# Patient Record
Sex: Female | Born: 1935 | Race: White | Hispanic: No | Smoking: Never smoker
Health system: Southern US, Community
[De-identification: ages and names within clinical notes are randomized; demographics above are authoritative.]

## PROBLEM LIST (undated history)

## (undated) DIAGNOSIS — E039 Hypothyroidism, unspecified: Secondary | ICD-10-CM

## (undated) DIAGNOSIS — I10 Essential (primary) hypertension: Secondary | ICD-10-CM

## (undated) DIAGNOSIS — I509 Heart failure, unspecified: Secondary | ICD-10-CM

## (undated) DIAGNOSIS — K219 Gastro-esophageal reflux disease without esophagitis: Secondary | ICD-10-CM

## (undated) DIAGNOSIS — N289 Disorder of kidney and ureter, unspecified: Secondary | ICD-10-CM

## (undated) HISTORY — PX: REPLACEMENT TOTAL KNEE: SUR1224

## (undated) HISTORY — PX: JOINT REPLACEMENT: SHX530

---

## 2010-08-13 ENCOUNTER — Emergency Department (HOSPITAL_BASED_OUTPATIENT_CLINIC_OR_DEPARTMENT_OTHER)
Admission: EM | Admit: 2010-08-13 | Discharge: 2010-08-13 | Payer: Self-pay | Source: Home / Self Care | Admitting: Emergency Medicine

## 2010-11-14 LAB — URINALYSIS, ROUTINE W REFLEX MICROSCOPIC
Bilirubin Urine: NEGATIVE
Glucose, UA: NEGATIVE mg/dL
Ketones, ur: NEGATIVE mg/dL
Nitrite: POSITIVE — AB
Protein, ur: NEGATIVE mg/dL
Urobilinogen, UA: 0.2 mg/dL (ref 0.0–1.0)
pH: 5.5 (ref 5.0–8.0)

## 2010-11-14 LAB — URINE CULTURE
Colony Count: >=100000
Culture  Setup Time: 201112120955

## 2011-05-09 ENCOUNTER — Encounter: Payer: Self-pay | Admitting: *Deleted

## 2011-05-09 ENCOUNTER — Other Ambulatory Visit: Payer: Self-pay

## 2011-05-09 ENCOUNTER — Emergency Department (HOSPITAL_BASED_OUTPATIENT_CLINIC_OR_DEPARTMENT_OTHER)
Admission: EM | Admit: 2011-05-09 | Discharge: 2011-05-09 | Disposition: A | Discharge status: Other Acute Inpatient Hospital | Payer: Medicare Other | Attending: Emergency Medicine | Admitting: Emergency Medicine

## 2011-05-09 ENCOUNTER — Emergency Department (INDEPENDENT_AMBULATORY_CARE_PROVIDER_SITE_OTHER): Payer: Medicare Other

## 2011-05-09 DIAGNOSIS — M545 Low back pain, unspecified: Secondary | ICD-10-CM | POA: Insufficient documentation

## 2011-05-09 DIAGNOSIS — R0989 Other specified symptoms and signs involving the circulatory and respiratory systems: Secondary | ICD-10-CM

## 2011-05-09 DIAGNOSIS — E039 Hypothyroidism, unspecified: Secondary | ICD-10-CM | POA: Insufficient documentation

## 2011-05-09 DIAGNOSIS — I10 Essential (primary) hypertension: Secondary | ICD-10-CM | POA: Insufficient documentation

## 2011-05-09 DIAGNOSIS — R6883 Chills (without fever): Secondary | ICD-10-CM

## 2011-05-09 DIAGNOSIS — R110 Nausea: Secondary | ICD-10-CM

## 2011-05-09 DIAGNOSIS — R109 Unspecified abdominal pain: Secondary | ICD-10-CM

## 2011-05-09 DIAGNOSIS — R509 Fever, unspecified: Secondary | ICD-10-CM

## 2011-05-09 DIAGNOSIS — N390 Urinary tract infection, site not specified: Secondary | ICD-10-CM | POA: Insufficient documentation

## 2011-05-09 DIAGNOSIS — K5730 Diverticulosis of large intestine without perforation or abscess without bleeding: Secondary | ICD-10-CM

## 2011-05-09 DIAGNOSIS — R05 Cough: Secondary | ICD-10-CM

## 2011-05-09 DIAGNOSIS — R1084 Generalized abdominal pain: Secondary | ICD-10-CM

## 2011-05-09 DIAGNOSIS — I517 Cardiomegaly: Secondary | ICD-10-CM

## 2011-05-09 HISTORY — DX: Essential (primary) hypertension: I10

## 2011-05-09 HISTORY — DX: Hypothyroidism, unspecified: E03.9

## 2011-05-09 LAB — URINALYSIS, ROUTINE W REFLEX MICROSCOPIC
Glucose, UA: NEGATIVE mg/dL
Hgb urine dipstick: NEGATIVE
Leukocytes, UA: NEGATIVE
Protein, ur: NEGATIVE mg/dL
Specific Gravity, Urine: 1.016 (ref 1.005–1.030)
Urobilinogen, UA: 0.2 mg/dL (ref 0.0–1.0)
pH: 5.5 (ref 5.0–8.0)

## 2011-05-09 LAB — COMPREHENSIVE METABOLIC PANEL
BUN: 27 mg/dL — ABNORMAL HIGH (ref 6–23)
Chloride: 100 mEq/L (ref 96–112)
GFR calc Af Amer: 31 mL/min — ABNORMAL LOW (ref >60)
Glucose, Bld: 90 mg/dL (ref 70–99)
Potassium: 4.9 mEq/L (ref 3.5–5.1)
Sodium: 134 mEq/L — ABNORMAL LOW (ref 135–145)
Total Bilirubin: 0.4 mg/dL (ref 0.3–1.2)
Total Protein: 7.9 g/dL (ref 6.0–8.3)

## 2011-05-09 LAB — CBC
Hemoglobin: 13 g/dL (ref 12.0–15.0)
MCH: 34.4 pg — ABNORMAL HIGH (ref 26.0–34.0)
MCHC: 33.2 g/dL (ref 30.0–36.0)
RBC: 3.78 MIL/uL — ABNORMAL LOW (ref 3.87–5.11)
RDW: 13.6 % (ref 11.5–15.5)

## 2011-05-09 LAB — LACTIC ACID, PLASMA: Lactic Acid, Venous: 0.7 mmol/L (ref 0.5–2.2)

## 2011-05-09 MED ORDER — ONDANSETRON HCL 4 MG/2ML IJ SOLN
INTRAMUSCULAR | Status: AC
  Filled 2011-05-09: qty 2

## 2011-05-09 MED ORDER — ONDANSETRON HCL 4 MG/2ML IJ SOLN: 4.0000 mg | Freq: Once | INTRAMUSCULAR | Status: DC

## 2011-05-09 MED ORDER — ONDANSETRON HCL 4 MG/2ML IJ SOLN
4.0000 mg | Freq: Once | INTRAMUSCULAR | Status: AC
  Administered 2011-05-09: 4 mg via INTRAVENOUS

## 2011-05-09 NOTE — ED Notes (Signed)
Pt and family aware of transfer and [facility] en route

## 2011-05-09 NOTE — ED Notes (Signed)
Pt to room 12 by ems via stretcher, daughter reports pt has long hx of uti, has taken many atbx for same, and that she starts taking the atbx, then stops when she feels better. Pt is a/a/o x3 in nad.  Pt reports her usual uti sx x Sunday, with dysuria, low grade temps and back pain.

## 2011-05-09 NOTE — ED Provider Notes (Addendum)
History     CSN: [id number] Arrival date & time: 05/09/2011  6:09 PM  Chief Complaint  Patient presents with  . Urinary Tract Infection   HPI Comments: Patient has history of hypertension, is presenting from nursing home with 3 days of not feeling well. Patient's family states she has recurrent UTIs and was last treated on August 28 patient stopped antibiotics prematurely. She feels like she is having a urinary tract infection. She has chills but no documented fever. She is a poor appetite with nausea but denies any vomiting or diarrhea. She denies any chest pain or shortness of breath. She has chronic lower extremity edema and erythema which she says is unchanged.  No chest pain or shortness of breath. She does have chronic low back pain it is also unchanged. There is no CVA tenderness. He has no bowel or bladder incontinence, weakness, numbness or tingling.  The history is provided by the patient.    Past Medical History  Diagnosis Date  . Hypothyroid   . Hypertension     Past Surgical History  Procedure Date  . Replacement total knee     History reviewed. No pertinent family history.  History  Substance Use Topics  . Smoking status: Not on file  . Smokeless tobacco: Not on file  . Alcohol Use:     OB History    Grav Para Term Preterm Abortions TAB SAB Ect Mult Living                  Review of Systems  Constitutional: Positive for chills, activity change, appetite change and fatigue. Negative for fever.  HENT: Negative for congestion and rhinorrhea.   Eyes: Negative.   Respiratory: Negative for cough, chest tightness and shortness of breath.   Cardiovascular: Negative for chest pain.  Gastrointestinal: Positive for nausea. Negative for vomiting and abdominal pain.  Genitourinary: Negative for dysuria, hematuria, vaginal bleeding and vaginal pain.  Musculoskeletal: Positive for myalgias, back pain and arthralgias.  Neurological: Negative for headaches.    Hematological: Negative.     Physical Exam  BP 186/62  Pulse 81  Temp(Src) 98.3 F (36.8 C) (Oral)  Resp 18  SpO2 99%  Physical Exam  Constitutional: She is oriented to person, place, and time. She appears well-developed and well-nourished. No distress.  HENT:  Head: Normocephalic and atraumatic.  Mouth/Throat: Oropharynx is clear and moist. No oropharyngeal exudate.  Eyes: Conjunctivae are normal. Pupils are equal, round, and reactive to light.  Neck: Normal range of motion.  Cardiovascular: Normal rate, regular rhythm and normal heart sounds.   Pulmonary/Chest: Effort normal and breath sounds normal. No respiratory distress.  Abdominal: Soft. There is tenderness. There is no rebound and no guarding.  Musculoskeletal: She exhibits edema and tenderness.       Bilateral lower extremity lymphedema with chronic venous stasis changes and erythema. +2 DP and PT pulses  Neurological: She is alert and oriented to person, place, and time. No cranial nerve deficit.  Skin: Skin is warm.    ED Course  Procedures  MDM Chills, weakness, nausea, abdominal pain.  Concern for UTI.  Mental status normal, vital signs normal.  UA appears benign, culture sent.  No metabolic abnormality.   Patient continues not to feel well, states "stomach churning" and mildly tender to palpation.  Will obtain CT.  Stranding around pancreas on CT, concern for pancreatitis or duodenitis.  Lipase wnl.  Patient still with abdominal pain, chills, feeling ill.  Vitals stable and appears nontoxic.  Mild ARF.  D/w hospitalist at [facility] for admission and observation.   Date: 05/09/2011  Rate: 77  Rhythm: normal sinus rhythm  QRS Axis: normal  Intervals: normal  ST/T Wave abnormalities: normal  Conduction Disutrbances:none  Narrative Interpretation:   Old EKG Reviewed: none available  Results for orders placed during the hospital encounter of 05/09/11  URINALYSIS, ROUTINE W REFLEX MICROSCOPIC       Component Value Range   Color, Urine YELLOW  YELLOW    Appearance CLEAR  CLEAR    Specific Gravity, Urine 1.016  1.005 - 1.030    pH 5.5  5.0 - 8.0    Glucose, UA NEGATIVE  NEGATIVE (mg/dL)   Hgb urine dipstick NEGATIVE  NEGATIVE    Bilirubin Urine NEGATIVE  NEGATIVE    Ketones, ur 15 (*) NEGATIVE (mg/dL)   Protein, ur NEGATIVE  NEGATIVE (mg/dL)   Urobilinogen, UA 0.2  0.0 - 1.0 (mg/dL)   Nitrite NEGATIVE  NEGATIVE    Leukocytes, UA NEGATIVE  NEGATIVE   CBC      Component Value Range   WBC 7.4  4.0 - 10.5 (K/uL)   RBC 3.78 (*) 3.87 - 5.11 (MIL/uL)   Hemoglobin 13.0  12.0 - 15.0 (g/dL)   HCT 39.1  36.0 - 46.0 (%)   MCV 103.4 (*) 78.0 - 100.0 (fL)   MCH 34.4 (*) 26.0 - 34.0 (pg)   MCHC 33.2  30.0 - 36.0 (g/dL)   RDW 13.6  11.5 - 15.5 (%)   Platelets 239  150 - 400 (K/uL)  COMPREHENSIVE METABOLIC PANEL      Component Value Range   Sodium 134 (*) 135 - 145 (mEq/L)   Potassium 4.9  3.5 - 5.1 (mEq/L)   Chloride 100  96 - 112 (mEq/L)   CO2 21  19 - 32 (mEq/L)   Glucose, Bld 90  70 - 99 (mg/dL)   BUN 27 (*) 6 - 23 (mg/dL)   Creatinine, Ser 1.90 (*) 0.50 - 1.10 (mg/dL)   Calcium 10.3  8.4 - 10.5 (mg/dL)   Total Protein 7.9  6.0 - 8.3 (g/dL)   Albumin 3.4 (*) 3.5 - 5.2 (g/dL)   AST 29  0 - 37 (U/L)   ALT 5  0 - 35 (U/L)   Alkaline Phosphatase 98  39 - 117 (U/L)   Total Bilirubin 0.4  0.3 - 1.2 (mg/dL)   GFR calc non Af Amer 25 (*) >60 (mL/min)   GFR calc Af Amer 31 (*) >60 (mL/min)  LACTIC ACID, PLASMA      Component Value Range   Lactic Acid, Venous 0.7  0.5 - 2.2 (mmol/L)  TROPONIN I      Component Value Range   Troponin I <0.30  <0.30 (ng/mL)  LIPASE, BLOOD      Component Value Range   Lipase 20  11 - 59 (U/L)   Ct Abdomen Pelvis Wo Contrast  05/09/2011  *RADIOLOGY REPORT*  Clinical Data: Fever, abdominal pain, nausea  CT ABDOMEN AND PELVIS WITHOUT CONTRAST  Technique:  Multidetector CT imaging of the abdomen and pelvis was performed following the standard protocol  without intravenous contrast.  Comparison: None.  Findings: Sagittal images of the spine shows diffuse osteopenia. Multilevel extensive degenerative changes thoracolumbar spine. Prior vertebral plasty noted T12 level.  The study is limited without IV and oral contrast. Enhanced liver, spleen and adrenal glands are unremarkable.  Unenhanced kidneys are symmetrical in size.  No nephrolithiasis.  No hydronephrosis or hydroureter.  No  calcified ureteral calculi are noted.  There is a moderate distended gallbladder without evidence of calcified gallstones.  In axial image 28 there is mild thickening of the duodenal wall.  There is stranding of the fat between pancreatic head and duodenum in axial image 27.  Mild stranding of the peri duodenal fat and retroperitoneal fat just inferior to the pancreatic head is noted in axial image 29. There is mild thickening of duodenal wall in axial image 30.  This is confirmed in the sagittal image 65.  Findings may be due to duodenitis, ulcer disease, pancreatic head or groove pancreatitis.  Clinical correlation is necessary.  No mesenteric or retroperitoneal fluid collection is noted.  Atherosclerotic calcifications of the abdominal aorta and the iliac arteries are noted.  No small bowel obstruction.  No free abdominal air.  Multiple sigmoid colon diverticula are noted without evidence of acute diverticulitis.  There is no pericecal inflammation.  The uterus is atrophic.  The urinary bladder is unremarkable.  IMPRESSION:  1.There is stranding of the fat between pancreatic head and duodenum in axial image 27.  Mild stranding of the peri duodenal fat and retroperitoneal fat just inferior to the pancreatic head is noted in axial image 29.  This is confirmed in the sagittal image 65.  Findings may be due to duodenitis, ulcer disease, pancreatic head or groove pancreatitis.  Clinical correlation is necessary. No mesenteric or retroperitoneal fluid collection is noted. 2.  No  nephrolithiasis.  No hydronephrosis or hydroureter. 3.  Extensive generative changes thoracolumbar spine. 4.  Sigmoid colon diverticula are noted without evidence of acute diverticulitis. 5.  Moderate distended gallbladder without evidence of calcified gallstones.  Original Report Authenticated By: Lahoma Crocker, M.D.   Dg Chest 2 View  05/09/2011  *RADIOLOGY REPORT*  Clinical Data: Fever, cough, congestion  CHEST - 2 VIEW  Comparison: 08/13/2010  Findings: Cardiomegaly again noted.  No acute infiltrate or edema. Osteopenia and degenerative changes thoracic spine again noted. Again noted prior vertebroplasty lower thoracic spine.  IMPRESSION: No active disease.  No significant change.  Original Report Authenticated By: Lahoma Crocker, M.D.            Ezequiel Essex, MD 05/10/11 [place], MD 05/16/11 [phone]

## 2011-05-09 NOTE — ED Notes (Signed)
Call placed to [facility] hospitalist

## 2011-05-09 NOTE — ED Notes (Signed)
Pt states she was brought to ED for c/o nausea-pt felt warm to touch-rectal temp taken 98.4

## 2011-05-10 LAB — URINE CULTURE

## 2011-05-15 LAB — CULTURE, BLOOD (ROUTINE X 2)
Culture  Setup Time: 201209052357
Culture  Setup Time: 201209052357

## 2019-09-20 ENCOUNTER — Emergency Department (HOSPITAL_COMMUNITY): Payer: Medicare Other

## 2019-09-20 ENCOUNTER — Inpatient Hospital Stay (HOSPITAL_COMMUNITY)
Admission: EM | Admit: 2019-09-20 | Discharge: 2019-09-28 | DRG: 177 | Disposition: A | Discharge status: Skilled Nursing Facility | Payer: Medicare Other | Source: Skilled Nursing Facility | Attending: Internal Medicine | Admitting: Internal Medicine

## 2019-09-20 ENCOUNTER — Encounter (HOSPITAL_COMMUNITY): Payer: Self-pay | Admitting: Family Medicine

## 2019-09-20 ENCOUNTER — Other Ambulatory Visit: Payer: Self-pay

## 2019-09-20 DIAGNOSIS — Z8616 Personal history of COVID-19: Secondary | ICD-10-CM | POA: Diagnosis present

## 2019-09-20 DIAGNOSIS — M79662 Pain in left lower leg: Secondary | ICD-10-CM | POA: Diagnosis not present

## 2019-09-20 DIAGNOSIS — E039 Hypothyroidism, unspecified: Secondary | ICD-10-CM | POA: Diagnosis present

## 2019-09-20 DIAGNOSIS — N179 Acute kidney failure, unspecified: Secondary | ICD-10-CM | POA: Diagnosis present

## 2019-09-20 DIAGNOSIS — D72828 Other elevated white blood cell count: Secondary | ICD-10-CM | POA: Diagnosis present

## 2019-09-20 DIAGNOSIS — Z9981 Dependence on supplemental oxygen: Secondary | ICD-10-CM

## 2019-09-20 DIAGNOSIS — R0602 Shortness of breath: Secondary | ICD-10-CM | POA: Diagnosis present

## 2019-09-20 DIAGNOSIS — E875 Hyperkalemia: Secondary | ICD-10-CM | POA: Diagnosis present

## 2019-09-20 DIAGNOSIS — J189 Pneumonia, unspecified organism: Secondary | ICD-10-CM | POA: Diagnosis not present

## 2019-09-20 DIAGNOSIS — Z79890 Hormone replacement therapy (postmenopausal): Secondary | ICD-10-CM

## 2019-09-20 DIAGNOSIS — Z86711 Personal history of pulmonary embolism: Secondary | ICD-10-CM | POA: Diagnosis present

## 2019-09-20 DIAGNOSIS — I130 Hypertensive heart and chronic kidney disease with heart failure and stage 1 through stage 4 chronic kidney disease, or unspecified chronic kidney disease: Secondary | ICD-10-CM | POA: Diagnosis present

## 2019-09-20 DIAGNOSIS — T380X5A Adverse effect of glucocorticoids and synthetic analogues, initial encounter: Secondary | ICD-10-CM | POA: Diagnosis present

## 2019-09-20 DIAGNOSIS — D649 Anemia, unspecified: Secondary | ICD-10-CM | POA: Diagnosis present

## 2019-09-20 DIAGNOSIS — Z79899 Other long term (current) drug therapy: Secondary | ICD-10-CM

## 2019-09-20 DIAGNOSIS — Z880 Allergy status to penicillin: Secondary | ICD-10-CM

## 2019-09-20 DIAGNOSIS — N898 Other specified noninflammatory disorders of vagina: Secondary | ICD-10-CM | POA: Diagnosis present

## 2019-09-20 DIAGNOSIS — Z66 Do not resuscitate: Secondary | ICD-10-CM | POA: Diagnosis present

## 2019-09-20 DIAGNOSIS — U071 COVID-19: Principal | ICD-10-CM | POA: Diagnosis present

## 2019-09-20 DIAGNOSIS — Z87440 Personal history of urinary (tract) infections: Secondary | ICD-10-CM | POA: Diagnosis not present

## 2019-09-20 DIAGNOSIS — I2699 Other pulmonary embolism without acute cor pulmonale: Secondary | ICD-10-CM | POA: Diagnosis present

## 2019-09-20 DIAGNOSIS — Z885 Allergy status to narcotic agent status: Secondary | ICD-10-CM

## 2019-09-20 DIAGNOSIS — J9600 Acute respiratory failure, unspecified whether with hypoxia or hypercapnia: Secondary | ICD-10-CM | POA: Diagnosis present

## 2019-09-20 DIAGNOSIS — J9601 Acute respiratory failure with hypoxia: Secondary | ICD-10-CM | POA: Diagnosis present

## 2019-09-20 DIAGNOSIS — R0902 Hypoxemia: Secondary | ICD-10-CM

## 2019-09-20 DIAGNOSIS — N170 Acute kidney failure with tubular necrosis: Secondary | ICD-10-CM | POA: Diagnosis not present

## 2019-09-20 DIAGNOSIS — R7989 Other specified abnormal findings of blood chemistry: Secondary | ICD-10-CM | POA: Diagnosis not present

## 2019-09-20 DIAGNOSIS — N184 Chronic kidney disease, stage 4 (severe): Secondary | ICD-10-CM | POA: Diagnosis present

## 2019-09-20 DIAGNOSIS — Z7982 Long term (current) use of aspirin: Secondary | ICD-10-CM

## 2019-09-20 DIAGNOSIS — N390 Urinary tract infection, site not specified: Secondary | ICD-10-CM | POA: Diagnosis present

## 2019-09-20 DIAGNOSIS — Z96652 Presence of left artificial knee joint: Secondary | ICD-10-CM | POA: Diagnosis present

## 2019-09-20 DIAGNOSIS — N189 Chronic kidney disease, unspecified: Secondary | ICD-10-CM | POA: Diagnosis present

## 2019-09-20 DIAGNOSIS — I5032 Chronic diastolic (congestive) heart failure: Secondary | ICD-10-CM | POA: Diagnosis present

## 2019-09-20 DIAGNOSIS — M79661 Pain in right lower leg: Secondary | ICD-10-CM | POA: Diagnosis not present

## 2019-09-20 DIAGNOSIS — I959 Hypotension, unspecified: Secondary | ICD-10-CM | POA: Diagnosis present

## 2019-09-20 DIAGNOSIS — I10 Essential (primary) hypertension: Secondary | ICD-10-CM | POA: Diagnosis present

## 2019-09-20 DIAGNOSIS — J1282 Pneumonia due to coronavirus disease 2019: Secondary | ICD-10-CM | POA: Diagnosis present

## 2019-09-20 DIAGNOSIS — Z881 Allergy status to other antibiotic agents status: Secondary | ICD-10-CM

## 2019-09-20 LAB — URINALYSIS, ROUTINE W REFLEX MICROSCOPIC
Bilirubin Urine: NEGATIVE
Glucose, UA: NEGATIVE mg/dL
Hgb urine dipstick: NEGATIVE
Ketones, ur: NEGATIVE mg/dL
Nitrite: NEGATIVE
Protein, ur: NEGATIVE mg/dL
Specific Gravity, Urine: 1.013 (ref 1.005–1.030)
WBC, UA: >50 WBC/hpf — ABNORMAL HIGH (ref 0–5)
pH: 5 (ref 5.0–8.0)

## 2019-09-20 LAB — COMPREHENSIVE METABOLIC PANEL
ALT: 14 U/L (ref 0–44)
AST: 20 U/L (ref 15–41)
Albumin: 3 g/dL — ABNORMAL LOW (ref 3.5–5.0)
Alkaline Phosphatase: 72 U/L (ref 38–126)
Anion gap: 9 (ref 5–15)
BUN: 87 mg/dL — ABNORMAL HIGH (ref 8–23)
CO2: 21 mmol/L — ABNORMAL LOW (ref 22–32)
Calcium: 8.8 mg/dL — ABNORMAL LOW (ref 8.9–10.3)
Chloride: 103 mmol/L (ref 98–111)
Creatinine, Ser: 4.12 mg/dL — ABNORMAL HIGH (ref 0.44–1.00)
GFR calc Af Amer: 10 mL/min — ABNORMAL LOW (ref >60)
GFR calc non Af Amer: 9 mL/min — ABNORMAL LOW (ref >60)
Glucose, Bld: 131 mg/dL — ABNORMAL HIGH (ref 70–99)
Potassium: 5.9 mmol/L — ABNORMAL HIGH (ref 3.5–5.1)
Sodium: 133 mmol/L — ABNORMAL LOW (ref 135–145)
Total Bilirubin: 0.4 mg/dL (ref 0.3–1.2)
Total Protein: 6.6 g/dL (ref 6.5–8.1)

## 2019-09-20 LAB — I-STAT CHEM 8, ED
BUN: 86 mg/dL — ABNORMAL HIGH (ref 8–23)
Calcium, Ion: 1.22 mmol/L (ref 1.15–1.40)
Chloride: 105 mmol/L (ref 98–111)
Creatinine, Ser: 4.5 mg/dL — ABNORMAL HIGH (ref 0.44–1.00)
Glucose, Bld: 126 mg/dL — ABNORMAL HIGH (ref 70–99)
HCT: 34 % — ABNORMAL LOW (ref 36.0–46.0)
Hemoglobin: 11.6 g/dL — ABNORMAL LOW (ref 12.0–15.0)
Potassium: 5.9 mmol/L — ABNORMAL HIGH (ref 3.5–5.1)
Sodium: 134 mmol/L — ABNORMAL LOW (ref 135–145)
TCO2: 24 mmol/L (ref 22–32)

## 2019-09-20 LAB — BLOOD GAS, VENOUS
Acid-base deficit: 2.4 mmol/L — ABNORMAL HIGH (ref 0.0–2.0)
Bicarbonate: 22.2 mmol/L (ref 20.0–28.0)
O2 Saturation: 85.6 %
Patient temperature: 98.6
pCO2, Ven: 40 mmHg — ABNORMAL LOW (ref 44.0–60.0)
pH, Ven: 7.364 (ref 7.250–7.430)
pO2, Ven: 49.2 mmHg — ABNORMAL HIGH (ref 32.0–45.0)

## 2019-09-20 LAB — CBC WITH DIFFERENTIAL/PLATELET
Abs Immature Granulocytes: 0.09 10*3/uL — ABNORMAL HIGH (ref 0.00–0.07)
Basophils Absolute: 0 10*3/uL (ref 0.0–0.1)
Basophils Relative: 0 %
Eosinophils Absolute: 0 10*3/uL (ref 0.0–0.5)
Eosinophils Relative: 0 %
HCT: 35.6 % — ABNORMAL LOW (ref 36.0–46.0)
Hemoglobin: 11.3 g/dL — ABNORMAL LOW (ref 12.0–15.0)
Immature Granulocytes: 1 %
Lymphocytes Relative: 4 %
Lymphs Abs: 0.4 10*3/uL — ABNORMAL LOW (ref 0.7–4.0)
MCH: 30.5 pg (ref 26.0–34.0)
MCHC: 31.7 g/dL (ref 30.0–36.0)
MCV: 96 fL (ref 80.0–100.0)
Monocytes Absolute: 0.2 10*3/uL (ref 0.1–1.0)
Monocytes Relative: 2 %
Neutro Abs: 9.9 10*3/uL — ABNORMAL HIGH (ref 1.7–7.7)
Neutrophils Relative %: 93 %
Platelets: 304 10*3/uL (ref 150–400)
RBC: 3.71 MIL/uL — ABNORMAL LOW (ref 3.87–5.11)
RDW: 14.5 % (ref 11.5–15.5)
WBC: 10.6 10*3/uL — ABNORMAL HIGH (ref 4.0–10.5)
nRBC: 0 % (ref 0.0–0.2)

## 2019-09-20 LAB — PROCALCITONIN: Procalcitonin: 1.33 ng/mL

## 2019-09-20 LAB — FERRITIN: Ferritin: 189 ng/mL (ref 11–307)

## 2019-09-20 LAB — D-DIMER, QUANTITATIVE: D-Dimer, Quant: 8.95 ug/mL-FEU — ABNORMAL HIGH (ref 0.00–0.50)

## 2019-09-20 LAB — TROPONIN I (HIGH SENSITIVITY): Troponin I (High Sensitivity): 14 ng/L (ref <18)

## 2019-09-20 LAB — C-REACTIVE PROTEIN: CRP: 4.7 mg/dL — ABNORMAL HIGH (ref <1.0)

## 2019-09-20 LAB — POC SARS CORONAVIRUS 2 AG -  ED: SARS Coronavirus 2 Ag: NEGATIVE

## 2019-09-20 LAB — TRIGLYCERIDES: Triglycerides: 126 mg/dL (ref <150)

## 2019-09-20 LAB — FIBRINOGEN: Fibrinogen: 424 mg/dL (ref 210–475)

## 2019-09-20 LAB — LACTIC ACID, PLASMA: Lactic Acid, Venous: 0.9 mmol/L (ref 0.5–1.9)

## 2019-09-20 LAB — LACTATE DEHYDROGENASE: LDH: 168 U/L (ref 98–192)

## 2019-09-20 MED ORDER — SODIUM CHLORIDE 0.9 % IV SOLN
1.0000 g | Freq: Three times a day (TID) | INTRAVENOUS | Status: DC
  Administered 2019-09-20: 1 g via INTRAVENOUS
  Filled 2019-09-20 (×2): qty 1

## 2019-09-20 MED ORDER — SODIUM ZIRCONIUM CYCLOSILICATE 5 G PO PACK
5.0000 g | PACK | Freq: Once | ORAL | Status: AC
  Administered 2019-09-20: 5 g via ORAL
  Filled 2019-09-20: qty 1

## 2019-09-20 MED ORDER — HEPARIN SODIUM (PORCINE) 5000 UNIT/ML IJ SOLN
7500.0000 [IU] | Freq: Three times a day (TID) | INTRAMUSCULAR | Status: DC
  Administered 2019-09-21 – 2019-09-22 (×3): 7500 [IU] via SUBCUTANEOUS
  Filled 2019-09-20 (×3): qty 2

## 2019-09-20 MED ORDER — SODIUM CHLORIDE 0.9 % IV BOLUS: 500.0000 mL | Freq: Once | INTRAVENOUS | Status: DC

## 2019-09-20 MED ORDER — HYDROCODONE-ACETAMINOPHEN 5-325 MG PO TABS
1.0000 | ORAL_TABLET | Freq: Once | ORAL | Status: AC
  Administered 2019-09-20: 1 via ORAL
  Filled 2019-09-20: qty 1

## 2019-09-20 MED ORDER — HYDROCORTISONE NA SUCCINATE PF 100 MG IJ SOLR
100.0000 mg | Freq: Once | INTRAMUSCULAR | Status: AC
  Administered 2019-09-21: 100 mg via INTRAVENOUS
  Filled 2019-09-20: qty 2

## 2019-09-20 MED ORDER — SODIUM CHLORIDE 0.9 % IV BOLUS
1000.0000 mL | Freq: Once | INTRAVENOUS | Status: AC
  Administered 2019-09-20: 1000 mL via INTRAVENOUS

## 2019-09-20 NOTE — ED Triage Notes (Signed)
Per EMS - Pt coming from SNF with CC of COVID + (approx 12-14 days confirmed) and SOB. When EMS arrived pt was at 90% on RA and was placed on NRB @ 8L and now is at 97%. Pt complains of "generally not feeling well".   97.6 114 50 70 HR 14R 97% on 8L NRB

## 2019-09-20 NOTE — H&P (Signed)
History and Physical    Ozzie Mortellaro [id number] DOB: November 14, 1926 DOA: 09/20/2019  PCP: Patient, No Pcp Per   Patient coming from: [facility] facility   Chief Complaint: Malaise, fatigue, recent COVID with hypoxia, UTI   HPI: Beloved Cobia is a 86 y.o. female with medical history significant for chronic diastolic CHF, hypertension, hypothyroidism, chronic kidney disease, now presenting from her nursing facility for evaluation of malaise.  Patient was reportedly diagnosed with COVID-19 recently (more than a month ago per patient, less than a week ago per her daughter, and roughly 2 weeks ago per nursing home personnel), was started on 2 to 3 L of supplemental oxygen and steroids at the nursing home, was receiving antibiotics for a recurrent UTI, initially nitrofurantoin and then changed to [place] within the past 3 days.  Patient reports that her chronic leg swelling has recently resolved though she is having tenderness involving the bilateral legs.  She denies any chest pain, denies hemoptysis, and reports frequent cough that is only occasionally productive of scant sputum.  Suprapubic pain and dysuria has improved on the new antibiotic.  She was having diarrhea recently but reports that that has resolved.  ED Course: Upon arrival to the ED, patient is found to be afebrile, saturating mid 90s on 3 L/min of supplemental oxygen, slightly tachypneic, and with blood pressure 100/30.  EKG features sinus rhythm with short PR interval.  Chest x-ray with bibasilar opacities, left greater than right, concerning for pneumonia, as well as hazy interstitial opacities that could reflect concomitant edema.  Chemistry panel features a potassium of 5.9, bicarbonate 21, BUN 87, and creatinine 4.12.  Procalcitonin is elevated to 1.33, Covid antigen negative, and D-dimer 8.95.  CBC with mild leukocytosis and anemia.  Lactic acid reassuringly normal.  Blood and urine cultures were ordered, Covid PCR was  ordered, and the patient was given a liter of normal saline, Norco, Lokelma, and meropenem in the ED.  ED physician discussed the case with nephrology who advised that patient is a poor dialysis candidate and should be treated conservatively at [facility].  Review of Systems:  All other systems reviewed and apart from HPI, are negative.  Past Medical History:  Diagnosis Date  . Hypertension   . Hypothyroid     Past Surgical History:  Procedure Laterality Date  . REPLACEMENT TOTAL KNEE       has no history on file for tobacco, alcohol, and drug.  Allergies  Allergen Reactions  . Morphine And Related   . Cephalexin Hives, Swelling and Rash  . Penicillins Swelling and Rash    Did it involve swelling of the face/tongue/throat, SOB, or low BP? Unknown Did it involve sudden or severe rash/hives, skin peeling, or any reaction on the inside of your mouth or nose? Unknown Did you need to seek medical attention at a hospital or doctor's office? Unknown When did it last happen? Unknown If all above answers are "NO", may proceed with cephalosporin use.     History reviewed. No pertinent family history.   Prior to Admission medications   Medication Sig Start Date End Date Taking? Authorizing Provider  acetaminophen (TYLENOL) 325 MG tablet Take 650 mg by mouth every 4 (four) hours as needed for fever.   Yes [provider]  amLODipine (NORVASC) 5 MG tablet Take 5 mg by mouth daily. 09/15/19  Yes [provider]  ascorbic acid (VITAMIN C) 500 MG tablet Take 500 mg by mouth daily. 09/09/19 09/22/19 Yes [provider]  aspirin EC 81 MG tablet Take 81 mg by mouth every evening. 09/09/19 09/22/19 Yes [provider]  AZO-CRANBERRY PO Take 1 tablet by mouth 2 (two) times daily. AZO cranberry + Probiotic 250mg -30mg -50 million cell   Yes [provider]  b complex vitamins tablet Take 1 tablet by mouth daily. 09/09/19 09/22/19 Yes [provider]  calcium-vitamin D (OS-CAL 500 + D) 500-200 MG-UNIT per tablet Take 1 tablet by mouth daily.     Yes [provider]  Cholecalciferol (VITAMIN D3) 50 MCG (2000 UT) TABS Take 50 mcg by mouth daily.   Yes [provider]  clindamycin (CLEOCIN) 150 MG capsule Take 600 mg by mouth See admin instructions. 600 mg PO x1 one hour prior to all dental appointments   Yes [provider]  Febuxostat 80 MG TABS Take 80 mg by mouth daily. 09/15/19  Yes [provider]  hydrALAZINE (APRESOLINE) 25 MG tablet Take 25 mg by mouth 3 (three) times daily.   Yes [provider]  HYDROcodone-acetaminophen (NORCO/VICODIN) 5-325 MG tablet Take 1 tablet by mouth 3 (three) times daily. 09/14/19  Yes [provider]  isosorbide dinitrate (ISORDIL) 20 MG tablet Take 40 mg by mouth 2 (two) times daily. 800 and 2130 08/07/19  Yes [provider]  isosorbide dinitrate (ISORDIL) 20 MG tablet Take 20 mg by mouth daily. Midday (1330) 05/20/19  Yes [provider]  lactobacillus acidophilus (BACID) TABS tablet Take 2 tablets by mouth 2 (two) times daily with a meal.   Yes [provider]  levothyroxine (SYNTHROID, LEVOTHROID) 75 MCG tablet Take 75 mcg by mouth daily.     Yes [provider]  LORazepam (ATIVAN) 0.5 MG tablet Take 0.5 mg by mouth daily.   Yes [provider]  metoprolol succinate (TOPROL-XL) 25 MG 24 hr tablet Take 25 mg by mouth daily.   Yes [provider]  multivitamin-iron-minerals-folic acid (CENTRUM) chewable tablet Chew 1 tablet by mouth daily.   Yes [provider]  neomycin-polymyxin b-dexamethasone (MAXITROL) 3.5-10000-0.1 OINT Place 1 application into the right eye at bedtime as needed (eye swelling, eye infection).   Yes [provider]  omeprazole (PRILOSEC) 20 MG capsule Take 20 mg by mouth daily. 08/17/19  Yes [provider]  phenylephrine-shark liver  oil-mineral oil-petrolatum (PREPARATION H) 0.25-14-74.9 % rectal ointment Place 1 application rectally 4 (four) times daily as needed for hemorrhoids.   Yes [provider]  polyethylene glycol (MIRALAX / GLYCOLAX) 17 g packet Take 17 g by mouth daily as needed.   Yes [provider]  potassium chloride (KLOR-CON) 10 MEQ tablet Take 10 mEq by mouth daily.   Yes [provider]  predniSONE (DELTASONE) 50 MG tablet Take 50 mg by mouth every evening. 09/18/19 09/22/19 Yes [provider]  senna-docusate (SENNA S) 8.6-50 MG tablet Take 1 tablet by mouth 2 (two) times daily.   Yes [provider]  sulfamethoxazole-trimethoprim (BACTRIM) 400-80 MG tablet Take 1 tablet by mouth 2 (two) times daily. 09/18/19 09/25/19 Yes [provider]  torsemide (DEMADEX) 10 MG tablet Take 10 mg by mouth See admin instructions. 10 mg PO five times weekly (Tuesdays, Wednesdays, Fridays, Saturdays, and Sundays) 08/17/19  Yes [provider]  torsemide (DEMADEX) 20 MG tablet Take 20 mg by mouth 2 (two) times a week. On Mondays and Thursdays 09/15/19  Yes [provider]  traZODone (DESYREL) 50 MG tablet Take 50 mg by mouth at bedtime.   Yes  [provider]  zinc gluconate 50 MG tablet Take 50 mg by mouth daily. 09/09/19 09/22/19 Yes [provider]  nitrofurantoin, macrocrystal-monohydrate, (MACROBID) 100 MG capsule Take 100 mg by mouth 2 (two) times daily. 09/15/19   [provider]    Physical Exam: Vitals:   09/20/19 2100 09/20/19 2208 09/20/19 2230 09/20/19 2300  BP: (!) 103/35 (!) 103/43 (!) 98/45 (!) 94/42  Pulse: 70 85 68 64  Resp: (!) 21 (!) 24 19 17   Temp:      TempSrc:      SpO2: 95% 91% 96% 97%    Constitutional: NAD, calm  Eyes: PERTLA, lids and conjunctivae normal ENMT: Mucous membranes are moist. Posterior pharynx clear of any exudate or lesions.   Neck: normal, supple, no masses, no thyromegaly Respiratory:  no  wheezing, no crackles. Speaking full sentences.  Cardiovascular: S1 & S2 heard, regular rate and rhythm. No extremity edema.  Abdomen: No distension, no tenderness, soft. Bowel sounds active.  Musculoskeletal: no clubbing / cyanosis. No joint deformity upper and lower extremities.   Skin: no significant rashes, lesions, ulcers. Warm, dry, well-perfused. Neurologic: No facial asymmetry. Sensation intact. Moving all extremities.  Psychiatric: Alert and oriented to person, place, and situation. Pleasant and cooperative.    Labs and Imaging on Admission: I have personally reviewed following labs and imaging studies  CBC: Recent Labs  Lab 09/20/19 2121 09/20/19 2144  WBC 10.6*  --   NEUTROABS 9.9*  --   HGB 11.3* 11.6*  HCT 35.6* 34.0*  MCV 96.0  --   PLT 304  --    Basic Metabolic Panel: Recent Labs  Lab 09/20/19 2121 09/20/19 2144  NA 133* 134*  K 5.9* 5.9*  CL 103 105  CO2 21*  --   GLUCOSE 131* 126*  BUN 87* 86*  CREATININE 4.12* 4.50*  CALCIUM 8.8*  --    GFR: CrCl cannot be calculated (Unknown ideal weight.). Liver Function Tests: Recent Labs  Lab 09/20/19 2121  AST 20  ALT 14  ALKPHOS 72  BILITOT 0.4  PROT 6.6  ALBUMIN 3.0*   No results for input(s): LIPASE, AMYLASE in the last 168 hours. No results for input(s): AMMONIA in the last 168 hours. Coagulation Profile: No results for input(s): INR, PROTIME in the last 168 hours. Cardiac Enzymes: No results for input(s): CKTOTAL, CKMB, CKMBINDEX, TROPONINI in the last 168 hours. BNP (last 3 results) No results for input(s): PROBNP in the last 8760 hours. HbA1C: No results for input(s): HGBA1C in the last 72 hours. CBG: No results for input(s): GLUCAP in the last 168 hours. Lipid Profile: Recent Labs    09/20/19 2121  TRIG 126   Thyroid Function Tests: No results for input(s): TSH, T4TOTAL, FREET4, T3FREE, THYROIDAB in the last 72 hours. Anemia Panel: Recent Labs    09/20/19 2121  FERRITIN 189    Urine analysis:    Component Value Date/Time   COLORURINE AMBER (A) 09/20/2019 2100   APPEARANCEUR HAZY (A) 09/20/2019 2100   LABSPEC 1.013 09/20/2019 2100   PHURINE 5.0 09/20/2019 2100   GLUCOSEU NEGATIVE 09/20/2019 2100   HGBUR NEGATIVE 09/20/2019 2100   [place] NEGATIVE 09/20/2019 2100   [place] NEGATIVE 09/20/2019 2100   PROTEINUR NEGATIVE 09/20/2019 2100   UROBILINOGEN 0.2 05/09/2011 1918   NITRITE NEGATIVE 09/20/2019 2100   LEUKOCYTESUR LARGE (A) 09/20/2019 2100   Sepsis Labs: @LABRCNTIP (procalcitonin:4,lacticidven:4) )No results found for this or any previous visit (from the past 240 hour(s)).   Radiological Exams on  Admission: DG Chest Port 1 View  Result Date: 09/20/2019 CLINICAL DATA:  Shortness of breath, COVID-19 positive EXAM: PORTABLE CHEST 1 VIEW COMPARISON:  Radiograph 05/09/2011 FINDINGS: Patchy right basilar and dense retrocardiac opacities. Obscuration of left hemidiaphragm may reflect basilar consolidation or effusion. No visible pneumothorax. Biapical pleuroparenchymal scarring is seen. There is some cephalized vascularity with hazy interstitial opacities suggesting pulmonary edema. Tracheal tortuosity is similar to prior. The aorta is calcified. The remaining cardiomediastinal contours are unremarkable. No acute osseous or soft tissue abnormality. Degenerative changes are present in the imaged spine and shoulders. Telemetry leads and support devices overlie the chest. IMPRESSION: 1. Bibasilar opacities, left greater than right, worrisome for pneumonia in the setting of COVID-19. 2. Pulmonary vascular congestion and hazy interstitial opacities suggesting some concomitant edema. 3. Biapical pleuroparenchymal scarring. 4.  Aortic Atherosclerosis (ICD10-I70.0). Electronically Signed   By: Lovena Le M.D.   On: 09/20/2019 21:06    EKG: Independently reviewed. Sinus rhythm, short PR interval.   Assessment/Plan   1. Acute kidney injury superimposed on CKD  stage IV; hyperkalemia  - Presents from nursing home malaise, reporting recent COVID-19 diagnosis, currently on abx for UTI, and is found to have BUN of 87 with SCr of 4.12 and potassium 5.9  - Chart notes indicate BUN 47 and SCr 1.93 one year ago   - She recently had diarrhea, had been on Bactrim, and reports recent resolution in chronic leg edema  - ED physician discussed with nephrology who advised that patient poor HD candidate and should be [facility] with conserv[facility] at [facility]  - She was given a liter of NS and Lokelma in ED  - Continue cardiac monitoring, monitor potassium levels, continue IVF hydration, check urine chemistries, renally-dose medications, avoid nephrotoxins, repeat serum chemistries in am    2. Pneumonia; hypoxic respiratory failure; reported COVID-19  - Presents from SNF where she was reportedly diagnosed with COVID-19 though results not available; pt believes diagnosis was > 1 month ago, her daughter states <1 wk ago, and SNF personnel reported ~2 wks ago  - She has been using 2-3 Lpm supplemental O2 recently at the SNF  - CXR findings concerning for PNA, she does not appear to be in any distress on 3 Lpm in ED   - Check sputum culture, strep pneumo and legionella antigens, continue empiric antibiotic, and continue isolation for now while trying to clarify timeline of COVID-19 diagnosis   3. Recurrent UTI  - She has had recurrent UTI's, has seen urology outpatient, per SNF paperwork, appears she was recently on nitrofurantoin and then switched to Bactrim  - She has penicillin and cephalosporin allergy listed and was treated with meropenem in ED  - Discussed with pharmacy, will continue meropenem for now   4. Elevated d-dimer  - D-dimer is 8.95 in ED  - She denies chest pain or hemoptysis and hypoxia can be explained by suspected PNA and edema on CXR but she complains of bilateral leg tenderness  - Check LE dopplers, consider V/Q, continue sq heparin 7500 units q8h for now     5. Hypothyroidism  - Continue Synthroid    6. Hypotension; history of HTN  - Patient has been hypotensive in ED though tolerating well, seems asymptomatic  - She was given a liter of NS in ED, antihypertensives and diuretic will be held, antibiotics continued  - She is on prednisone 50 qD, unclear how long she has been on this, and with hyperkalemia, she will be given 100 mg hydrocortisone  now and am cortisol will be assessed   7. Chronic diastolic CHF  - Appears compensated and reports that chronic leg swelling recently resolved though CXR findings concerning for mild edema  - Hold diuretics, follow daily wt and I/O's     DVT prophylaxis: sq heparin  Code Status: DNR, confirmed with patient  Family Communication: Izell Klondike (daughter and POA) updated by phone  Consults called: ED physician discussed with nephrology, not formally consulted  Admission status: Inpatient. Patient critically-ill with multiple acute problems including renal failure with hyperkalemia    Vianne Bulls, MD [facility] Pager: See [email or web address]  If 7AM-7PM, please contact the daytime attending [email or web address]  09/20/2019, 11:39 PM

## 2019-09-20 NOTE — ED Provider Notes (Signed)
[facility] DEPT Provider Note   CSN: [id number] Arrival date & time: 09/20/19  2005     History Chief Complaint  Patient presents with  . Shortness of Breath  . COVID + Symptoms    Roniah Paguio is a 86 y.o. female hx of HTN, hypothyroidism, here presenting with shortness of breath.  Patient was diagnosed with Covid about 14 days ago.  Patient was doing well until last 2 to 3 days .She states that she has been having chills and myalgias has shortness of breath.  She was noted to be about 90% on room air and was put on nonrebreather.  She states that she has some dysuria and she has a history of frequent urinary tract infections as well.  The history is provided by the patient.       Past Medical History:  Diagnosis Date  . Hypertension   . Hypothyroid     Patient Active Problem List   Diagnosis Date Noted  . Hyperkalemia 09/20/2019  . Acute renal failure superimposed on chronic kidney disease ([place]) 09/20/2019  . Pneumonia 09/20/2019  . Recurrent UTI 09/20/2019  . Hypothyroid   . Acute respiratory failure with hypoxia ([place])   . Hypertension   . Chronic diastolic CHF (congestive heart failure) ([place])   . Positive D dimer     Past Surgical History:  Procedure Laterality Date  . REPLACEMENT TOTAL KNEE       OB History   No obstetric history on file.     History reviewed. No pertinent family history.  Social History   Tobacco Use  . Smoking status: Not on file  Substance Use Topics  . Alcohol use: Not on file  . Drug use: Not on file    Home Medications Prior to Admission medications   Medication Sig Start Date End Date Taking? Authorizing Provider  acetaminophen (TYLENOL) 325 MG tablet Take 650 mg by mouth every 4 (four) hours as needed for fever.   Yes [provider]  amLODipine (NORVASC) 5 MG tablet Take 5 mg by mouth daily. 09/15/19  Yes [provider]  ascorbic acid (VITAMIN C) 500 MG tablet Take 500  mg by mouth daily. 09/09/19 09/22/19 Yes [provider]  aspirin EC 81 MG tablet Take 81 mg by mouth every evening. 09/09/19 09/22/19 Yes [provider]  AZO-CRANBERRY PO Take 1 tablet by mouth 2 (two) times daily. AZO cranberry + Probiotic 250mg -30mg -50 million cell   Yes [provider]  b complex vitamins tablet Take 1 tablet by mouth daily. 09/09/19 09/22/19 Yes [provider]  calcium-vitamin D (OS-CAL 500 + D) 500-200 MG-UNIT per tablet Take 1 tablet by mouth daily.     Yes [provider]  Cholecalciferol (VITAMIN D3) 50 MCG (2000 UT) TABS Take 50 mcg by mouth daily.   Yes [provider]  clindamycin (CLEOCIN) 150 MG capsule Take 600 mg by mouth See admin instructions. 600 mg PO x1 one hour prior to all dental appointments   Yes [provider]  Febuxostat 80 MG TABS Take 80 mg by mouth daily. 09/15/19  Yes [provider]  hydrALAZINE (APRESOLINE) 25 MG tablet Take 25 mg by mouth 3 (three) times daily.   Yes [provider]  HYDROcodone-acetaminophen (NORCO/VICODIN) 5-325 MG tablet Take 1 tablet by mouth 3 (three) times daily. 09/14/19  Yes [provider]  isosorbide dinitrate (ISORDIL) 20 MG tablet Take 40 mg by mouth 2 (two) times daily. 800 and 2130  08/07/19  Yes [provider]  isosorbide dinitrate (ISORDIL) 20 MG tablet Take 20 mg by mouth daily. Midday (1330) 05/20/19  Yes [provider]  lactobacillus acidophilus (BACID) TABS tablet Take 2 tablets by mouth 2 (two) times daily with a meal.   Yes [provider]  levothyroxine (SYNTHROID, LEVOTHROID) 75 MCG tablet Take 75 mcg by mouth daily.     Yes [provider]  LORazepam (ATIVAN) 0.5 MG tablet Take 0.5 mg by mouth daily.   Yes [provider]  metoprolol succinate (TOPROL-XL) 25 MG 24 hr tablet Take 25 mg by mouth daily.   Yes [provider]  multivitamin-iron-minerals-folic acid (CENTRUM)  chewable tablet Chew 1 tablet by mouth daily.   Yes [provider]  neomycin-polymyxin b-dexamethasone (MAXITROL) 3.5-10000-0.1 OINT Place 1 application into the right eye at bedtime as needed (eye swelling, eye infection).   Yes [provider]  omeprazole (PRILOSEC) 20 MG capsule Take 20 mg by mouth daily. 08/17/19  Yes [provider]  phenylephrine-shark liver oil-mineral oil-petrolatum (PREPARATION H) 0.25-14-74.9 % rectal ointment Place 1 application rectally 4 (four) times daily as needed for hemorrhoids.   Yes [provider]  polyethylene glycol (MIRALAX / GLYCOLAX) 17 g packet Take 17 g by mouth daily as needed.   Yes [provider]  potassium chloride (KLOR-CON) 10 MEQ tablet Take 10 mEq by mouth daily.   Yes [provider]  predniSONE (DELTASONE) 50 MG tablet Take 50 mg by mouth every evening. 09/18/19 09/22/19 Yes [provider]  senna-docusate (SENNA S) 8.6-50 MG tablet Take 1 tablet by mouth 2 (two) times daily.   Yes [provider]  sulfamethoxazole-trimethoprim (BACTRIM) 400-80 MG tablet Take 1 tablet by mouth 2 (two) times daily. 09/18/19 09/25/19 Yes [provider]  torsemide (DEMADEX) 10 MG tablet Take 10 mg by mouth See admin instructions. 10 mg PO five times weekly (Tuesdays, Wednesdays, Fridays, Saturdays, and Sundays) 08/17/19  Yes [provider]  torsemide (DEMADEX) 20 MG tablet Take 20 mg by mouth 2 (two) times a week. On Mondays and Thursdays 09/15/19  Yes [provider]  traZODone (DESYREL) 50 MG tablet Take 50 mg by mouth at bedtime.   Yes [provider]  zinc gluconate 50 MG tablet Take 50 mg by mouth daily. 09/09/19 09/22/19 Yes [provider]  nitrofurantoin, macrocrystal-monohydrate, (MACROBID) 100 MG capsule Take 100 mg by mouth 2 (two) times daily. 09/15/19   [provider]    Allergies    Morphine and related, Cephalexin, and  Penicillins  Review of Systems   Review of Systems  Respiratory: Positive for shortness of breath.   All other systems reviewed and are negative.   Physical Exam Updated Vital Signs BP (!) 103/43   Pulse 85   Temp 98.6 F (37 C)   Resp (!) 24   SpO2 91%   Physical Exam Vitals and nursing note reviewed.  Constitutional:      Comments: Tachypneic, chronically ill   HENT:     Head: Normocephalic.     Mouth/Throat:     Mouth: Mucous membranes are moist.  Eyes:     Extraocular Movements: Extraocular movements intact.     Pupils: Pupils are equal, round, and reactive to light.  Cardiovascular:     Rate and Rhythm: Normal rate and regular rhythm.  Pulmonary:     Comments: Tachypneic, diminished bilaterally  Abdominal:     General: Bowel sounds are normal.  Palpations: Abdomen is soft.  Musculoskeletal:        General: Normal range of motion.     Cervical back: Normal range of motion and neck supple.  Skin:    General: Skin is warm.     Capillary Refill: Capillary refill takes less than 2 seconds.  Neurological:     General: No focal deficit present.     Mental Status: She is alert and oriented to person, place, and time.  Psychiatric:        Mood and Affect: Mood normal.        Behavior: Behavior normal.     ED Results / Procedures / Treatments   Labs (all labs ordered are listed, but only abnormal results are displayed) Labs Reviewed  CBC WITH DIFFERENTIAL/PLATELET - Abnormal; Notable for the following components:      Result Value   WBC 10.6 (*)    RBC 3.71 (*)    Hemoglobin 11.3 (*)    HCT 35.6 (*)    Neutro Abs 9.9 (*)    Lymphs Abs 0.4 (*)    Abs Immature Granulocytes 0.09 (*)    All other components within normal limits  COMPREHENSIVE METABOLIC PANEL - Abnormal; Notable for the following components:   Sodium 133 (*)    Potassium 5.9 (*)    CO2 21 (*)    Glucose, Bld 131 (*)    BUN 87 (*)    Creatinine, Ser 4.12 (*)    Calcium 8.8 (*)     Albumin 3.0 (*)    GFR calc non Af Amer 9 (*)    GFR calc Af Amer 10 (*)    All other components within normal limits  D-DIMER, QUANTITATIVE (NOT AT [facility]) - Abnormal; Notable for the following components:   D-Dimer, Quant 8.95 (*)    All other components within normal limits  C-REACTIVE PROTEIN - Abnormal; Notable for the following components:   CRP 4.7 (*)    All other components within normal limits  BLOOD GAS, VENOUS - Abnormal; Notable for the following components:   pCO2, Ven 40.0 (*)    pO2, Ven 49.2 (*)    Acid-base deficit 2.4 (*)    All other components within normal limits  URINALYSIS, ROUTINE W REFLEX MICROSCOPIC - Abnormal; Notable for the following components:   Color, Urine AMBER (*)    APPearance HAZY (*)    Leukocytes,Ua LARGE (*)    WBC, UA >50 (*)    Bacteria, UA FEW (*)    All other components within normal limits  I-STAT CHEM 8, ED - Abnormal; Notable for the following components:   Sodium 134 (*)    Potassium 5.9 (*)    BUN 86 (*)    Creatinine, Ser 4.50 (*)    Glucose, Bld 126 (*)    Hemoglobin 11.6 (*)    HCT 34.0 (*)    All other components within normal limits  CULTURE, BLOOD (ROUTINE X 2)  CULTURE, BLOOD (ROUTINE X 2)  URINE CULTURE  SARS CORONAVIRUS 2 (TAT 6-24 HRS)  LACTIC ACID, PLASMA  PROCALCITONIN  LACTATE DEHYDROGENASE  FERRITIN  TRIGLYCERIDES  FIBRINOGEN  POC SARS CORONAVIRUS 2 AG -  ED  TROPONIN I (HIGH SENSITIVITY)  TROPONIN I (HIGH SENSITIVITY)    EKG EKG Interpretation  Date/Time:  Sunday September 20 2019 20:41:24 EST Ventricular Rate:  79 PR Interval:    QRS Duration: 106 QT Interval:  391 QTC Calculation: 449 R Axis:   75 Text Interpretation: Sinus rhythm  Short PR interval Consider right atrial enlargement Anteroseptal infarct, age indeterminate Baseline wander in lead(s) V5 No significant change since last tracing Confirmed by Wandra Arthurs [id number]) on 09/20/2019 9:06:40 PM   Radiology DG Chest Port 1 View  Result Date:  09/20/2019 CLINICAL DATA:  Shortness of breath, COVID-19 positive EXAM: PORTABLE CHEST 1 VIEW COMPARISON:  Radiograph 05/09/2011 FINDINGS: Patchy right basilar and dense retrocardiac opacities. Obscuration of left hemidiaphragm may reflect basilar consolidation or effusion. No visible pneumothorax. Biapical pleuroparenchymal scarring is seen. There is some cephalized vascularity with hazy interstitial opacities suggesting pulmonary edema. Tracheal tortuosity is similar to prior. The aorta is calcified. The remaining cardiomediastinal contours are unremarkable. No acute osseous or soft tissue abnormality. Degenerative changes are present in the imaged spine and shoulders. Telemetry leads and support devices overlie the chest. IMPRESSION: 1. Bibasilar opacities, left greater than right, worrisome for pneumonia in the setting of COVID-19. 2. Pulmonary vascular congestion and hazy interstitial opacities suggesting some concomitant edema. 3. Biapical pleuroparenchymal scarring. 4.  Aortic Atherosclerosis (ICD10-I70.0). Electronically Signed   By: Lovena Le M.D.   On: 09/20/2019 21:06    Procedures Procedures (including critical care time)  CRITICAL CARE Performed by: Wandra Arthurs   Total critical care time: 30 minutes  Critical care time was exclusive of separately billable procedures and treating other patients.  Critical care was necessary to treat or prevent imminent or life-threatening deterioration.  Critical care was time spent personally by me on the following activities: development of treatment plan with patient and/or surrogate as well as nursing, discussions with consultants, evaluation of patient's response to treatment, examination of patient, obtaining history from patient or surrogate, ordering and performing treatments and interventions, ordering and review of laboratory studies, ordering and review of radiographic studies, pulse oximetry and re-evaluation of patient's  condition.   Medications Ordered in ED Medications  meropenem (MERREM) 1 g in sodium chloride 0.9 % 100 mL IVPB (1 g Intravenous New Bag/Given 09/20/19 2239)  HYDROcodone-acetaminophen (NORCO/VICODIN) 5-325 MG per tablet 1 tablet (1 tablet Oral Given 09/20/19 2109)  sodium chloride 0.9 % bolus 1,000 mL (1,000 mLs Intravenous New Bag/Given 09/20/19 2239)  sodium zirconium cyclosilicate (LOKELMA) packet 5 g (5 g Oral Given 09/20/19 2240)    ED Course  I have reviewed the triage vital signs and the nursing notes.  Pertinent labs & imaging results that were available during my care of the patient were reviewed by me and considered in my medical decision making (see chart for details).    MDM Rules/Calculators/A&P                      Merlinda Mcclean is a 86 y.o. female here with shortness of breath, weakness.  Recent diagnosis of Covid.  Has been weak and more short of breath.  Will get Covid preadmission labs and chest x-ray.  11:22 PM Patient's K is 5.9, Cr is 4.5. Talked to Dr. Jonnie Finner from nephrology. He doesn't recommend dialysis, just hydration and recheck kidney function. Also given bicarb, lokelma. Rapid COVID is negative, will send 6-24 hr test. UA + UTI. Has cephalosporin allergy so ordered meropenem. Hospitalist to admit.   Final Clinical Impression(s) / ED Diagnoses Final diagnoses:  None    Rx / DC Orders ED Discharge Orders    None       Drenda Freeze, MD 09/20/19 2323

## 2019-09-21 ENCOUNTER — Encounter (HOSPITAL_COMMUNITY): Payer: Self-pay | Admitting: Internal Medicine

## 2019-09-21 ENCOUNTER — Inpatient Hospital Stay (HOSPITAL_COMMUNITY): Payer: Medicare Other

## 2019-09-21 DIAGNOSIS — J9600 Acute respiratory failure, unspecified whether with hypoxia or hypercapnia: Secondary | ICD-10-CM | POA: Diagnosis present

## 2019-09-21 DIAGNOSIS — M79661 Pain in right lower leg: Secondary | ICD-10-CM

## 2019-09-21 DIAGNOSIS — M79662 Pain in left lower leg: Secondary | ICD-10-CM

## 2019-09-21 DIAGNOSIS — Z8616 Personal history of COVID-19: Secondary | ICD-10-CM | POA: Diagnosis present

## 2019-09-21 LAB — BASIC METABOLIC PANEL
Anion gap: 8 (ref 5–15)
BUN: 78 mg/dL — ABNORMAL HIGH (ref 8–23)
CO2: 21 mmol/L — ABNORMAL LOW (ref 22–32)
Calcium: 8.3 mg/dL — ABNORMAL LOW (ref 8.9–10.3)
Chloride: 107 mmol/L (ref 98–111)
Creatinine, Ser: 3.44 mg/dL — ABNORMAL HIGH (ref 0.44–1.00)
GFR calc Af Amer: 13 mL/min — ABNORMAL LOW (ref >60)
GFR calc non Af Amer: 11 mL/min — ABNORMAL LOW (ref >60)
Glucose, Bld: 117 mg/dL — ABNORMAL HIGH (ref 70–99)
Potassium: 5.1 mmol/L (ref 3.5–5.1)
Sodium: 136 mmol/L (ref 135–145)

## 2019-09-21 LAB — URINE CULTURE: Culture: <10000 — AB

## 2019-09-21 LAB — GLUCOSE, CAPILLARY: Glucose-Capillary: 106 mg/dL — ABNORMAL HIGH (ref 70–99)

## 2019-09-21 LAB — CORTISOL: Cortisol, Plasma: >100 ug/dL

## 2019-09-21 LAB — CBC WITH DIFFERENTIAL/PLATELET
Abs Immature Granulocytes: 0.12 10*3/uL — ABNORMAL HIGH (ref 0.00–0.07)
Basophils Absolute: 0 10*3/uL (ref 0.0–0.1)
Basophils Relative: 0 %
Eosinophils Absolute: 0 10*3/uL (ref 0.0–0.5)
Eosinophils Relative: 0 %
HCT: 35.8 % — ABNORMAL LOW (ref 36.0–46.0)
Hemoglobin: 11 g/dL — ABNORMAL LOW (ref 12.0–15.0)
Immature Granulocytes: 2 %
Lymphocytes Relative: 7 %
Lymphs Abs: 0.6 10*3/uL — ABNORMAL LOW (ref 0.7–4.0)
MCH: 29.9 pg (ref 26.0–34.0)
MCHC: 30.7 g/dL (ref 30.0–36.0)
MCV: 97.3 fL (ref 80.0–100.0)
Monocytes Absolute: 0.2 10*3/uL (ref 0.1–1.0)
Monocytes Relative: 2 %
Neutro Abs: 6.9 10*3/uL (ref 1.7–7.7)
Neutrophils Relative %: 89 %
Platelets: 301 10*3/uL (ref 150–400)
RBC: 3.68 MIL/uL — ABNORMAL LOW (ref 3.87–5.11)
RDW: 14.6 % (ref 11.5–15.5)
WBC: 7.8 10*3/uL (ref 4.0–10.5)
nRBC: 0 % (ref 0.0–0.2)

## 2019-09-21 LAB — MRSA PCR SCREENING
MRSA by PCR: NEGATIVE
MRSA by PCR: NEGATIVE

## 2019-09-21 LAB — CREATININE, URINE, RANDOM: Creatinine, Urine: 64.63 mg/dL

## 2019-09-21 LAB — SODIUM, URINE, RANDOM: Sodium, Ur: 39 mmol/L

## 2019-09-21 LAB — SARS CORONAVIRUS 2 (TAT 6-24 HRS): SARS Coronavirus 2: POSITIVE — AB

## 2019-09-21 LAB — POTASSIUM: Potassium: 4.6 mmol/L (ref 3.5–5.1)

## 2019-09-21 MED ORDER — ACETAMINOPHEN 650 MG RE SUPP: 650.0000 mg | Freq: Four times a day (QID) | RECTAL | Status: DC | PRN

## 2019-09-21 MED ORDER — SODIUM CHLORIDE 0.9 % IV SOLN
1.0000 g | Freq: Every day | INTRAVENOUS | Status: AC
  Administered 2019-09-21 – 2019-09-22 (×2): 1 g via INTRAVENOUS
  Filled 2019-09-21 (×2): qty 1

## 2019-09-21 MED ORDER — SODIUM CHLORIDE 0.9 % IV SOLN: INTRAVENOUS | Status: AC

## 2019-09-21 MED ORDER — LEVOTHYROXINE SODIUM 75 MCG PO TABS
75.0000 ug | ORAL_TABLET | Freq: Every day | ORAL | Status: DC
  Administered 2019-09-21 – 2019-09-28 (×8): 75 ug via ORAL
  Filled 2019-09-21 (×8): qty 1

## 2019-09-21 MED ORDER — ONDANSETRON HCL 4 MG PO TABS: 4.0000 mg | ORAL_TABLET | Freq: Four times a day (QID) | ORAL | Status: DC | PRN

## 2019-09-21 MED ORDER — PREDNISONE 20 MG PO TABS
50.0000 mg | ORAL_TABLET | Freq: Every evening | ORAL | Status: DC
  Administered 2019-09-21: 50 mg via ORAL
  Filled 2019-09-21: qty 2

## 2019-09-21 MED ORDER — FEBUXOSTAT 40 MG PO TABS
80.0000 mg | ORAL_TABLET | Freq: Every day | ORAL | Status: DC
  Administered 2019-09-21 – 2019-09-28 (×7): 80 mg via ORAL
  Filled 2019-09-21 (×9): qty 2

## 2019-09-21 MED ORDER — SODIUM CHLORIDE 0.9% FLUSH
3.0000 mL | Freq: Two times a day (BID) | INTRAVENOUS | Status: DC
  Administered 2019-09-21 – 2019-09-28 (×13): 3 mL via INTRAVENOUS

## 2019-09-21 MED ORDER — ACETAMINOPHEN 325 MG PO TABS
650.0000 mg | ORAL_TABLET | Freq: Four times a day (QID) | ORAL | Status: DC | PRN
  Administered 2019-09-22 – 2019-09-27 (×7): 650 mg via ORAL
  Filled 2019-09-21 (×7): qty 2

## 2019-09-21 MED ORDER — ONDANSETRON HCL 4 MG/2ML IJ SOLN
4.0000 mg | Freq: Four times a day (QID) | INTRAMUSCULAR | Status: DC | PRN
  Administered 2019-09-27: 02:00:00 4 mg via INTRAVENOUS
  Filled 2019-09-21: qty 2

## 2019-09-21 MED ORDER — HYDROCODONE-ACETAMINOPHEN 5-325 MG PO TABS
1.0000 | ORAL_TABLET | Freq: Three times a day (TID) | ORAL | Status: DC | PRN
  Administered 2019-09-21 – 2019-09-28 (×18): 1 via ORAL
  Filled 2019-09-21 (×18): qty 1

## 2019-09-21 MED ORDER — SODIUM CHLORIDE 0.9 % IV SOLN
INTRAVENOUS | Status: DC | PRN
  Administered 2019-09-21: 1000 mL via INTRAVENOUS

## 2019-09-21 MED ORDER — ASPIRIN EC 81 MG PO TBEC
81.0000 mg | DELAYED_RELEASE_TABLET | Freq: Every evening | ORAL | Status: DC
  Administered 2019-09-21 – 2019-09-27 (×7): 81 mg via ORAL
  Filled 2019-09-21 (×6): qty 1

## 2019-09-21 MED ORDER — LORAZEPAM 1 MG PO TABS
0.5000 mg | ORAL_TABLET | Freq: Every day | ORAL | Status: DC
  Administered 2019-09-21 – 2019-09-26 (×6): 0.5 mg via ORAL
  Filled 2019-09-21 (×6): qty 1

## 2019-09-21 MED ORDER — ORAL CARE MOUTH RINSE
15.0000 mL | Freq: Two times a day (BID) | OROMUCOSAL | Status: DC
  Administered 2019-09-21 – 2019-09-28 (×13): 15 mL via OROMUCOSAL

## 2019-09-21 MED ORDER — RISAQUAD PO CAPS
2.0000 | ORAL_CAPSULE | Freq: Two times a day (BID) | ORAL | Status: DC
  Administered 2019-09-21 – 2019-09-28 (×15): 2 via ORAL
  Filled 2019-09-21 (×19): qty 2

## 2019-09-21 MED ORDER — SODIUM CHLORIDE 0.9 % IV SOLN
200.0000 mg | Freq: Once | INTRAVENOUS | Status: AC
  Administered 2019-09-21: 200 mg via INTRAVENOUS
  Filled 2019-09-21: qty 200

## 2019-09-21 MED ORDER — SODIUM CHLORIDE 0.9 % IV SOLN
100.0000 mg | Freq: Every day | INTRAVENOUS | Status: AC
  Administered 2019-09-22 – 2019-09-25 (×4): 100 mg via INTRAVENOUS
  Filled 2019-09-21 (×2): qty 20
  Filled 2019-09-21: qty 100
  Filled 2019-09-21: qty 20

## 2019-09-21 MED ORDER — CHLORHEXIDINE GLUCONATE CLOTH 2 % EX PADS
6.0000 | MEDICATED_PAD | Freq: Every day | CUTANEOUS | Status: DC
  Administered 2019-09-21 – 2019-09-28 (×8): 6 via TOPICAL

## 2019-09-21 MED ORDER — TRAZODONE HCL 50 MG PO TABS
50.0000 mg | ORAL_TABLET | Freq: Every day | ORAL | Status: DC
  Administered 2019-09-21 – 2019-09-27 (×8): 50 mg via ORAL
  Filled 2019-09-21 (×10): qty 1

## 2019-09-21 MED ORDER — PANTOPRAZOLE SODIUM 40 MG PO TBEC
40.0000 mg | DELAYED_RELEASE_TABLET | Freq: Every day | ORAL | Status: DC
  Administered 2019-09-21 – 2019-09-28 (×8): 40 mg via ORAL
  Filled 2019-09-21 (×8): qty 1

## 2019-09-21 NOTE — ED Notes (Signed)
Unknown per Lanetta Inch, RN why lactic was not drawn at 2230 last night. Chrys Racer, RN will collect now.

## 2019-09-21 NOTE — Consult Note (Signed)
Reason for Consult: Renal failure Referring Physician:  Dr. Tana Coast  Chief Complaint:  Shortness of breath  Assessment/Plan: 1. Acute kidney injury on CKD 4 (creatinine already 1.9 05/2011) - will need records from outside PCP to determine progression. I tried calling both numbers listed in the chart without success. Acute component likely secondary to prerenal state (not consuming much + diarrhea + Bactrim) - Renal ultrasound to better assess cortical thickness and size of kidney; I don't think she will have obstruction. - Continue hydration with isotonic fluids if she tolerates; will help with distal Na/K exchange. - She's very fortunate her renal function has already started to improve as she is not a good candidate for dialysis (especially long term) but I'm not certain she will even tolerate short term iHD. 2. COVD19 PNA - per primary team currently on 3L O2.  3. Recurrent UTI's 4. H/o HTN but currently on low side - continue holding meds for now to incr critical organ perfusion. 5. dCHF   HPI: Krystal Mckenzie is an 86 y.o. female dCHF HTN hypothyroidism CKD IIIb/IV who had a creatinine that was already 1.9 05/2011.  She was diagnosed with COVID19 less than a week ago and placed on supplemental O2 + steroids at [facility]. Also being treated for recurrnet UTI with nitrofurantoin -> Bactrim for past 3 days. Her main complaint is suprapubic pain and dysuria. She does not recall ever being told she has kidney issues. Pt was noted to be in AKI with a cr of 4.5 which decr to 3.4 with hydration and potassium that decr from 5.9 -> 5.1 with Lokelma and therapy. Has also had diarrhea; has generalized aches also.  ROS Pertinent items are noted in HPI.  Chemistry and CBC: Creatinine, Ser  Date/Time Value Ref Range Status  09/21/2019 01:23 PM 3.44 (H) 0.44 - 1.00 mg/dL Final  09/20/2019 09:44 PM 4.50 (H) 0.44 - 1.00 mg/dL Final  09/20/2019 09:21 PM 4.12 (H) 0.44 - 1.00 mg/dL Final  05/09/2011 07:00 PM 1.90 (H)  0.50 - 1.10 mg/dL Final   Recent Labs  Lab 09/20/19 2121 09/20/19 2144 09/21/19 1323  NA 133* 134* 136  K 5.9* 5.9* 5.1  CL 103 105 107  CO2 21*  --  21*  GLUCOSE 131* 126* 117*  BUN 87* 86* 78*  CREATININE 4.12* 4.50* 3.44*  CALCIUM 8.8*  --  8.3*   Recent Labs  Lab 09/20/19 2121 09/20/19 2144 09/21/19 0543  WBC 10.6*  --  7.8  NEUTROABS 9.9*  --  6.9  HGB 11.3* 11.6* 11.0*  HCT 35.6* 34.0* 35.8*  MCV 96.0  --  97.3  PLT 304  --  301   Liver Function Tests: Recent Labs  Lab 09/20/19 2121  AST 20  ALT 14  ALKPHOS 72  BILITOT 0.4  PROT 6.6  ALBUMIN 3.0*   No results for input(s): LIPASE, AMYLASE in the last 168 hours. No results for input(s): AMMONIA in the last 168 hours. Cardiac Enzymes: No results for input(s): CKTOTAL, CKMB, CKMBINDEX, TROPONINI in the last 168 hours. Iron Studies:  Recent Labs    09/20/19 2121  FERRITIN 189   PT/INR: '@LABRCNTIP'$ (inr:5)  Xrays/Other Studies: ) Results for orders placed or performed during the hospital encounter of 09/20/19 (from the past 48 hour(s))  Urinalysis, Routine w reflex microscopic     Status: Abnormal   Collection Time: 09/20/19  9:00 PM  Result Value Ref Range   Color, Urine AMBER (A) YELLOW    Comment: BIOCHEMICALS MAY BE  AFFECTED BY COLOR   APPearance HAZY (A) CLEAR   Specific Gravity, Urine 1.013 1.005 - 1.030   pH 5.0 5.0 - 8.0   Glucose, UA NEGATIVE NEGATIVE mg/dL   Hgb urine dipstick NEGATIVE NEGATIVE   Bilirubin Urine NEGATIVE NEGATIVE   Ketones, ur NEGATIVE NEGATIVE mg/dL   Protein, ur NEGATIVE NEGATIVE mg/dL   Nitrite NEGATIVE NEGATIVE   Leukocytes,Ua LARGE (A) NEGATIVE   RBC / HPF 6-10 0 - 5 RBC/hpf   WBC, UA >50 (H) 0 - 5 WBC/hpf   Bacteria, UA FEW (A) NONE SEEN   Squamous Epithelial / LPF 0-5 0 - 5    Comment: Performed at [facility], [place] [address], [place], [place] [address]  Creatinine, urine, random     Status: None   Collection Time: 09/20/19  9:00 PM   Result Value Ref Range   Creatinine, Urine 64.63 mg/dL    Comment: Performed at [facility], [place] [address] [place], [place] [address]  Sodium, urine, random     Status: None   Collection Time: 09/20/19  9:00 PM  Result Value Ref Range   Sodium, Ur 39 mmol/L    Comment: Performed at [facility], [place] [address] [place], [place] [address]  Lactic acid, plasma     Status: None   Collection Time: 09/20/19  9:21 PM  Result Value Ref Range   Lactic Acid, Venous 0.9 0.5 - 1.9 mmol/L    Comment: Performed at [facility], [place] [address], [place], [place] [address]  CBC WITH DIFFERENTIAL     Status: Abnormal   Collection Time: 09/20/19  9:21 PM  Result Value Ref Range   WBC 10.6 (H) 4.0 - 10.5 K/uL   RBC 3.71 (L) 3.87 - 5.11 MIL/uL   Hemoglobin 11.3 (L) 12.0 - 15.0 g/dL   HCT 35.6 (L) 36.0 - 46.0 %   MCV 96.0 80.0 - 100.0 fL   MCH 30.5 26.0 - 34.0 pg   MCHC 31.7 30.0 - 36.0 g/dL   RDW 14.5 11.5 - 15.5 %   Platelets 304 150 - 400 K/uL   nRBC 0.0 0.0 - 0.2 %   Neutrophils Relative % 93 %   Neutro Abs 9.9 (H) 1.7 - 7.7 K/uL   Lymphocytes Relative 4 %   Lymphs Abs 0.4 (L) 0.7 - 4.0 K/uL   Monocytes Relative 2 %   Monocytes Absolute 0.2 0.1 - 1.0 K/uL   Eosinophils Relative 0 %   Eosinophils Absolute 0.0 0.0 - 0.5 K/uL   Basophils Relative 0 %   Basophils Absolute 0.0 0.0 - 0.1 K/uL   Immature Granulocytes 1 %   Abs Immature Granulocytes 0.09 (H) 0.00 - 0.07 K/uL   Burr Cells PRESENT    Polychromasia PRESENT     Comment: Performed at [facility], [place] [address], [place],  [address]  Comprehensive metabolic panel     Status: Abnormal   Collection Time: 09/20/19  9:21 PM  Result Value Ref Range   Sodium 133 (L) 135 - 145 mmol/L   Potassium 5.9 (H) 3.5 - 5.1 mmol/L   Chloride 103 98 - 111 mmol/L   CO2 21 (L) 22 - 32 mmol/L   Glucose, Bld 131 (H) 70 - 99 mg/dL   BUN 87 (H) 8 - 23 mg/dL    Creatinine, Ser 4.12 (H) 0.44 - 1.00 mg/dL   Calcium 8.8 (L) 8.9 - 10.3 mg/dL   Total Protein 6.6  6.5 - 8.1 g/dL   Albumin 3.0 (L) 3.5 - 5.0 g/dL   AST 20 15 - 41 U/L   ALT 14 0 - 44 U/L   Alkaline Phosphatase 72 38 - 126 U/L   Total Bilirubin 0.4 0.3 - 1.2 mg/dL   GFR calc non Af Amer 9 (L) >60 mL/min   GFR calc Af Amer 10 (L) >60 mL/min   Anion gap 9 5 - 15    Comment: Performed at [facility], [place] [address] [place], [place] [address]  D-dimer, quantitative     Status: Abnormal   Collection Time: 09/20/19  9:21 PM  Result Value Ref Range   D-Dimer, Quant 8.95 (H) 0.00 - 0.50 ug/mL-FEU    Comment: (NOTE) At the manufacturer cut-off of 0.50 ug/mL FEU, this assay has been documented to exclude PE with a sensitivity and negative predictive value of 97 to 99%.  At this time, this assay has not been approved by the FDA to exclude DVT/VTE. Results should be correlated with clinical presentation. Performed at [facility], [place] [address] [place], [place] [address]   Procalcitonin     Status: None   Collection Time: 09/20/19  9:21 PM  Result Value Ref Range   Procalcitonin 1.33 ng/mL    Comment:        Interpretation: PCT > 0.5 ng/mL and <= 2 ng/mL: Systemic infection (sepsis) is possible, but other conditions are known to elevate PCT as well. (NOTE)       Sepsis PCT Algorithm           Lower Respiratory Tract                                      Infection PCT Algorithm    ----------------------------     ----------------------------         PCT < 0.25 ng/mL                PCT < 0.10 ng/mL         Strongly encourage             Strongly discourage   discontinuation of antibiotics    initiation of antibiotics    ----------------------------     -----------------------------       PCT 0.25 - 0.50 ng/mL            PCT 0.10 - 0.25 ng/mL               OR       >80% decrease in PCT            Discourage initiation of                                             antibiotics      Encourage discontinuation           of antibiotics    ----------------------------     -----------------------------         PCT >= 0.50 ng/mL              PCT 0.26 - 0.50 ng/mL                AND       <80% decrease in PCT  Encourage initiation of                                             antibiotics       Encourage continuation           of antibiotics    ----------------------------     -----------------------------        PCT >= 0.50 ng/mL                  PCT > 0.50 ng/mL               AND         increase in PCT                  Strongly encourage                                      initiation of antibiotics    Strongly encourage escalation           of antibiotics                                     -----------------------------                                           PCT <= 0.25 ng/mL                                                 OR                                        > 80% decrease in PCT                                     Discontinue / Do not initiate                                             antibiotics Performed at [place] [address] [place], [place] [address]   Lactate dehydrogenase     Status: None   Collection Time: 09/20/19  9:21 PM  Result Value Ref Range   LDH 168 98 - 192 U/L    Comment: Performed at [facility], [place] [address], [place], [place] [address]  Ferritin     Status: None   Collection Time: 09/20/19  9:21 PM  Result Value Ref Range   Ferritin 189 11 - 307 ng/mL    Comment: Performed at [facility], [place] [address] [place], [place] [address]  Triglycerides     Status: None   Collection Time: 09/20/19  9:21 PM  Result Value  Ref Range   Triglycerides 126 <150 mg/dL    Comment: Performed at [facility], [place] [address] [place], [place] [address]  Fibrinogen     Status: None   Collection Time: 09/20/19   9:21 PM  Result Value Ref Range   Fibrinogen 424 210 - 475 mg/dL    Comment: Performed at [facility], [place] [address] [place], [place] [address]  C-reactive protein     Status: Abnormal   Collection Time: 09/20/19  9:21 PM  Result Value Ref Range   CRP 4.7 (H) <1.0 mg/dL    Comment: Performed at [facility], [place] [address], [place], [place] [address]  Blood gas, venous     Status: Abnormal   Collection Time: 09/20/19  9:21 PM  Result Value Ref Range   FIO2 NOT PROVIDED    pH, Ven 7.364 7.250 - 7.430   pCO2, Ven 40.0 (L) 44.0 - 60.0 mmHg   pO2, Ven 49.2 (H) 32.0 - 45.0 mmHg   Bicarbonate 22.2 20.0 - 28.0 mmol/L   Acid-base deficit 2.4 (H) 0.0 - 2.0 mmol/L   O2 Saturation 85.6 %   Patient temperature 98.6     Comment: Performed at [facility], [place] [address] [place], [place] [address]  Troponin I (High Sensitivity)     Status: None   Collection Time: 09/20/19  9:21 PM  Result Value Ref Range   Troponin I (High Sensitivity) 14 <18 ng/L    Comment: (NOTE) Elevated high sensitivity troponin I (hsTnI) values and significant  changes across serial measurements may suggest ACS but many other  chronic and acute conditions are known to elevate hsTnI results.  Refer to the "Links" section for chest pain algorithms and additional  guidance. Performed at [facility], [place] [address] [place], [place] [address]   I-stat chem 8, ED (not at [facility] or [facility])     Status: Abnormal   Collection Time: 09/20/19  9:44 PM  Result Value Ref Range   Sodium 134 (L) 135 - 145 mmol/L   Potassium 5.9 (H) 3.5 - 5.1 mmol/L   Chloride 105 98 - 111 mmol/L   BUN 86 (H) 8 - 23 mg/dL   Creatinine, Ser 4.50 (H) 0.44 - 1.00 mg/dL   Glucose, Bld 126 (H) 70 - 99 mg/dL   Calcium, Ion 1.22 1.15 - 1.40 mmol/L   TCO2 24 22 - 32 mmol/L   Hemoglobin 11.6 (L) 12.0 - 15.0 g/dL   HCT 34.0 (L) 36.0 - 46.0 %  POC SARS Coronavirus 2 Ag-ED - Nasal  Swab (BD Veritor Kit)     Status: None   Collection Time: 09/20/19 10:00 PM  Result Value Ref Range   SARS Coronavirus 2 Ag NEGATIVE NEGATIVE    Comment: (NOTE) SARS-CoV-2 antigen NOT DETECTED.  Negative results are presumptive.  Negative results do not preclude SARS-CoV-2 infection and should not be used as the sole basis for treatment or other patient management decisions, including infection  control decisions, particularly in the presence of clinical signs and  symptoms consistent with COVID-19, or in those who have been in contact with the virus.  Negative results must be combined with clinical observations, patient history, and epidemiological information. The expected result is Negative. Fact Sheet for Patients: PodPark.tn Fact Sheet for Healthcare Providers: GiftContent.is This test is not yet approved or cleared by the [place] FDA and  has been authorized for detection and/or diagnosis of SARS-CoV-2 by FDA  under an Emergency Use Authorization (EUA).  This EUA will remain in effect (meaning this test can be used) for the duration of  the COVID-19 de claration under Section 564(b)(1) of the Act, 21 U.S.C. section 360bbb-3(b)(1), unless the authorization is terminated or revoked sooner.   SARS CORONAVIRUS 2 (TAT 6-24 HRS) Nasopharyngeal Nasopharyngeal Swab     Status: Abnormal   Collection Time: 09/20/19 10:44 PM   Specimen: Nasopharyngeal Swab  Result Value Ref Range   SARS Coronavirus 2 POSITIVE (A) NEGATIVE    Comment: RESULT CALLED TO, READ BACK BY AND VERIFIED WITH: H. COOK,RN 0630 09/21/2019 T. TYSOR (NOTE) SARS-CoV-2 target nucleic acids are DETECTED. The SARS-CoV-2 RNA is generally detectable in upper and lower respiratory specimens during the acute phase of infection. Positive results are indicative of the presence of SARS-CoV-2 RNA. Clinical correlation with patient history and other diagnostic  information is  necessary to determine patient infection status. Positive results do not rule out bacterial infection or co-infection with other viruses.  The expected result is Negative. Fact Sheet for Patients: SugarRoll.be Fact Sheet for Healthcare Providers: https://www.woods-mathews.com/ This test is not yet approved or cleared by the [place] FDA and  has been authorized for detection and/or diagnosis of SARS-CoV-2 by FDA under an Emergency Use Authorization (EUA). This EUA will remain  in effect (meaning this test can be used) for the  duration of the COVID-19 declaration under Section 564(b)(1) of the Act, 21 U.S.C. section 360bbb-3(b)(1), unless the authorization is terminated or revoked sooner. Performed at [facility], [place] [address] [place], [place] [address]   Cortisol     Status: None   Collection Time: 09/21/19  5:43 AM  Result Value Ref Range   Cortisol, Plasma >100.0 ug/dL    Comment: RESULTS CONFIRMED BY MANUAL DILUTION (NOTE) AM    6.7 - 22.6 ug/dL PM   <10.0       ug/dL Performed at [place] [address] [place], [place] [address]   MRSA PCR Screening     Status: None   Collection Time: 09/21/19  5:43 AM   Specimen: Nasal Mucosa; Nasopharyngeal  Result Value Ref Range   MRSA by PCR NEGATIVE NEGATIVE    Comment:        The GeneXpert MRSA Assay (FDA approved for NASAL specimens only), is one component of a comprehensive MRSA colonization surveillance program. It is not intended to diagnose MRSA infection nor to guide or monitor treatment for MRSA infections. Performed at [facility], [place] [address], [place], [place] [address]   CBC WITH DIFFERENTIAL     Status: Abnormal   Collection Time: 09/21/19  5:43 AM  Result Value Ref Range   WBC 7.8 4.0 - 10.5 K/uL   RBC 3.68 (L) 3.87 - 5.11 MIL/uL   Hemoglobin 11.0 (L) 12.0 - 15.0 g/dL   HCT 35.8 (L) 36.0 - 46.0 %   MCV  97.3 80.0 - 100.0 fL   MCH 29.9 26.0 - 34.0 pg   MCHC 30.7 30.0 - 36.0 g/dL   RDW 14.6 11.5 - 15.5 %   Platelets 301 150 - 400 K/uL   nRBC 0.0 0.0 - 0.2 %   Neutrophils Relative % 89 %   Neutro Abs 6.9 1.7 - 7.7 K/uL   Lymphocytes Relative 7 %   Lymphs Abs 0.6 (L) 0.7 - 4.0 K/uL   Monocytes Relative 2 %   Monocytes Absolute 0.2 0.1 - 1.0 K/uL   Eosinophils  Relative 0 %   Eosinophils Absolute 0.0 0.0 - 0.5 K/uL   Basophils Relative 0 %   Basophils Absolute 0.0 0.0 - 0.1 K/uL   Immature Granulocytes 2 %   Abs Immature Granulocytes 0.12 (H) 0.00 - 0.07 K/uL    Comment: Performed at [facility], [place] [address] [place], [place] [address]  Basic metabolic panel     Status: Abnormal   Collection Time: 09/21/19  1:23 PM  Result Value Ref Range   Sodium 136 135 - 145 mmol/L   Potassium 5.1 3.5 - 5.1 mmol/L   Chloride 107 98 - 111 mmol/L   CO2 21 (L) 22 - 32 mmol/L   Glucose, Bld 117 (H) 70 - 99 mg/dL   BUN 78 (H) 8 - 23 mg/dL   Creatinine, Ser 3.44 (H) 0.44 - 1.00 mg/dL   Calcium 8.3 (L) 8.9 - 10.3 mg/dL   GFR calc non Af Amer 11 (L) >60 mL/min   GFR calc Af Amer 13 (L) >60 mL/min   Anion gap 8 5 - 15    Comment: Performed at [facility], [place] [address] [place], [place] [address]   DG Chest Port 1 View  Result Date: 09/20/2019 CLINICAL DATA:  Shortness of breath, COVID-19 positive EXAM: PORTABLE CHEST 1 VIEW COMPARISON:  Radiograph 05/09/2011 FINDINGS: Patchy right basilar and dense retrocardiac opacities. Obscuration of left hemidiaphragm may reflect basilar consolidation or effusion. No visible pneumothorax. Biapical pleuroparenchymal scarring is seen. There is some cephalized vascularity with hazy interstitial opacities suggesting pulmonary edema. Tracheal tortuosity is similar to prior. The aorta is calcified. The remaining cardiomediastinal contours are unremarkable. No acute osseous or soft tissue abnormality. Degenerative changes are  present in the imaged spine and shoulders. Telemetry leads and support devices overlie the chest. IMPRESSION: 1. Bibasilar opacities, left greater than right, worrisome for pneumonia in the setting of COVID-19. 2. Pulmonary vascular congestion and hazy interstitial opacities suggesting some concomitant edema. 3. Biapical pleuroparenchymal scarring. 4.  Aortic Atherosclerosis (ICD10-I70.0). Electronically Signed   By: Lovena Le M.D.   On: 09/20/2019 21:06    PMH:   Past Medical History:  Diagnosis Date  . Hypertension   . Hypothyroid     PSH:   Past Surgical History:  Procedure Laterality Date  . JOINT REPLACEMENT     Left knee  . REPLACEMENT TOTAL KNEE      Allergies:  Allergies  Allergen Reactions  . Morphine And Related   . Cephalexin Hives, Swelling and Rash  . Penicillins Swelling and Rash    Did it involve swelling of the face/tongue/throat, SOB, or low BP? Unknown Did it involve sudden or severe rash/hives, skin peeling, or any reaction on the inside of your mouth or nose? Unknown Did you need to seek medical attention at a hospital or doctor's office? Unknown When did it last happen? Unknown If all above answers are "NO", may proceed with cephalosporin use.     Medications:   Prior to Admission medications   Medication Sig Start Date End Date Taking? Authorizing Provider  acetaminophen (TYLENOL) 325 MG tablet Take 650 mg by mouth every 4 (four) hours as needed for fever.   Yes [provider]  amLODipine (NORVASC) 5 MG tablet Take 5 mg by mouth daily. 09/15/19  Yes [provider]  ascorbic acid (VITAMIN C) 500 MG tablet Take 500 mg by mouth daily. 09/09/19 09/22/19 Yes [provider]  aspirin EC 81 MG tablet Take 81 mg by  mouth every evening. 09/09/19 09/22/19 Yes [provider]  AZO-CRANBERRY PO Take 1 tablet by mouth 2 (two) times daily. AZO cranberry + Probiotic '250mg'$ -'30mg'$ -50 million cell   Yes [provider]  b complex  vitamins tablet Take 1 tablet by mouth daily. 09/09/19 09/22/19 Yes [provider]  calcium-vitamin D (OS-CAL 500 + D) 500-200 MG-UNIT per tablet Take 1 tablet by mouth daily.     Yes [provider]  Cholecalciferol (VITAMIN D3) 50 MCG (2000 UT) TABS Take 50 mcg by mouth daily.   Yes [provider]  clindamycin (CLEOCIN) 150 MG capsule Take 600 mg by mouth See admin instructions. 600 mg PO x1 one hour prior to all dental appointments   Yes [provider]  Febuxostat 80 MG TABS Take 80 mg by mouth daily. 09/15/19  Yes [provider]  hydrALAZINE (APRESOLINE) 25 MG tablet Take 25 mg by mouth 3 (three) times daily.   Yes [provider]  HYDROcodone-acetaminophen (NORCO/VICODIN) 5-325 MG tablet Take 1 tablet by mouth 3 (three) times daily. 09/14/19  Yes [provider]  isosorbide dinitrate (ISORDIL) 20 MG tablet Take 40 mg by mouth 2 (two) times daily. 800 and 2130 08/07/19  Yes [provider]  isosorbide dinitrate (ISORDIL) 20 MG tablet Take 20 mg by mouth daily. Midday (1330) 05/20/19  Yes [provider]  lactobacillus acidophilus (BACID) TABS tablet Take 2 tablets by mouth 2 (two) times daily with a meal.   Yes [provider]  levothyroxine (SYNTHROID, LEVOTHROID) 75 MCG tablet Take 75 mcg by mouth daily.     Yes [provider]  LORazepam (ATIVAN) 0.5 MG tablet Take 0.5 mg by mouth daily.   Yes [provider]  metoprolol succinate (TOPROL-XL) 25 MG 24 hr tablet Take 25 mg by mouth daily.   Yes [provider]  multivitamin-iron-minerals-folic acid (CENTRUM) chewable tablet Chew 1 tablet by mouth daily.   Yes [provider]  neomycin-polymyxin b-dexamethasone (MAXITROL) 3.5-10000-0.1 OINT Place 1 application into the right eye at bedtime as needed (eye swelling, eye infection).   Yes [provider]  omeprazole (PRILOSEC) 20 MG capsule Take 20 mg by mouth daily.  08/17/19  Yes [provider]  phenylephrine-shark liver oil-mineral oil-petrolatum (PREPARATION H) 0.25-14-74.9 % rectal ointment Place 1 application rectally 4 (four) times daily as needed for hemorrhoids.   Yes [provider]  polyethylene glycol (MIRALAX / GLYCOLAX) 17 g packet Take 17 g by mouth daily as needed.   Yes [provider]  potassium chloride (KLOR-CON) 10 MEQ tablet Take 10 mEq by mouth daily.   Yes [provider]  predniSONE (DELTASONE) 50 MG tablet Take 50 mg by mouth every evening. 09/18/19 09/22/19 Yes [provider]  senna-docusate (SENNA S) 8.6-50 MG tablet Take 1 tablet by mouth 2 (two) times daily.   Yes [provider]  sulfamethoxazole-trimethoprim (BACTRIM) 400-80 MG tablet Take 1 tablet by mouth 2 (two) times daily. 09/18/19 09/25/19 Yes [provider]  torsemide (DEMADEX) 10 MG tablet Take 10 mg by mouth See admin instructions. 10 mg PO five times weekly (Tuesdays, Wednesdays, Fridays, Saturdays, and Sundays) 08/17/19  Yes [provider]  torsemide (DEMADEX) 20 MG tablet Take 20 mg by mouth 2 (two) times a week. On Mondays and Thursdays 09/15/19  Yes [provider]  traZODone (DESYREL) 50 MG tablet Take 50 mg by mouth at bedtime.   Yes [provider]  zinc gluconate 50 MG tablet Take 50  mg by mouth daily. 09/09/19 09/22/19 Yes [provider]  nitrofurantoin, macrocrystal-monohydrate, (MACROBID) 100 MG capsule Take 100 mg by mouth 2 (two) times daily. 09/15/19   [provider]    Discontinued Meds:   Medications Discontinued During This Encounter  Medication Reason  . Multiple Vitamins-Minerals (MULTIVITAMINS THER. W/MINERALS) TABS   . HYDROcodone-ibuprofen (VICOPROFEN) 7.5-200 MG per tablet   . metoprolol tartrate (LOPRESSOR) 25 MG tablet Change in therapy  . sulfamethoxazole-trimethoprim (BACTRIM DS) 800-160 MG per tablet   . gabapentin (NEURONTIN) 300 MG  capsule   . levothyroxine (SYNTHROID, LEVOTHROID) 125 MCG tablet Discontinued by provider  . olmesartan-hydrochlorothiazide (BENICAR HCT) 20-12.5 MG per tablet Discontinued by provider  . pyridOXINE (VITAMIN B-6) 50 MG tablet Discontinued by provider  . triamterene-hydrochlorothiazide (DYAZIDE) 37.5-25 MG per capsule Discontinued by provider  . sodium chloride 0.9 % bolus 500 mL   . meropenem (MERREM) 1 g in sodium chloride 0.9 % 100 mL IVPB     Social History:  reports that she has never smoked. She has never used smokeless tobacco. She reports that she does not drink alcohol or use drugs.  Family History:  History reviewed. No pertinent family history.  Blood pressure (!) 110/51, pulse 63, temperature 98.6 F (37 C), resp. rate 18, height 5' 1" (1.549 m), weight 68 kg, SpO2 96 %. General appearance: alert and cooperative Head: Normocephalic, without obvious abnormality, atraumatic Eyes: negative Neck: no adenopathy, no carotid bruit, no JVD, supple, symmetrical, trachea midline and thyroid not enlarged, symmetric, no tenderness/mass/nodules Back: negative, symmetric, no curvature. ROM normal. No CVA tenderness. Resp: Poor air movement Chest wall: no tenderness Cardio: regular rate and rhythm GI: soft, non-tender; bowel sounds normal; no masses,  no organomegaly Extremities: edema 1+ and tender b/l Pulses: 2+ and symmetric       Shiryl Ruddy, Hunt Oris, MD 09/21/2019, 2:00 PM

## 2019-09-21 NOTE — Progress Notes (Signed)
Bilateral lower extremity venous duplex has been completed. Preliminary results can be found in CV Proc through chart review.   09/21/19 4:31 PM Krystal Mckenzie RVT

## 2019-09-21 NOTE — ED Notes (Signed)
Per Lanetta Inch RN patient is unable to cough for sputum culture. Chrys Racer, RN followed up with this, patient stated "my mouth is too dry".

## 2019-09-21 NOTE — Progress Notes (Signed)
Pharmacy Antibiotic Note  Krystal Mckenzie is a 86 y.o. female admitted on 09/20/2019 with pneumonia, sepsis and UTI.  Pharmacy has been consulted for meropenem dosing.  Plan: Meropenem 1gm iv q24hr Height and weight still unknown but will use this dose due to SCr 4.5, might need to adjust when CrCl fully known     Temp (24hrs), Avg:98.6 F (37 C), Min:98.6 F (37 C), Max:98.6 F (37 C)  Recent Labs  Lab 09/20/19 2121 09/20/19 2144  WBC 10.6*  --   CREATININE 4.12* 4.50*  LATICACIDVEN 0.9  --     CrCl cannot be calculated (Unknown ideal weight.).    Allergies  Allergen Reactions  . Morphine And Related   . Cephalexin Hives, Swelling and Rash  . Penicillins Swelling and Rash    Did it involve swelling of the face/tongue/throat, SOB, or low BP? Unknown Did it involve sudden or severe rash/hives, skin peeling, or any reaction on the inside of your mouth or nose? Unknown Did you need to seek medical attention at a hospital or doctor's office? Unknown When did it last happen? Unknown If all above answers are "NO", may proceed with cephalosporin use.     Antimicrobials this admission: Meropenem 09/20/2019 >>  Dose adjustments this admission: -  Microbiology results: -  Thank you for allowing pharmacy to be a part of this patient's care.  Krystal Mckenzie 09/21/2019 3:50 AM

## 2019-09-21 NOTE — Progress Notes (Signed)
[facility]                                                                              Patient Demographics  Krystal Mckenzie, is a 86 y.o. female, DOB - [birth year], [id number]  Admit date - 09/20/2019   Admitting Physician Mavin Dyke Krystal Eaton, MD  Outpatient Primary MD for the patient is Patient, No Pcp Per  Outpatient specialists:   LOS - 1  days   Medical records reviewed and are as summarized below:    Chief Complaint  Patient presents with  . Shortness of Breath  . COVID POS Symptoms       Brief summary   Patient is a 86 year old female with history of chronic diastolic CHF, hypertension, hypothyroidism, CKD presented from [facility] facility for malaise, fatigue, hypoxia, recent COVID-19.  Patient was reportedly diagnosed with COVID-19 (unclear duration, less than a week ago per daughter, roughly 2 weeks ago per SNF), was placed on 2 to 3 L of supplemental O2, steroids at SNF.  Patient was also receiving antibiotics for recurrent UTI initially nitrofurantoin then changed to [place] in past 3 days.  Patient also reported her chronic leg edema has recently resolved though she is having tenderness involving her bilateral legs.  She reported frequent cough, occasionally productive, diarrhea has resolved.  Suprapubic pain and dysuria have also improved on the new antibiotic. In mid 90s on 3 L O2, slightly tachypneic, BP 100/30.  Chest x-ray bibasilar opacities left greater than right concerning for pneumonia, hazy interstitial opacities could reflect concomitant edema. K5.9, bicarb 21, creatinine 4.1, procalcitonin elevated to 1.3, Covid antigen negative D-dimer 8.95.  Patient was started on IV fluids, Norco, Lokelma and meropenem in ED.  COVID-19 confirmatory test positive   Assessment & Plan    Principal Problem:   Acute kidney injury superimposed on CKD stage IV, Hyperkalemia -Presented from SNF with recent diagnosis of COVID-19, was started on  antibiotics for UTI, creatinine 4.12, potassium 5.9 at the time of admission.  Creatinine was 1.93 a year ago. -Recently had diarrhea, was placed on IV fluid hydration and Lokelma in ED. -Creatinine worse today 4.5, creatinine 5.9 -Called nephrology consult, discussed with Dr. Augustin Coupe  Active Problems:  Acute hypoxic respiratory failure due to acute COVID-19 viral pneumonia during the ongoing COVID-19 pandemic- POA - Patient presented with malaise, hypoxia, fatigue, was placed on 3 L O2.  Chest x-ray showed bibasilar opacities left greater than right worrisome for pneumonia in the setting of COVID-19 -Continue prednisone, remdesivir per pharmacy protocol, if cleared by pharmacy given poor renal function -Elevated D-dimer, follow VQ scan, venous Dopplers lower extremity - Continue Supportive care: vitamin C/zinc, albuterol, Tylenol. - Continue to wean oxygen, ambulatory O2 screening daily as tolerated  - Oxygen - SpO2: 96 % - Continue to follow labs as below  Lab Results  Component Value Date   SARSCOV2NAA POSITIVE (A) 09/20/2019     Recent Labs  Lab 09/20/19 2121  DDIMER 8.95*  FERRITIN 189  CRP 4.7*  ALT 14  PROCALCITON 1.33    Recurrent UTIs -Recently placed on nitrofurantoin, then switched to Bactrim. -Placed on IV meropenem,  will continue.  Follow urine culture and sensitivities  Hypothyroidism Continue Synthroid  Hypotension with history of hypertension -Patient was given a liter of normal saline in ED, antihypertensives and diuretics are on hold. -Patient is on prednisone 50 mg daily outpatient, received IV hydrocortisone overnight.  Continue oral prednisone  Chronic diastolic CHF -Currently holding diuretics due to borderline BP, poor renal function -Follow volume status closely   Code Status: DNR DVT Prophylaxis: Heparin subcu Family Communication: Discussed all imaging results, lab results, explained to the patient  Disposition Plan: Remains inpatient,  awaiting bed.  Worsening renal function, borderline BP, hypoxia.  Time Spent in minutes 35 minutes  Procedures:  None  Consultants:   Nephrology, Dr. Augustin Coupe  Antimicrobials:   Anti-infectives (From admission, onward)   Start     Dose/Rate Route Frequency Ordered Stop   09/21/19 2200  meropenem (MERREM) 1 g in sodium chloride 0.9 % 100 mL IVPB     1 g 200 mL/hr over 30 Minutes Intravenous Daily at bedtime 09/21/19 0346     09/20/19 2200  meropenem (MERREM) 1 g in sodium chloride 0.9 % 100 mL IVPB  Status:  Discontinued     1 g 200 mL/hr over 30 Minutes Intravenous Every 8 hours 09/20/19 2147 09/21/19 0345          Medications  Scheduled Meds: . acidophilus  2 capsule Oral BID WC  . aspirin EC  81 mg Oral QPM  . febuxostat  80 mg Oral Daily  . heparin  7,500 Units Subcutaneous Q8H  . levothyroxine  75 mcg Oral Q0600  . LORazepam  0.5 mg Oral Daily  . pantoprazole  40 mg Oral Daily  . predniSONE  50 mg Oral QPM  . sodium chloride flush  3 mL Intravenous Q12H  . traZODone  50 mg Oral QHS   Continuous Infusions: . meropenem (MERREM) IV     PRN Meds:.acetaminophen **OR** acetaminophen, HYDROcodone-acetaminophen, ondansetron **OR** ondansetron (ZOFRAN) IV      Subjective:   Krystal Mckenzie was seen and examined today.  No fevers or chills, acute complaints.  No nausea vomiting or diarrhea.  On O2, 5 L at the time of my examination.    Objective:   Vitals:   09/21/19 1100 09/21/19 1115 09/21/19 1130 09/21/19 1200  BP: (!) 122/54  (!) 122/50 (!) 105/50  Pulse: 70 61 71 (!) 57  Resp: 17 18 (!) 22 17  Temp:      TempSrc:      SpO2: 97% 98% 94% 97%  Weight:      Height:       No intake or output data in the 24 hours ending 09/21/19 1259   Wt Readings from Last 3 Encounters:  09/21/19 68 kg     Exam  General: Alert and oriented x self and place, no acute distress  Eyes:  HEENT:  Atraumatic, normocephalic  Cardiovascular: S1 S2 auscultated, no murmurs,  RRR  Respiratory: Decreased breath sounds at the bases, no wheezing  Gastrointestinal: Soft, nontender, nondistended, + bowel sounds  Ext: no pedal edema bilaterally  Neuro: Moving all 4 extremities  Musculoskeletal: No digital cyanosis, clubbing  Skin: No rashes  Psych: Normal affect and demeanor, alert and oriented x to   Data Reviewed:  I have personally reviewed following labs and imaging studies  Micro Results Recent Results (from the past 240 hour(s))  SARS CORONAVIRUS 2 (TAT 6-24 HRS) Nasopharyngeal Nasopharyngeal Swab     Status: Abnormal   Collection Time: 09/20/19  10:44 PM   Specimen: Nasopharyngeal Swab  Result Value Ref Range Status   SARS Coronavirus 2 POSITIVE (A) NEGATIVE Final    Comment: RESULT CALLED TO, READ BACK BY AND VERIFIED WITH: H. COOK,RN 0630 09/21/2019 T. TYSOR (NOTE) SARS-CoV-2 target nucleic acids are DETECTED. The SARS-CoV-2 RNA is generally detectable in upper and lower respiratory specimens during the acute phase of infection. Positive results are indicative of the presence of SARS-CoV-2 RNA. Clinical correlation with patient history and other diagnostic information is  necessary to determine patient infection status. Positive results do not rule out bacterial infection or co-infection with other viruses.  The expected result is Negative. Fact Sheet for Patients: SugarRoll.be Fact Sheet for Healthcare Providers: https://www.woods-mathews.com/ This test is not yet approved or cleared by the [place] FDA and  has been authorized for detection and/or diagnosis of SARS-CoV-2 by FDA under an Emergency Use Authorization (EUA). This EUA will remain  in effect (meaning this test can be used) for the  duration of the COVID-19 declaration under Section 564(b)(1) of the Act, 21 U.S.C. section 360bbb-3(b)(1), unless the authorization is terminated or revoked sooner. Performed at [facility],  [place] [address], [place], [place] [address]   MRSA PCR Screening     Status: None   Collection Time: 09/21/19  5:43 AM   Specimen: Nasal Mucosa; Nasopharyngeal  Result Value Ref Range Status   MRSA by PCR NEGATIVE NEGATIVE Final    Comment:        The GeneXpert MRSA Assay (FDA approved for NASAL specimens only), is one component of a comprehensive MRSA colonization surveillance program. It is not intended to diagnose MRSA infection nor to guide or monitor treatment for MRSA infections. Performed at [facility], [place] [address], [place], [place] [address]     Radiology Reports DG Chest [place] 1 View  Result Date: 09/20/2019 CLINICAL DATA:  Shortness of breath, COVID-19 positive EXAM: PORTABLE CHEST 1 VIEW COMPARISON:  Radiograph 05/09/2011 FINDINGS: Patchy right basilar and dense retrocardiac opacities. Obscuration of left hemidiaphragm may reflect basilar consolidation or effusion. No visible pneumothorax. Biapical pleuroparenchymal scarring is seen. There is some cephalized vascularity with hazy interstitial opacities suggesting pulmonary edema. Tracheal tortuosity is similar to prior. The aorta is calcified. The remaining cardiomediastinal contours are unremarkable. No acute osseous or soft tissue abnormality. Degenerative changes are present in the imaged spine and shoulders. Telemetry leads and support devices overlie the chest. IMPRESSION: 1. Bibasilar opacities, left greater than right, worrisome for pneumonia in the setting of COVID-19. 2. Pulmonary vascular congestion and hazy interstitial opacities suggesting some concomitant edema. 3. Biapical pleuroparenchymal scarring. 4.  Aortic Atherosclerosis (ICD10-I70.0). Electronically Signed   By: Lovena Le M.D.   On: 09/20/2019 21:06    Lab Data:  CBC: Recent Labs  Lab 09/20/19 2121 09/20/19 2144 09/21/19 0543  WBC 10.6*  --  7.8  NEUTROABS 9.9*  --  6.9  HGB 11.3* 11.6* 11.0*  HCT 35.6* 34.0* 35.8*  MCV 96.0   --  97.3  PLT 304  --  [id number]   Basic Metabolic Panel: Recent Labs  Lab 09/20/19 2121 09/20/19 2144  NA 133* 134*  K 5.9* 5.9*  CL 103 105  CO2 21*  --   GLUCOSE 131* 126*  BUN 87* 86*  CREATININE 4.12* 4.50*  CALCIUM 8.8*  --    GFR: Estimated Creatinine Clearance: 7.3 mL/min (A) (by C-G formula based on SCr of 4.5 mg/dL (H)). Liver Function Tests: Recent Labs  Lab 09/20/19 2121  AST 20  ALT 14  ALKPHOS 72  BILITOT 0.4  PROT 6.6  ALBUMIN 3.0*   No results for input(s): LIPASE, AMYLASE in the last 168 hours. No results for input(s): AMMONIA in the last 168 hours. Coagulation Profile: No results for input(s): INR, PROTIME in the last 168 hours. Cardiac Enzymes: No results for input(s): CKTOTAL, CKMB, CKMBINDEX, TROPONINI in the last 168 hours. BNP (last 3 results) No results for input(s): PROBNP in the last 8760 hours. HbA1C: No results for input(s): HGBA1C in the last 72 hours. CBG: No results for input(s): GLUCAP in the last 168 hours. Lipid Profile: Recent Labs    09/20/19 2121  TRIG 126   Thyroid Function Tests: No results for input(s): TSH, T4TOTAL, FREET4, T3FREE, THYROIDAB in the last 72 hours. Anemia Panel: Recent Labs    09/20/19 2121  FERRITIN 189   Urine analysis:    Component Value Date/Time   COLORURINE AMBER (A) 09/20/2019 2100   APPEARANCEUR HAZY (A) 09/20/2019 2100   LABSPEC 1.013 09/20/2019 2100   PHURINE 5.0 09/20/2019 2100   GLUCOSEU NEGATIVE 09/20/2019 2100   HGBUR NEGATIVE 09/20/2019 2100   BILIRUBINUR NEGATIVE 09/20/2019 2100   KETONESUR NEGATIVE 09/20/2019 2100   PROTEINUR NEGATIVE 09/20/2019 2100   UROBILINOGEN 0.2 05/09/2011 1918   NITRITE NEGATIVE 09/20/2019 2100   LEUKOCYTESUR LARGE (A) 09/20/2019 2100     Krystal Mckenzie M.D. [facility] 09/21/2019, 12:59 PM   Call night coverage person covering after 7pm

## 2019-09-21 NOTE — ED Notes (Signed)
Date and time results received: 09/21/19 6:31 AM  (use smartphrase ".now" to insert current time)  Test: COVID Critical Value: Positive  Name of Provider Notified: Rai, MD  Orders Received? Or Actions Taken?:

## 2019-09-22 ENCOUNTER — Inpatient Hospital Stay (HOSPITAL_COMMUNITY): Payer: Medicare Other

## 2019-09-22 DIAGNOSIS — I5032 Chronic diastolic (congestive) heart failure: Secondary | ICD-10-CM

## 2019-09-22 DIAGNOSIS — N179 Acute kidney failure, unspecified: Secondary | ICD-10-CM

## 2019-09-22 DIAGNOSIS — E875 Hyperkalemia: Secondary | ICD-10-CM

## 2019-09-22 DIAGNOSIS — N170 Acute kidney failure with tubular necrosis: Secondary | ICD-10-CM

## 2019-09-22 DIAGNOSIS — J9600 Acute respiratory failure, unspecified whether with hypoxia or hypercapnia: Secondary | ICD-10-CM

## 2019-09-22 DIAGNOSIS — J1282 Pneumonia due to coronavirus disease 2019: Secondary | ICD-10-CM

## 2019-09-22 DIAGNOSIS — J189 Pneumonia, unspecified organism: Secondary | ICD-10-CM

## 2019-09-22 DIAGNOSIS — U071 COVID-19: Principal | ICD-10-CM

## 2019-09-22 DIAGNOSIS — N184 Chronic kidney disease, stage 4 (severe): Secondary | ICD-10-CM

## 2019-09-22 DIAGNOSIS — J9601 Acute respiratory failure with hypoxia: Secondary | ICD-10-CM

## 2019-09-22 LAB — BASIC METABOLIC PANEL
Anion gap: 6 (ref 5–15)
BUN: 74 mg/dL — ABNORMAL HIGH (ref 8–23)
CO2: 23 mmol/L (ref 22–32)
Calcium: 8.5 mg/dL — ABNORMAL LOW (ref 8.9–10.3)
Chloride: 109 mmol/L (ref 98–111)
Creatinine, Ser: 2.93 mg/dL — ABNORMAL HIGH (ref 0.44–1.00)
GFR calc Af Amer: 16 mL/min — ABNORMAL LOW (ref >60)
GFR calc non Af Amer: 14 mL/min — ABNORMAL LOW (ref >60)
Glucose, Bld: 111 mg/dL — ABNORMAL HIGH (ref 70–99)
Potassium: 4.9 mmol/L (ref 3.5–5.1)
Sodium: 138 mmol/L (ref 135–145)

## 2019-09-22 LAB — TYPE AND SCREEN
ABO/RH(D): O POS
Antibody Screen: NEGATIVE

## 2019-09-22 LAB — D-DIMER, QUANTITATIVE: D-Dimer, Quant: 11.48 ug/mL-FEU — ABNORMAL HIGH (ref 0.00–0.50)

## 2019-09-22 LAB — UREA NITROGEN, URINE: Urea Nitrogen, Ur: 363 mg/dL

## 2019-09-22 LAB — HEPATIC FUNCTION PANEL
ALT: 12 U/L (ref 0–44)
AST: 17 U/L (ref 15–41)
Albumin: 2.4 g/dL — ABNORMAL LOW (ref 3.5–5.0)
Alkaline Phosphatase: 55 U/L (ref 38–126)
Bilirubin, Direct: 0.1 mg/dL (ref 0.0–0.2)
Indirect Bilirubin: 0.5 mg/dL (ref 0.3–0.9)
Total Bilirubin: 0.6 mg/dL (ref 0.3–1.2)
Total Protein: 5.7 g/dL — ABNORMAL LOW (ref 6.5–8.1)

## 2019-09-22 LAB — PROCALCITONIN: Procalcitonin: 0.29 ng/mL

## 2019-09-22 LAB — C-REACTIVE PROTEIN: CRP: 1.5 mg/dL — ABNORMAL HIGH (ref <1.0)

## 2019-09-22 LAB — FERRITIN: Ferritin: 225 ng/mL (ref 11–307)

## 2019-09-22 MED ORDER — SODIUM CHLORIDE 0.9% IV SOLUTION: Freq: Once | INTRAVENOUS | Status: AC

## 2019-09-22 MED ORDER — METHYLPREDNISOLONE SODIUM SUCC 125 MG IJ SOLR
60.0000 mg | Freq: Two times a day (BID) | INTRAMUSCULAR | Status: DC
  Administered 2019-09-22 – 2019-09-28 (×12): 60 mg via INTRAVENOUS
  Filled 2019-09-22 (×12): qty 2

## 2019-09-22 MED ORDER — ENOXAPARIN SODIUM 80 MG/0.8ML ~~LOC~~ SOLN
1.0000 mg/kg | SUBCUTANEOUS | Status: DC
  Administered 2019-09-22 – 2019-09-26 (×5): 75 mg via SUBCUTANEOUS
  Filled 2019-09-22: qty 0.8
  Filled 2019-09-22: qty 0.75
  Filled 2019-09-22 (×3): qty 0.8

## 2019-09-22 NOTE — Progress Notes (Addendum)
[facility]                                                                              Patient Demographics  Krystal Mckenzie, is a 86 y.o. female, DOB - [birth year], [id number]  Admit date - 09/20/2019   Admitting Physician Laquetta Racey Krystal Eaton, MD  Outpatient Primary MD for the patient is Patient, No Pcp Per  Outpatient specialists:   LOS - 2  days   Medical records reviewed and are as summarized below:    Chief Complaint  Patient presents with  . Shortness of Breath  . COVID POS Symptoms       Brief summary   Patient is a 86 year old female with history of chronic diastolic CHF, hypertension, hypothyroidism, CKD presented from [place] facility for malaise, fatigue, hypoxia, recent COVID-19.  Patient was reportedly diagnosed with COVID-19 (unclear duration, less than a week ago per daughter, roughly 2 weeks ago per SNF), was placed on 2 to 3 L of supplemental O2, steroids at SNF.  Patient was also receiving antibiotics for recurrent UTI initially nitrofurantoin then changed to [place] in past 3 days.  Patient also reported her chronic leg edema has recently resolved though she is having tenderness involving her bilateral legs.  She reported frequent cough, occasionally productive, diarrhea has resolved.  Suprapubic pain and dysuria have also improved on the new antibiotic. In mid 90s on 3 L O2, slightly tachypneic, BP 100/30.  Chest x-ray bibasilar opacities left greater than right concerning for pneumonia, hazy interstitial opacities could reflect concomitant edema. K5.9, bicarb 21, creatinine 4.1, procalcitonin elevated to 1.3, Covid antigen negative D-dimer 8.95.  Patient was started on IV fluids, Norco, Lokelma and meropenem in ED.  COVID-19 confirmatory test positive   Assessment & Plan    Principal Problem:   Acute kidney injury superimposed on CKD stage IV, Hyperkalemia -Presented from SNF with recent diagnosis of COVID-19, was started on  antibiotics for UTI, creatinine 4.12, potassium 5.9 at the time of admission.  Creatinine was 1.93 a year ago. -Recently had diarrhea, patient was placed on IV fluid hydration, Lokelma in ED.   -Appreciate nephrology recommendations, creatinine now improving to 2.9 today   Active Problems:  Acute hypoxic respiratory failure due to acute COVID-19 viral pneumonia during the ongoing COVID-19 pandemic- POA - Patient presented with malaise, hypoxia, fatigue, was placed on 3 L O2 in the ED.  Chest x-ray showed bibasilar opacities left greater than right worrisome for pneumonia in the setting of COVID-19 -At the time of my examination this a.m. was on 5 L however now worsened to 13 L O2. -Patient was on prednisone, changed to IV Solu-Medrol 60 every 12 hours, continue IV remdesivir, day #2 -Obtain CRP, D-dimer, ferritin, procalcitonin -Given worsening hypoxia, transfuse convalescent plasma, consent obtained from the daughter, Manuela Schwartz - Actemra x1 pending CRP, LFTs, procalcitonin.  Discussed with patient's daughter Actemra off label use - she denies any known history of tuberculosis or hepatitis, not on immunosuppressive drugs or any chemotherapy, understands the risks and benefits and wants to proceed with Actemra treatment if required. - Continue Supportive care: vitamin C/zinc, albuterol, Tylenol. -Patient  unable to lie flat for the VQ scan, on therapeutic Lovenox until PE is ruled out.  Venous Dopplers negative for DVT - Continue to wean oxygen, ambulatory O2 screening daily as tolerated  - Oxygen - SpO2: 97 % O2 Flow Rate (L/min): 13 L/min  -  Continue to follow labs as below  Lab Results  Component Value Date   SARSCOV2NAA POSITIVE (A) 09/20/2019     Recent Labs  Lab 09/20/19 2121 09/22/19 1527  DDIMER 8.95* 11.48*  FERRITIN 189 225  CRP 4.7* 1.5*  ALT 14 12  PROCALCITON 1.33 0.29   Addendum:  D-dimer 11.48, procalcitonin 0.29, CRP trending down 1.5 -Likely worsening hypoxia due to  possibility of pulmonary embolism with COVID-19.  No DVT seen on venous Dopplers.  Unable to obtain VQ scan as patient is not able to lie flat, creatinine 2.9, hence unable to obtain CT angiogram -Continue therapeutic Lovenox.  -Inflammatory markers are trending down hence holding off Actemra, CRP only 1.5.  Will await PCCM recommendations, Dr. Valeta Harms to see the patient.   History of recurrent UTIs -Recently placed on nitrofurantoin, then switched to Bactrim.  Patient was placed on IV meropenem, day #3 today -UA showed large leukocytes, few bacteria, WBCs> 50.  Urine culture showed insignificant growth.  Blood cultures negative so far  Hypothyroidism Continue Synthroid  Hypotension with history of hypertension -BP now stable.  Antihypertensives on hold.  Chronic diastolic CHF -Currently holding diuretics due to borderline BP, poor renal function -Follow volume status closely   Code Status: DNR DVT Prophylaxis: Therapeutic Lovenox Family Communication: Discussed all imaging results, lab results, explained to the patient's daughter Manuela Schwartz and grandson, Dr. Eulas Post who is a physician in [place], on the phone Disposition Plan: Acute respiratory failure with hypoxia, worsening, on 13 L.  Transfer to [facility] when bed available.  Time Spent in minutes 35 minutes  Procedures:  None  Consultants:   Nephrology, Dr. Augustin Coupe CCM, Dr. Valeta Harms  Antimicrobials:   Anti-infectives (From admission, onward)   Start     Dose/Rate Route Frequency Ordered Stop   09/22/19 1000  remdesivir 100 mg in sodium chloride 0.9 % 100 mL IVPB     100 mg 200 mL/hr over 30 Minutes Intravenous Daily 09/21/19 1345 09/26/19 0959   09/21/19 2200  meropenem (MERREM) 1 g in sodium chloride 0.9 % 100 mL IVPB     1 g 200 mL/hr over 30 Minutes Intravenous Daily at bedtime 09/21/19 0346 09/23/19 2159   09/21/19 1600  remdesivir 200 mg in sodium chloride 0.9% 250 mL IVPB     200 mg 580 mL/hr over 30 Minutes Intravenous Once  09/21/19 1345 09/21/19 1726   09/20/19 2200  meropenem (MERREM) 1 g in sodium chloride 0.9 % 100 mL IVPB  Status:  Discontinued     1 g 200 mL/hr over 30 Minutes Intravenous Every 8 hours 09/20/19 2147 09/21/19 0345         Medications  Scheduled Meds: . sodium chloride   Intravenous Once  . acidophilus  2 capsule Oral BID WC  . aspirin EC  81 mg Oral QPM  . Chlorhexidine Gluconate Cloth  6 each Topical Daily  . enoxaparin (LOVENOX) injection  1 mg/kg Subcutaneous Q24H  . febuxostat  80 mg Oral Daily  . levothyroxine  75 mcg Oral Q0600  . LORazepam  0.5 mg Oral Daily  . mouth rinse  15 mL Mouth Rinse BID  . methylPREDNISolone (SOLU-MEDROL) injection  60 mg Intravenous Q12H  . pantoprazole  40 mg Oral Daily  . sodium chloride flush  3 mL Intravenous Q12H  . traZODone  50 mg Oral QHS   Continuous Infusions: . sodium chloride 10 mL/hr at 09/22/19 0946  . meropenem (MERREM) IV Stopped (09/21/19 2152)  . remdesivir 100 mg in NS 100 mL Stopped (09/22/19 0942)   PRN Meds:.sodium chloride, acetaminophen **OR** acetaminophen, HYDROcodone-acetaminophen, ondansetron **OR** ondansetron (ZOFRAN) IV      Subjective:   Jet Dietiker was seen and examined today.  No acute fevers or chills.  Hypoxia worsening, in a.m. was on 5 L O2, now worsened to 13 L O2 via nasal cannula.  No active nausea vomiting or diarrhea.   Objective:   Vitals:   09/22/19 1200 09/22/19 1336 09/22/19 1353 09/22/19 1400  BP: (!) 139/43   (!) 154/51  Pulse: 65 71  72  Resp: (!) 23 (!) 23  (!) 28  Temp:      TempSrc:      SpO2: 95% 93% 97% 97%  Weight:      Height:        Intake/Output Summary (Last 24 hours) at 09/22/2019 1716 Last data filed at 09/22/2019 1300 Gross per 24 hour  Intake 357.62 ml  Output 700 ml  Net -342.38 ml     Wt Readings from Last 3 Encounters:  09/22/19 75.2 kg   Physical Exam  General: Alert and oriented x self and place, short of breath  Eyes:   HEENT:   Atraumatic, normocephalic  Cardiovascular: S1 S2 clear, RRR. No pedal edema b/l  Respiratory: Diminished breath sounds bilaterally  Gastrointestinal: Soft, nontender, nondistended, NBS  Ext: no pedal edema bilaterally  Neuro: no new deficits  Musculoskeletal: No cyanosis, clubbing  Skin: No rashes  Psych: Normal affect and demeanor, alert and oriented x3    Data Reviewed:  I have personally reviewed following labs and imaging studies  Micro Results Recent Results (from the past 240 hour(s))  Blood Culture (routine x 2)     Status: None (Preliminary result)   Collection Time: 09/20/19  8:30 PM   Specimen: BLOOD  Result Value Ref Range Status   Specimen Description   Final    BLOOD RIGHT ANTECUBITAL Performed at [facility] [address] [place], [place] [address]    Special Requests   Final    BOTTLES DRAWN AEROBIC AND ANAEROBIC Blood Culture results may not be optimal due to an excessive volume of blood received in culture bottles Performed at [facility] [address] [place], [place] [address]    Culture   Final    NO GROWTH 1 DAY Performed at [facility], [place] [address] [place], [place] [address]    Report Status PENDING  Incomplete  Urine culture     Status: Abnormal   Collection Time: 09/20/19  9:00 PM   Specimen: Urine, Clean Catch  Result Value Ref Range Status   Specimen Description   Final    URINE, CLEAN CATCH Performed at [facility], [place] [address], [place], [place] [address]    Special Requests   Final    NONE Performed at [facility], [place] [address], [place], [place] [address]    Culture (A)  Final    <10,000 COLONIES/mL INSIGNIFICANT GROWTH Performed at [facility] [address], [place],  [address]    Report Status 09/21/2019 FINAL  Final  SARS CORONAVIRUS 2 (TAT 6-24 HRS) Nasopharyngeal Nasopharyngeal Swab  Status: Abnormal    Collection Time: 09/20/19 10:44 PM   Specimen: Nasopharyngeal Swab  Result Value Ref Range Status   SARS Coronavirus 2 POSITIVE (A) NEGATIVE Final    Comment: RESULT CALLED TO, READ BACK BY AND VERIFIED WITH: H. COOK,RN 0630 09/21/2019 T. TYSOR (NOTE) SARS-CoV-2 target nucleic acids are DETECTED. The SARS-CoV-2 RNA is generally detectable in upper and lower respiratory specimens during the acute phase of infection. Positive results are indicative of the presence of SARS-CoV-2 RNA. Clinical correlation with patient history and other diagnostic information is  necessary to determine patient infection status. Positive results do not rule out bacterial infection or co-infection with other viruses.  The expected result is Negative. Fact Sheet for Patients: SugarRoll.be Fact Sheet for Healthcare Providers: https://www.woods-mathews.com/ This test is not yet approved or cleared by the [place] FDA and  has been authorized for detection and/or diagnosis of SARS-CoV-2 by FDA under an Emergency Use Authorization (EUA). This EUA will remain  in effect (meaning this test can be used) for the  duration of the COVID-19 declaration under Section 564(b)(1) of the Act, 21 U.S.C. section 360bbb-3(b)(1), unless the authorization is terminated or revoked sooner. Performed at [facility], [place] [address], [place], [place] [address]   MRSA PCR Screening     Status: None   Collection Time: 09/21/19  5:43 AM   Specimen: Nasal Mucosa; Nasopharyngeal  Result Value Ref Range Status   MRSA by PCR NEGATIVE NEGATIVE Final    Comment:        The GeneXpert MRSA Assay (FDA approved for NASAL specimens only), is one component of a comprehensive MRSA colonization surveillance program. It is not intended to diagnose MRSA infection nor to guide or monitor treatment for MRSA infections. Performed at [facility], [place] [address]  [place], [place] [address]   MRSA PCR Screening     Status: None   Collection Time: 09/21/19  4:47 PM   Specimen: Nasal Mucosa; Nasopharyngeal  Result Value Ref Range Status   MRSA by PCR NEGATIVE NEGATIVE Final    Comment:        The GeneXpert MRSA Assay (FDA approved for NASAL specimens only), is one component of a comprehensive MRSA colonization surveillance program. It is not intended to diagnose MRSA infection nor to guide or monitor treatment for MRSA infections. Performed at [facility], [place] [address], [place], [place] [address]   Blood Culture (routine x 2)     Status: None (Preliminary result)   Collection Time: 09/21/19  7:11 PM   Specimen: BLOOD LEFT HAND  Result Value Ref Range Status   Specimen Description   Final    BLOOD LEFT HAND Performed at [facility] [address], [place], [place] [address]    Special Requests   Final    BOTTLES DRAWN AEROBIC AND ANAEROBIC Blood Culture adequate volume Performed at [facility] [address], [place], [place] [address]    Culture   Final    NO GROWTH < 12 HOURS Performed at [facility] [address], [place], [place] [address]    Report Status PENDING  Incomplete    Radiology Reports DG Chest Port 1 View  Result Date: 09/20/2019 CLINICAL DATA:  Shortness of breath, COVID-19 positive EXAM: PORTABLE CHEST 1 VIEW COMPARISON:  Radiograph 05/09/2011 FINDINGS: Patchy right basilar and dense retrocardiac opacities. Obscuration of left hemidiaphragm may reflect basilar consolidation or effusion. No visible pneumothorax. Biapical pleuroparenchymal  scarring is seen. There is some cephalized vascularity with hazy interstitial opacities suggesting pulmonary edema. Tracheal tortuosity is similar to prior. The aorta is calcified. The remaining cardiomediastinal contours are unremarkable. No acute osseous or soft tissue abnormality. Degenerative changes are present in  the imaged spine and shoulders. Telemetry leads and support devices overlie the chest. IMPRESSION: 1. Bibasilar opacities, left greater than right, worrisome for pneumonia in the setting of COVID-19. 2. Pulmonary vascular congestion and hazy interstitial opacities suggesting some concomitant edema. 3. Biapical pleuroparenchymal scarring. 4.  Aortic Atherosclerosis (ICD10-I70.0). Electronically Signed   By: Lovena Le M.D.   On: 09/20/2019 21:06   VAS [place] LOWER EXTREMITY VENOUS (DVT)  Result Date: 09/22/2019  Lower Venous Study Indications: Pain.  Risk Factors: COVID 19 positive. Limitations: Poor ultrasound/tissue interface and patient pain tolerance, patient immobility, patient positioning. Comparison Study: No prior studies. Performing Technologist: Oliver Hum RVT  Examination Guidelines: A complete evaluation includes B-mode imaging, spectral Doppler, color Doppler, and power Doppler as needed of all accessible portions of each vessel. Bilateral testing is considered an integral part of a complete examination. Limited examinations for reoccurring indications may be performed as noted.  +---------+---------------+---------+-----------+----------+--------------+ RIGHT    CompressibilityPhasicitySpontaneityPropertiesThrombus Aging +---------+---------------+---------+-----------+----------+--------------+ CFV      Full           Yes      Yes                                 +---------+---------------+---------+-----------+----------+--------------+ FV Prox  Full                                                        +---------+---------------+---------+-----------+----------+--------------+ FV Mid                  Yes      Yes                                 +---------+---------------+---------+-----------+----------+--------------+ FV Distal               Yes      Yes                                  +---------+---------------+---------+-----------+----------+--------------+ POP      Full           Yes      Yes                                 +---------+---------------+---------+-----------+----------+--------------+ PTV      Full                                                        +---------+---------------+---------+-----------+----------+--------------+ PERO  Not visualized +---------+---------------+---------+-----------+----------+--------------+   +---------+---------------+---------+-----------+----------+--------------+ LEFT     CompressibilityPhasicitySpontaneityPropertiesThrombus Aging +---------+---------------+---------+-----------+----------+--------------+ CFV                     Yes      Yes                                 +---------+---------------+---------+-----------+----------+--------------+ FV Prox                 Yes      Yes                                 +---------+---------------+---------+-----------+----------+--------------+ FV Mid                  Yes      Yes                                 +---------+---------------+---------+-----------+----------+--------------+ FV Distal                                             Not visualized +---------+---------------+---------+-----------+----------+--------------+ POP                                                   Not visualized +---------+---------------+---------+-----------+----------+--------------+ PTV                                                   Not visualized +---------+---------------+---------+-----------+----------+--------------+ PERO                                                  Not visualized +---------+---------------+---------+-----------+----------+--------------+     Summary: Right: There is no evidence of deep vein thrombosis in the lower extremity. However, portions of this  examination were limited- see technologist comments above. No cystic structure found in the popliteal fossa. Left: There is no evidence of deep vein thrombosis in the lower extremity. However, portions of this examination were limited- see technologist comments above. No cystic structure found in the popliteal fossa.  *See table(s) above for measurements and observations. Electronically signed by Deitra Mayo MD on 09/22/2019 at 12:53:25 PM.    Final     Lab Data:  CBC: Recent Labs  Lab 09/20/19 2121 09/20/19 2144 09/21/19 0543  WBC 10.6*  --  7.8  NEUTROABS 9.9*  --  6.9  HGB 11.3* 11.6* 11.0*  HCT 35.6* 34.0* 35.8*  MCV 96.0  --  97.3  PLT 304  --  [id number]   Basic Metabolic Panel: Recent Labs  Lab 09/20/19 2121 09/20/19 2144 09/21/19 1323 09/21/19 1911 09/22/19 1422  NA 133* 134* 136  --  138  K 5.9* 5.9* 5.1 4.6 4.9  CL 103 105 107  --  109  CO2 21*  --  21*  --  23  GLUCOSE 131* 126* 117*  --  111*  BUN 87* 86* 78*  --  74*  CREATININE 4.12* 4.50* 3.44*  --  2.93*  CALCIUM 8.8*  --  8.3*  --  8.5*   GFR: Estimated Creatinine Clearance: 12.1 mL/min (A) (by C-G formula based on SCr of 2.93 mg/dL (H)). Liver Function Tests: Recent Labs  Lab 09/20/19 2121 09/22/19 1527  AST 20 17  ALT 14 12  ALKPHOS 72 55  BILITOT 0.4 0.6  PROT 6.6 5.7*  ALBUMIN 3.0* 2.4*   No results for input(s): LIPASE, AMYLASE in the last 168 hours. No results for input(s): AMMONIA in the last 168 hours. Coagulation Profile: No results for input(s): INR, PROTIME in the last 168 hours. Cardiac Enzymes: No results for input(s): CKTOTAL, CKMB, CKMBINDEX, TROPONINI in the last 168 hours. BNP (last 3 results) No results for input(s): PROBNP in the last 8760 hours. HbA1C: No results for input(s): HGBA1C in the last 72 hours. CBG: Recent Labs  Lab 09/21/19 1650  GLUCAP 106*   Lipid Profile: Recent Labs    09/20/19 2121  TRIG 126   Thyroid Function Tests: No results for input(s):  TSH, T4TOTAL, FREET4, T3FREE, THYROIDAB in the last 72 hours. Anemia Panel: Recent Labs    09/20/19 2121 09/22/19 1527  FERRITIN 189 225   Urine analysis:    Component Value Date/Time   COLORURINE AMBER (A) 09/20/2019 2100   APPEARANCEUR HAZY (A) 09/20/2019 2100   LABSPEC 1.013 09/20/2019 2100   PHURINE 5.0 09/20/2019 2100   GLUCOSEU NEGATIVE 09/20/2019 2100   HGBUR NEGATIVE 09/20/2019 2100   BILIRUBINUR NEGATIVE 09/20/2019 2100   KETONESUR NEGATIVE 09/20/2019 2100   PROTEINUR NEGATIVE 09/20/2019 2100   UROBILINOGEN 0.2 05/09/2011 1918   NITRITE NEGATIVE 09/20/2019 2100   LEUKOCYTESUR LARGE (A) 09/20/2019 2100     Preslei Blakley M.D. [facility] 09/22/2019, 5:16 PM   Call night coverage person covering after 7pm

## 2019-09-22 NOTE — Progress Notes (Signed)
[facility] Progress Note   86 y.o. female dCHF HTN hypothyroidism CKD IIIb/IV who had a creatinine that was already 1.9 05/2011.  She was diagnosed with COVID19 less than a week ago and placed on supplemental O2 + steroids at SNF. Also being treated for recurrnet UTI with nitrofurantoin -> Bactrim for past 3 days prior to admission.   Assessment/ Plan:   1. Acute kidney injury on CKD 4 (creatinine already 1.9 05/2011) - will need records from outside PCP to determine progression. I tried calling both numbers listed in the chart without success. Acute component likely secondary to prerenal state (not consuming much + diarrhea + Bactrim) - She doesn't appear to have an obstruction but will still check a renal u/s for cortical thickness, size. - Continue hydration with isotonic fluids if she tolerates; will help with distal Na/K exchange. - She's very fortunate her renal function had started to improve as she is not a good candidate for dialysis (especially long term);  I'm not certain she will even tolerate short term iHD.  - Will order Stat chem panel and follow peripherally unless renal function is getting worse.  2. COVD19 PNA - per primary team currently on 3L O2.  3. Recurrent UTI's 4. H/o HTN but currently on low side - continue holding meds for now to incr critical organ perfusion. 5. dCHF  Subjective:   C/o dry mouth. Denies f/c/n/v.   Objective:   BP (!) 139/43   Pulse 71   Temp 98.6 F (37 C) (Oral)   Resp (!) 23   Ht 5\' 2"  (1.575 m)   Wt 75.2 kg   SpO2 97%   BMI 30.32 kg/m   Intake/Output Summary (Last 24 hours) at 09/22/2019 1353 Last data filed at 09/22/2019 1300 Gross per 24 hour  Intake 357.62 ml  Output 700 ml  Net -342.38 ml   Weight change: 4.36 kg  Physical Exam: General appearance: alert and cooperative Back: negative, symmetric, no curvature.  Resp: Poor air movement Cardio: regular rate and rhythm GI: soft, NDNT Extremities: edema 1+  and tender b/l Pulses: 2+ and symmetric  Imaging: DG Chest Port 1 View  Result Date: 09/20/2019 CLINICAL DATA:  Shortness of breath, COVID-19 positive EXAM: PORTABLE CHEST 1 VIEW COMPARISON:  Radiograph 05/09/2011 FINDINGS: Patchy right basilar and dense retrocardiac opacities. Obscuration of left hemidiaphragm may reflect basilar consolidation or effusion. No visible pneumothorax. Biapical pleuroparenchymal scarring is seen. There is some cephalized vascularity with hazy interstitial opacities suggesting pulmonary edema. Tracheal tortuosity is similar to prior. The aorta is calcified. The remaining cardiomediastinal contours are unremarkable. No acute osseous or soft tissue abnormality. Degenerative changes are present in the imaged spine and shoulders. Telemetry leads and support devices overlie the chest. IMPRESSION: 1. Bibasilar opacities, left greater than right, worrisome for pneumonia in the setting of COVID-19. 2. Pulmonary vascular congestion and hazy interstitial opacities suggesting some concomitant edema. 3. Biapical pleuroparenchymal scarring. 4.  Aortic Atherosclerosis (ICD10-I70.0). Electronically Signed   By: Lovena Le M.D.   On: 09/20/2019 21:06   VAS [place] LOWER EXTREMITY VENOUS (DVT)  Result Date: 09/22/2019  Lower Venous Study Indications: Pain.  Risk Factors: COVID 19 positive. Limitations: Poor ultrasound/tissue interface and patient pain tolerance, patient immobility, patient positioning. Comparison Study: No prior studies. Performing Technologist: Oliver Hum RVT  Examination Guidelines: A complete evaluation includes B-mode imaging, spectral Doppler, color Doppler, and power Doppler as needed of all accessible portions of each vessel. Bilateral testing is considered an integral part  of a complete examination. Limited examinations for reoccurring indications may be performed as noted.  +---------+---------------+---------+-----------+----------+--------------+ RIGHT     CompressibilityPhasicitySpontaneityPropertiesThrombus Aging +---------+---------------+---------+-----------+----------+--------------+ CFV      Full           Yes      Yes                                 +---------+---------------+---------+-----------+----------+--------------+ FV Prox  Full                                                        +---------+---------------+---------+-----------+----------+--------------+ FV Mid                  Yes      Yes                                 +---------+---------------+---------+-----------+----------+--------------+ FV Distal               Yes      Yes                                 +---------+---------------+---------+-----------+----------+--------------+ POP      Full           Yes      Yes                                 +---------+---------------+---------+-----------+----------+--------------+ PTV      Full                                                        +---------+---------------+---------+-----------+----------+--------------+ PERO                                                  Not visualized +---------+---------------+---------+-----------+----------+--------------+   +---------+---------------+---------+-----------+----------+--------------+ LEFT     CompressibilityPhasicitySpontaneityPropertiesThrombus Aging +---------+---------------+---------+-----------+----------+--------------+ CFV                     Yes      Yes                                 +---------+---------------+---------+-----------+----------+--------------+ FV Prox                 Yes      Yes                                 +---------+---------------+---------+-----------+----------+--------------+ FV Mid                  Yes      Yes                                 +---------+---------------+---------+-----------+----------+--------------+  FV Distal                                              Not visualized +---------+---------------+---------+-----------+----------+--------------+ POP                                                   Not visualized +---------+---------------+---------+-----------+----------+--------------+ PTV                                                   Not visualized +---------+---------------+---------+-----------+----------+--------------+ PERO                                                  Not visualized +---------+---------------+---------+-----------+----------+--------------+     Summary: Right: There is no evidence of deep vein thrombosis in the lower extremity. However, portions of this examination were limited- see technologist comments above. No cystic structure found in the popliteal fossa. Left: There is no evidence of deep vein thrombosis in the lower extremity. However, portions of this examination were limited- see technologist comments above. No cystic structure found in the popliteal fossa.  *See table(s) above for measurements and observations. Electronically signed by Deitra Mayo MD on 09/22/2019 at 12:53:25 PM.    Final     Labs: BMET Recent Labs  Lab 09/20/19 2121 09/20/19 2144 09/21/19 1323 09/21/19 1911  NA 133* 134* 136  --   K 5.9* 5.9* 5.1 4.6  CL 103 105 107  --   CO2 21*  --  21*  --   GLUCOSE 131* 126* 117*  --   BUN 87* 86* 78*  --   CREATININE 4.12* 4.50* 3.44*  --   CALCIUM 8.8*  --  8.3*  --    CBC Recent Labs  Lab 09/20/19 2121 09/20/19 2144 09/21/19 0543  WBC 10.6*  --  7.8  NEUTROABS 9.9*  --  6.9  HGB 11.3* 11.6* 11.0*  HCT 35.6* 34.0* 35.8*  MCV 96.0  --  97.3  PLT 304  --  301    Medications:    . acidophilus  2 capsule Oral BID WC  . aspirin EC  81 mg Oral QPM  . Chlorhexidine Gluconate Cloth  6 each Topical Daily  . enoxaparin (LOVENOX) injection  1 mg/kg Subcutaneous Q24H  . febuxostat  80 mg Oral Daily  . levothyroxine  75 mcg Oral Q0600  . LORazepam  0.5 mg Oral  Daily  . mouth rinse  15 mL Mouth Rinse BID  . pantoprazole  40 mg Oral Daily  . predniSONE  50 mg Oral QPM  . sodium chloride flush  3 mL Intravenous Q12H  . traZODone  50 mg Oral QHS      Otelia Santee, MD 09/22/2019, 1:53 PM

## 2019-09-22 NOTE — Progress Notes (Signed)
Report called to [employer] at [facility]  Daughter updated the patient will be going to room 9134.  [facility] notified patient is ready for transport.  Awaiting pick up time.

## 2019-09-22 NOTE — Consult Note (Addendum)
NAME:  Krystal Mckenzie, MRN:  [id number], DOB:  [birth year], LOS: 2 ADMISSION DATE:  09/20/2019, CONSULTATION DATE: 09/22/2019 REFERRING MD: Dr. Nira Conn, CHIEF COMPLAINT: COVID-19 pneumonia  Brief History   86 year old female past medical history of chronic diastolic heart failure, hypertension, hypothyroidism, CKD 3 was at skilled nursing facility found to be positive for Covid also being treated for a urinary tract infection.  Patient has had increasing oxygen requirements.  Pulmonary consulted for recommendations and management.  History of present illness   86 year old female, past medical history of chronic diastolic heart failure, hypertension, hypothyroidism, CKD 3, lives at skilled nursing facility.  Was being treated for her urinary tract infection.  Admit to the hospital also found to be Covid positive.  Initially on a few liters nasal cannula now up to 15 L nasal cannula.  It does not appear that per documentation her initial Covid diagnosis was either approximately 1 to 2 weeks ago.  I have not seen the report myself.  Initially she was on 2 to 3 L of supplemental oxygen.  She also has chronic lower extremity swelling.  Upon arrival to the emergency room she was placed on nasal cannula O2.  Chest x-ray with bilateral lateral parenchymal opacities.  Her procalcitonin was 1.3, D-dimer of 8.95, serum creatinine 4.1 with a BUN of 87.  Patient's initial point-of-care antigen test was negative.  However PCR positive.  Patient was mated to the hospital on isolation.  Talk with the patient at bedside today she has no complaints.  She states that her breathing is maybe slightly better.  She does not feel very labored.  She able to speak in complete sentences.  She is slightly confused as to where she is.  But is interested in watching TV.  Past Medical History   Past Medical History:  Diagnosis Date  . Hypertension   . Hypothyroid      [facility] admission  09/21/2019  Consults:  Nephrology Pulmonary critical care  Procedures:    Significant Diagnostic Tests:    Micro Data:  09/20/2019: COVID-19 positive MRSA PCR negative Blood cultures no growth to date Urine culture: Less than 10,000 colonies no significant growth.  Antimicrobials:  Meropenem  Interim history/subjective:  Please see HPI above  Objective   Blood pressure (!) 139/43, pulse 71, temperature 98.6 F (37 C), temperature source Oral, resp. rate (!) 23, height 5\' 2"  (1.575 m), weight 75.2 kg, SpO2 97 %.        Intake/Output Summary (Last 24 hours) at 09/22/2019 1439 Last data filed at 09/22/2019 1300 Gross per 24 hour  Intake 357.62 ml  Output 700 ml  Net -342.38 ml   Filed Weights   09/21/19 0549 09/21/19 1700 09/22/19 0500  Weight: 68 kg 72.4 kg 75.2 kg    Examination: General: Overly female, resting in bed in no significant distress watching television HENT: NCAT, sclera clear Lungs: Clear to auscultation bilaterally no crackles no wheeze Cardiovascular: Regular rate and rhythm, S1-S2 Abdomen: Soft nontender nondistended Extremities: She does have bilateral lower extremity edema and tenderness to palpation along the anterior shins. Neuro: Able to follow commands moves all 4 extremities spontaneously.  Alert to person.  Confused about where she is GU: Deferred  Resolved Hospital Problem list     Assessment & Plan:   Acute hypoxemic respiratory failure secondary to bilateral COVID-19 pneumonia -Increasing O2 demand. -Switch from oral prednisone to IV. -Consider course of IV Decadron -Complete 5 days remdesivir -Recheck CRP, ferritin  D-dimer -Also with elevated D-dimer on admission, agree with lower extremity Dopplers, which are negative -Would continue weight-based dosing Covid protocol Lovenox  Acute kidney injury in the setting of chronic kidney disease -Was on Bactrim prior due to UTI history. -Appears that serum creatinine is  improving. -Continue management per nephrology.  History of recurrent urinary tract infections. -Currently on IV meropenem, antibiotic dosing per primary.  Hypothyroidism Hypertension Diastolic heart failure. -Avoid additional diuresis due to AKI at this time.  Goals of care: Agree with current DNR status.  At this time would recommend continue current Covid management.  Pulmonary will follow along as needed.  Please do not hesitate to call with any questions or concerns.   Best practice:  Diet: Advance as tolerated Pain/Anxiety/Delirium protocol (if indicated): na VAP protocol (if indicated): na DVT prophylaxis: lovenox  GI prophylaxis: ppi while on steroids Glucose control: SSI Mobility: Bedrest Code Status: DNR Family Communication: Per primary Disposition: PCU   Labs   CBC: Recent Labs  Lab 09/20/19 2121 09/20/19 2144 09/21/19 0543  WBC 10.6*  --  7.8  NEUTROABS 9.9*  --  6.9  HGB 11.3* 11.6* 11.0*  HCT 35.6* 34.0* 35.8*  MCV 96.0  --  97.3  PLT 304  --  [id number]    Basic Metabolic Panel: Recent Labs  Lab 09/20/19 2121 09/20/19 2144 09/21/19 1323 09/21/19 1911  NA 133* 134* 136  --   K 5.9* 5.9* 5.1 4.6  CL 103 105 107  --   CO2 21*  --  21*  --   GLUCOSE 131* 126* 117*  --   BUN 87* 86* 78*  --   CREATININE 4.12* 4.50* 3.44*  --   CALCIUM 8.8*  --  8.3*  --    GFR: Estimated Creatinine Clearance: 10.3 mL/min (A) (by C-G formula based on SCr of 3.44 mg/dL (H)). Recent Labs  Lab 09/20/19 2121 09/21/19 0543  PROCALCITON 1.33  --   WBC 10.6* 7.8  LATICACIDVEN 0.9  --     Liver Function Tests: Recent Labs  Lab 09/20/19 2121  AST 20  ALT 14  ALKPHOS 72  BILITOT 0.4  PROT 6.6  ALBUMIN 3.0*   No results for input(s): LIPASE, AMYLASE in the last 168 hours. No results for input(s): AMMONIA in the last 168 hours.  ABG    Component Value Date/Time   HCO3 22.2 09/20/2019 2121   TCO2 24 09/20/2019 2144   ACIDBASEDEF 2.4 (H) 09/20/2019 2121    O2SAT 85.6 09/20/2019 2121     Coagulation Profile: No results for input(s): INR, PROTIME in the last 168 hours.  Cardiac Enzymes: No results for input(s): CKTOTAL, CKMB, CKMBINDEX, TROPONINI in the last 168 hours.  HbA1C: No results found for: HGBA1C  CBG: Recent Labs  Lab 09/21/19 1650  GLUCAP 106*    Review of Systems:   Review of Systems  Constitutional: Negative for chills, fever, malaise/fatigue and weight loss.  HENT: Negative for hearing loss, sore throat and tinnitus.   Eyes: Negative for blurred vision and double vision.  Respiratory: Positive for cough and shortness of breath. Negative for hemoptysis, sputum production, wheezing and stridor.   Cardiovascular: Negative for chest pain, palpitations, orthopnea, leg swelling and PND.  Gastrointestinal: Negative for abdominal pain, constipation, diarrhea, heartburn, nausea and vomiting.  Genitourinary: Negative for dysuria, hematuria and urgency.  Musculoskeletal: Negative for joint pain and myalgias.  Skin: Negative for itching and rash.  Neurological: Negative for dizziness, tingling, weakness and headaches.  Endo/Heme/Allergies: Negative  for environmental allergies. Does not bruise/bleed easily.  Psychiatric/Behavioral: Negative for depression. The patient is not nervous/anxious and does not have insomnia.   All other systems reviewed and are negative.    Past Medical History  She,  has a past medical history of Hypertension and Hypothyroid.   Surgical History    Past Surgical History:  Procedure Laterality Date  . JOINT REPLACEMENT     Left knee  . REPLACEMENT TOTAL KNEE       Social History   reports that she has never smoked. She has never used smokeless tobacco. She reports that she does not drink alcohol or use drugs.   Family History   Her family history is not on file.   Allergies Allergies  Allergen Reactions  . Morphine And Related   . Cephalexin Hives, Swelling and Rash  . Penicillins  Swelling and Rash    Did it involve swelling of the face/tongue/throat, SOB, or low BP? Unknown Did it involve sudden or severe rash/hives, skin peeling, or any reaction on the inside of your mouth or nose? Unknown Did you need to seek medical attention at a hospital or doctor's office? Unknown When did it last happen? Unknown If all above answers are "NO", may proceed with cephalosporin use.      Home Medications  Prior to Admission medications   Medication Sig Start Date End Date Taking? Authorizing Provider  acetaminophen (TYLENOL) 325 MG tablet Take 650 mg by mouth every 4 (four) hours as needed for fever.   Yes [provider]  amLODipine (NORVASC) 5 MG tablet Take 5 mg by mouth daily. 09/15/19  Yes [provider]  ascorbic acid (VITAMIN C) 500 MG tablet Take 500 mg by mouth daily. 09/09/19 09/22/19 Yes [provider]  aspirin EC 81 MG tablet Take 81 mg by mouth every evening. 09/09/19 09/22/19 Yes [provider]  AZO-CRANBERRY PO Take 1 tablet by mouth 2 (two) times daily. AZO cranberry + Probiotic 250mg -30mg -50 million cell   Yes [provider]  b complex vitamins tablet Take 1 tablet by mouth daily. 09/09/19 09/22/19 Yes [provider]  calcium-vitamin D (OS-CAL 500 + D) 500-200 MG-UNIT per tablet Take 1 tablet by mouth daily.     Yes [provider]  Cholecalciferol (VITAMIN D3) 50 MCG (2000 UT) TABS Take 50 mcg by mouth daily.   Yes [provider]  clindamycin (CLEOCIN) 150 MG capsule Take 600 mg by mouth See admin instructions. 600 mg PO x1 one hour prior to all dental appointments   Yes [provider]  Febuxostat 80 MG TABS Take 80 mg by mouth daily. 09/15/19  Yes [provider]  hydrALAZINE (APRESOLINE) 25 MG tablet Take 25 mg by mouth 3 (three) times daily.   Yes [provider]  HYDROcodone-acetaminophen (NORCO/VICODIN) 5-325 MG tablet Take 1 tablet by mouth 3 (three) times daily.  09/14/19  Yes [provider]  isosorbide dinitrate (ISORDIL) 20 MG tablet Take 40 mg by mouth 2 (two) times daily. 800 and 2130 08/07/19  Yes [provider]  isosorbide dinitrate (ISORDIL) 20 MG tablet Take 20 mg by mouth daily. Midday (1330) 05/20/19  Yes [provider]  lactobacillus acidophilus (BACID) TABS tablet Take 2 tablets by mouth 2 (two) times daily with a meal.   Yes [provider]  levothyroxine (SYNTHROID, LEVOTHROID) 75 MCG tablet Take 75 mcg by mouth daily.     Yes [provider]  LORazepam (ATIVAN) 0.5 MG tablet Take  0.5 mg by mouth daily.   Yes [provider]  metoprolol succinate (TOPROL-XL) 25 MG 24 hr tablet Take 25 mg by mouth daily.   Yes [provider]  multivitamin-iron-minerals-folic acid (CENTRUM) chewable tablet Chew 1 tablet by mouth daily.   Yes [provider]  neomycin-polymyxin b-dexamethasone (MAXITROL) 3.5-10000-0.1 OINT Place 1 application into the right eye at bedtime as needed (eye swelling, eye infection).   Yes [provider]  omeprazole (PRILOSEC) 20 MG capsule Take 20 mg by mouth daily. 08/17/19  Yes [provider]  phenylephrine-shark liver oil-mineral oil-petrolatum (PREPARATION H) 0.25-14-74.9 % rectal ointment Place 1 application rectally 4 (four) times daily as needed for hemorrhoids.   Yes [provider]  polyethylene glycol (MIRALAX / GLYCOLAX) 17 g packet Take 17 g by mouth daily as needed.   Yes [provider]  potassium chloride (KLOR-CON) 10 MEQ tablet Take 10 mEq by mouth daily.   Yes [provider]  predniSONE (DELTASONE) 50 MG tablet Take 50 mg by mouth every evening. 09/18/19 09/22/19 Yes [provider]  senna-docusate (SENNA S) 8.6-50 MG tablet Take 1 tablet by mouth 2 (two) times daily.   Yes [provider]  sulfamethoxazole-trimethoprim (BACTRIM) 400-80 MG tablet Take 1 tablet by mouth 2 (two) times  daily. 09/18/19 09/25/19 Yes [provider]  torsemide (DEMADEX) 10 MG tablet Take 10 mg by mouth See admin instructions. 10 mg PO five times weekly (Tuesdays, Wednesdays, Fridays, Saturdays, and Sundays) 08/17/19  Yes [provider]  torsemide (DEMADEX) 20 MG tablet Take 20 mg by mouth 2 (two) times a week. On Mondays and Thursdays 09/15/19  Yes [provider]  traZODone (DESYREL) 50 MG tablet Take 50 mg by mouth at bedtime.   Yes [provider]  zinc gluconate 50 MG tablet Take 50 mg by mouth daily. 09/09/19 09/22/19 Yes [provider]  nitrofurantoin, macrocrystal-monohydrate, (MACROBID) 100 MG capsule Take 100 mg by mouth 2 (two) times daily. 09/15/19   [provider]    This patient is critically ill with multiple organ system failure; which, requires frequent high complexity decision making, assessment, support, evaluation, and titration of therapies. This was completed through the application of advanced monitoring technologies and extensive interpretation of multiple databases. During this encounter critical care time was devoted to patient care services described in this note for 32 minutes.   Garner Nash, DO [facility] 09/22/2019 2:40 PM

## 2019-09-22 NOTE — Progress Notes (Signed)
ANTICOAGULATION CONSULT NOTE - Initial Consult  Pharmacy Consult for LMWH Indication: r/o PE, elevated D dimer, unable to do CTA 2nd AKI  Allergies  Allergen Reactions  . Morphine And Related   . Cephalexin Hives, Swelling and Rash  . Penicillins Swelling and Rash    Did it involve swelling of the face/tongue/throat, SOB, or low BP? Unknown Did it involve sudden or severe rash/hives, skin peeling, or any reaction on the inside of your mouth or nose? Unknown Did you need to seek medical attention at a hospital or doctor's office? Unknown When did it last happen? Unknown If all above answers are "NO", may proceed with cephalosporin use.     Patient Measurements: Height: 5\' 2"  (157.5 cm) Weight: 165 lb 12.6 oz (75.2 kg) IBW/kg (Calculated) : 50.1 Heparin Dosing Weight: 65.6 kg  Vital Signs: Temp: 97.9 F (36.6 C) (01/19 0800) Temp Source: Oral (01/19 0800) BP: 145/45 (01/19 0800) Pulse Rate: 57 (01/19 0800)  Labs: Recent Labs    09/20/19 2121 09/20/19 2121 09/20/19 2144 09/21/19 0543 09/21/19 1323  HGB 11.3*   < > 11.6* 11.0*  --   HCT 35.6*  --  34.0* 35.8*  --   PLT 304  --   --  301  --   CREATININE 4.12*  --  4.50*  --  3.44*  TROPONINIHS 14  --   --   --   --    < > = values in this interval not displayed.    Estimated Creatinine Clearance: 10.3 mL/min (A) (by C-G formula based on SCr of 3.44 mg/dL (H)).   Medical History: Past Medical History:  Diagnosis Date  . Hypertension   . Hypothyroid     Medications:  Medications Prior to Admission  Medication Sig Dispense Refill Last Dose  . acetaminophen (TYLENOL) 325 MG tablet Take 650 mg by mouth every 4 (four) hours as needed for fever.   Unknown  . amLODipine (NORVASC) 5 MG tablet Take 5 mg by mouth daily.   09/20/2019 at Unknown time  . ascorbic acid (VITAMIN C) 500 MG tablet Take 500 mg by mouth daily.   09/20/2019 at Unknown time  . aspirin EC 81 MG tablet Take 81 mg by mouth every evening.    09/19/2019 at Unknown time  . AZO-CRANBERRY PO Take 1 tablet by mouth 2 (two) times daily. AZO cranberry + Probiotic 250mg -30mg -50 million cell   09/20/2019 at Unknown time  . b complex vitamins tablet Take 1 tablet by mouth daily.   09/20/2019 at Unknown time  . calcium-vitamin D (OS-CAL 500 + D) 500-200 MG-UNIT per tablet Take 1 tablet by mouth daily.     09/20/2019 at Unknown time  . Cholecalciferol (VITAMIN D3) 50 MCG (2000 UT) TABS Take 50 mcg by mouth daily.   09/20/2019 at Unknown time  . clindamycin (CLEOCIN) 150 MG capsule Take 600 mg by mouth See admin instructions. 600 mg PO x1 one hour prior to all dental appointments   Unknown  . Febuxostat 80 MG TABS Take 80 mg by mouth daily.   09/20/2019 at Unknown time  . hydrALAZINE (APRESOLINE) 25 MG tablet Take 25 mg by mouth 3 (three) times daily.   09/20/2019 at Unknown time  . HYDROcodone-acetaminophen (NORCO/VICODIN) 5-325 MG tablet Take 1 tablet by mouth 3 (three) times daily.   09/20/2019 at Unknown time  . isosorbide dinitrate (ISORDIL) 20 MG tablet Take 40 mg by mouth 2 (two) times daily. 800 and 2130   09/20/2019 at Unknown  time  . isosorbide dinitrate (ISORDIL) 20 MG tablet Take 20 mg by mouth daily. Midday (1330)   09/20/2019 at Unknown time  . lactobacillus acidophilus (BACID) TABS tablet Take 2 tablets by mouth 2 (two) times daily with a meal.   09/20/2019 at Unknown time  . levothyroxine (SYNTHROID, LEVOTHROID) 75 MCG tablet Take 75 mcg by mouth daily.     09/20/2019 at Unknown time  . LORazepam (ATIVAN) 0.5 MG tablet Take 0.5 mg by mouth daily.   09/20/2019 at Unknown time  . metoprolol succinate (TOPROL-XL) 25 MG 24 hr tablet Take 25 mg by mouth daily.   09/20/2019 at 800AM  . multivitamin-iron-minerals-folic acid (CENTRUM) chewable tablet Chew 1 tablet by mouth daily.   09/20/2019 at Unknown time  . neomycin-polymyxin b-dexamethasone (MAXITROL) 3.5-10000-0.1 OINT Place 1 application into the right eye at bedtime as needed (eye swelling, eye  infection).   Unknown  . omeprazole (PRILOSEC) 20 MG capsule Take 20 mg by mouth daily.     . phenylephrine-shark liver oil-mineral oil-petrolatum (PREPARATION H) 0.25-14-74.9 % rectal ointment Place 1 application rectally 4 (four) times daily as needed for hemorrhoids.   Unknown  . polyethylene glycol (MIRALAX / GLYCOLAX) 17 g packet Take 17 g by mouth daily as needed.   unknown  . potassium chloride (KLOR-CON) 10 MEQ tablet Take 10 mEq by mouth daily.   09/20/2019 at Unknown time  . predniSONE (DELTASONE) 50 MG tablet Take 50 mg by mouth every evening.   09/19/2019 at Unknown time  . senna-docusate (SENNA S) 8.6-50 MG tablet Take 1 tablet by mouth 2 (two) times daily.   09/20/2019 at Unknown time  . sulfamethoxazole-trimethoprim (BACTRIM) 400-80 MG tablet Take 1 tablet by mouth 2 (two) times daily.   09/20/2019 at Unknown time  . torsemide (DEMADEX) 10 MG tablet Take 10 mg by mouth See admin instructions. 10 mg PO five times weekly (Tuesdays, Wednesdays, Fridays, Saturdays, and Sundays)   09/20/2019 at Unknown time  . torsemide (DEMADEX) 20 MG tablet Take 20 mg by mouth 2 (two) times a week. On Mondays and Thursdays   Past Week at Unknown time  . traZODone (DESYREL) 50 MG tablet Take 50 mg by mouth at bedtime.   09/19/2019 at Unknown time  . zinc gluconate 50 MG tablet Take 50 mg by mouth daily.   09/20/2019 at Unknown time  . nitrofurantoin, macrocrystal-monohydrate, (MACROBID) 100 MG capsule Take 100 mg by mouth 2 (two) times daily.   09/18/2019    Assessment: 86 yo F with Covid.  Pharmacy consulted to start full dose LMWH for r/o PE.  D dimer on 1/17 = 8.95.  Unable to do CTA 2nd AKI.  SCr 4.5>>3.44.  CrCl < 30 ml/min.  1/19 dopplers negative for DVT but limited exam due to pain. Hg 11, PLTC WNL.  No bleeding reported.   Goal of Therapy:  Anti-Xa level 0.6-1 units/ml 4hrs after LMWH dose given Monitor platelets by anticoagulation protocol: Yes   Plan:  DC heparin 7,500 sq q8h Start LMWH 75 q24   F/u renal fxn and CBC  Eudelia Bunch, Pharm.D [phone] 09/22/2019 9:56 AM

## 2019-09-23 LAB — PREPARE FRESH FROZEN PLASMA

## 2019-09-23 LAB — COMPREHENSIVE METABOLIC PANEL
ALT: 10 U/L (ref 0–44)
AST: 17 U/L (ref 15–41)
Albumin: 2.4 g/dL — ABNORMAL LOW (ref 3.5–5.0)
Alkaline Phosphatase: 53 U/L (ref 38–126)
Anion gap: 7 (ref 5–15)
BUN: 74 mg/dL — ABNORMAL HIGH (ref 8–23)
CO2: 21 mmol/L — ABNORMAL LOW (ref 22–32)
Calcium: 8.3 mg/dL — ABNORMAL LOW (ref 8.9–10.3)
Chloride: 106 mmol/L (ref 98–111)
Creatinine, Ser: 2.5 mg/dL — ABNORMAL HIGH (ref 0.44–1.00)
GFR calc Af Amer: 19 mL/min — ABNORMAL LOW (ref >60)
GFR calc non Af Amer: 16 mL/min — ABNORMAL LOW (ref >60)
Glucose, Bld: 138 mg/dL — ABNORMAL HIGH (ref 70–99)
Potassium: 5.6 mmol/L — ABNORMAL HIGH (ref 3.5–5.1)
Sodium: 134 mmol/L — ABNORMAL LOW (ref 135–145)
Total Bilirubin: 0.5 mg/dL (ref 0.3–1.2)
Total Protein: 5.7 g/dL — ABNORMAL LOW (ref 6.5–8.1)

## 2019-09-23 LAB — CBC
HCT: 34.1 % — ABNORMAL LOW (ref 36.0–46.0)
Hemoglobin: 10.9 g/dL — ABNORMAL LOW (ref 12.0–15.0)
MCH: 30.8 pg (ref 26.0–34.0)
MCHC: 32 g/dL (ref 30.0–36.0)
MCV: 96.3 fL (ref 80.0–100.0)
Platelets: 275 10*3/uL (ref 150–400)
RBC: 3.54 MIL/uL — ABNORMAL LOW (ref 3.87–5.11)
RDW: 14.6 % (ref 11.5–15.5)
WBC: 8.3 10*3/uL (ref 4.0–10.5)
nRBC: 0 % (ref 0.0–0.2)

## 2019-09-23 LAB — BPAM FFP
Blood Product Expiration Date: 202101201630
ISSUE DATE / TIME: 202101191711
Unit Type and Rh: 5100

## 2019-09-23 LAB — C-REACTIVE PROTEIN: CRP: 2.3 mg/dL — ABNORMAL HIGH (ref <1.0)

## 2019-09-23 LAB — ABO/RH: ABO/RH(D): O POS

## 2019-09-23 LAB — GLUCOSE, CAPILLARY: Glucose-Capillary: 127 mg/dL — ABNORMAL HIGH (ref 70–99)

## 2019-09-23 LAB — FERRITIN: Ferritin: 229 ng/mL (ref 11–307)

## 2019-09-23 LAB — D-DIMER, QUANTITATIVE: D-Dimer, Quant: 7.99 ug/mL-FEU — ABNORMAL HIGH (ref 0.00–0.50)

## 2019-09-23 MED ORDER — SODIUM CHLORIDE 0.9% FLUSH
10.0000 mL | Freq: Two times a day (BID) | INTRAVENOUS | Status: DC
  Administered 2019-09-23 – 2019-09-27 (×8): 10 mL
  Administered 2019-09-27: 20 mL
  Administered 2019-09-28: 10 mL

## 2019-09-23 MED ORDER — SODIUM CHLORIDE 0.9% FLUSH: 10.0000 mL | INTRAVENOUS | Status: DC | PRN

## 2019-09-23 MED ORDER — SODIUM ZIRCONIUM CYCLOSILICATE 5 G PO PACK
5.0000 g | PACK | Freq: Once | ORAL | Status: AC
  Administered 2019-09-23: 5 g via ORAL
  Filled 2019-09-23: qty 1

## 2019-09-23 NOTE — Progress Notes (Addendum)
PROGRESS NOTE  Krystal Mckenzie [id number] DOB: [birth year] DOA: 09/20/2019 PCP: Patient, No Pcp Per   LOS: 3 days   Brief Narrative / Interim history: 86 year old female with chronic diastolic CHF, HTN, hypothyroidism, CKD 4, came from [facility] facility for malaise, fatigue, hypoxia in the setting of recent COVID-19 infection.  She was diagnosed with Covid roughly couple of weeks ago per SNF, initially requiring 2 to 3 L and she was also given steroids.  She was also receiving antibiotics for recurrent UTIs initially nitrofurantoin which was later changed to [place] in the last 3 days prior to admission.  She reports chronic edema on her legs which has recently resolved however she has been having chronic tenderness.  She also had some GI symptoms with diarrhea which is now resolved.  Her suprapubic pain and dysuria have improved on antibiotics.  Chest x-ray on admission showed bibasilar opacities.  She tested positive for COVID-19  Subjective / 24h Interval events: Complains of dysuria and lower suprapubic pain.  Also complains of shortness of breath with activity but feeling okay at rest.  No chest pain, no abdominal pain, no nausea or vomiting.  Assessment & Plan:  Principal Problem Acute Hypoxic Respiratory Failure due to Covid-19 Viral Illness -Patient was on 3 L on admission however currently on 10 L.  Chest x-ray showed bibasilar opacities left greater than right worrisome for pneumonia in the setting of COVID-19 -Started on remdesivir, today's day #3, continue IV steroids -She received convalescent plasma per Dr. Tana Coast -Given elevated D-dimer and worsening hypoxia she was started on therapeutic anticoagulation with Lovenox, due to chronic kidney disease we are unable to do CT angiogram and she is unable to lay flat for the VQ scan.  Lower extremity Dopplers negative for DVT -Inflammatory markers improving, Actemra has been held however daughter has consented to it if  needed  COVID-19 Labs  Recent Labs    09/20/19 2121 09/22/19 1527 09/23/19 0400  DDIMER 8.95* 11.48* 7.99*  FERRITIN 189 225 229  LDH 168  --   --   CRP 4.7* 1.5* 2.3*    Lab Results  Component Value Date   SARSCOV2NAA POSITIVE (A) 09/20/2019   Active Problems History of recurrent UTIs -Recently placed on nitrofurantoin and switch to Bactrim, during this hospital stay she was placed on meropenem, today is day #4, she still has symptoms and will continue  Acute kidney injury on chronic kidney disease stage IV  -Not clear baseline creatinine but in 2012 it was 1.9.  Her creatinine during this admission went as high as 4.5, currently 2.5 which may be actually close to her baseline.  Avoid nephrotoxins, continue to monitor renal function  Hyperkalemia -Potassium 5.6 this morning, I will give Lokelma x1  Hypothyroidism -Continue Synthroid  Hypotension with history of hypertension -BP now stable.  Antihypertensives on hold.  Chronic diastolic CHF -Currently holding diuretics due to borderline BP, poor renal function -Follow volume status closely   Scheduled Meds:  acidophilus  2 capsule Oral BID WC   aspirin EC  81 mg Oral QPM   Chlorhexidine Gluconate Cloth  6 each Topical Daily   enoxaparin (LOVENOX) injection  1 mg/kg Subcutaneous Q24H   febuxostat  80 mg Oral Daily   levothyroxine  75 mcg Oral Q0600   LORazepam  0.5 mg Oral Daily   mouth rinse  15 mL Mouth Rinse BID   methylPREDNISolone (SOLU-MEDROL) injection  60 mg Intravenous Q12H   pantoprazole  40 mg Oral Daily  sodium chloride flush  3 mL Intravenous Q12H   traZODone  50 mg Oral QHS   Continuous Infusions:  sodium chloride Stopped (09/22/19 1719)   remdesivir 100 mg in NS 100 mL Stopped (09/22/19 0942)   PRN Meds:.sodium chloride, acetaminophen **OR** acetaminophen, HYDROcodone-acetaminophen, ondansetron **OR** ondansetron (ZOFRAN) IV  DVT prophylaxis: Therapeutic Lovenox Code Status:  DNR Family Communication: Called daughter Eben Burow [phone]  Disposition Plan: Likely home when ready  Consultants:  None  Procedures:  None   Microbiology: Urine cultures without significant growth  Antimicrobials: Meropenem 1/17 >> today day #4  Objective: Vitals:   09/22/19 2317 09/23/19 0400 09/23/19 0804 09/23/19 1147  BP: (!) 133/59  (!) 125/51 (!) 134/54  Pulse: 69 (!) 55 (!) 58 75  Resp: 19 19 (!) 21 (!) 21  Temp: 97.9 F (36.6 C) 98 F (36.7 C) 98.2 F (36.8 C) 98 F (36.7 C)  TempSrc: Oral Oral Oral Oral  SpO2: 96% 96% 97% 97%  Weight:      Height:        Intake/Output Summary (Last 24 hours) at 09/23/2019 1345 Last data filed at 09/23/2019 0500 Gross per 24 hour  Intake 712.65 ml  Output 300 ml  Net 412.65 ml   Filed Weights   09/21/19 0549 09/21/19 1700 09/22/19 0500  Weight: 68 kg 72.4 kg 75.2 kg    Examination:  Constitutional: NAD Eyes: no scleral icterus ENMT: Mucous membranes are moist.  Neck: normal, supple Respiratory: clear to auscultation bilaterally, no wheezing, no crackles. Normal respiratory effort. No accessory muscle use.  Cardiovascular: Regular rate and rhythm, no murmurs / rubs / gallops. No LE edema.  Abdomen: non distended, no tenderness. Bowel sounds positive.  Musculoskeletal: no clubbing / cyanosis.  Skin: no rashes Neurologic: CN 2-12 grossly intact. Strength 5/5 in all 4.    Data Reviewed: I have independently reviewed following labs and imaging studies   CBC: Recent Labs  Lab 09/20/19 2121 09/20/19 2144 09/21/19 0543 09/23/19 0400  WBC 10.6*  --  7.8 8.3  NEUTROABS 9.9*  --  6.9  --   HGB 11.3* 11.6* 11.0* 10.9*  HCT 35.6* 34.0* 35.8* 34.1*  MCV 96.0  --  97.3 96.3  PLT 304  --  301 [id number]   Basic Metabolic Panel: Recent Labs  Lab 09/20/19 2121 09/20/19 2121 09/20/19 2144 09/21/19 1323 09/21/19 1911 09/22/19 1422 09/23/19 0400  NA 133*  --  134* 136  --  138 134*  K 5.9*   < > 5.9* 5.1 4.6  4.9 5.6*  CL 103  --  105 107  --  109 106  CO2 21*  --   --  21*  --  23 21*  GLUCOSE 131*  --  126* 117*  --  111* 138*  BUN 87*  --  86* 78*  --  74* 74*  CREATININE 4.12*  --  4.50* 3.44*  --  2.93* 2.50*  CALCIUM 8.8*  --   --  8.3*  --  8.5* 8.3*   < > = values in this interval not displayed.   GFR: Estimated Creatinine Clearance: 14.2 mL/min (A) (by C-G formula based on SCr of 2.5 mg/dL (H)). Liver Function Tests: Recent Labs  Lab 09/20/19 2121 09/22/19 1527 09/23/19 0400  AST 20 17 17   ALT 14 12 10   ALKPHOS 72 55 53  BILITOT 0.4 0.6 0.5  PROT 6.6 5.7* 5.7*  ALBUMIN 3.0* 2.4* 2.4*   No results for input(s): LIPASE, AMYLASE in  the last 168 hours. No results for input(s): AMMONIA in the last 168 hours. Coagulation Profile: No results for input(s): INR, PROTIME in the last 168 hours. Cardiac Enzymes: No results for input(s): CKTOTAL, CKMB, CKMBINDEX, TROPONINI in the last 168 hours. BNP (last 3 results) No results for input(s): PROBNP in the last 8760 hours. HbA1C: No results for input(s): HGBA1C in the last 72 hours. CBG: Recent Labs  Lab 09/21/19 1650 09/23/19 0729  GLUCAP 106* 127*   Lipid Profile: Recent Labs    09/20/19 2121  TRIG 126   Thyroid Function Tests: No results for input(s): TSH, T4TOTAL, FREET4, T3FREE, THYROIDAB in the last 72 hours. Anemia Panel: Recent Labs    09/22/19 1527 09/23/19 0400  FERRITIN 225 229   Urine analysis:    Component Value Date/Time   COLORURINE AMBER (A) 09/20/2019 2100   APPEARANCEUR HAZY (A) 09/20/2019 2100   LABSPEC 1.013 09/20/2019 2100   PHURINE 5.0 09/20/2019 2100   GLUCOSEU NEGATIVE 09/20/2019 2100   HGBUR NEGATIVE 09/20/2019 2100   [place] NEGATIVE 09/20/2019 2100   [place] NEGATIVE 09/20/2019 2100   PROTEINUR NEGATIVE 09/20/2019 2100   UROBILINOGEN 0.2 05/09/2011 1918   NITRITE NEGATIVE 09/20/2019 2100   LEUKOCYTESUR LARGE (A) 09/20/2019 2100   Sepsis Labs: Invalid input(s):  PROCALCITONIN, LACTICIDVEN  Recent Results (from the past 240 hour(s))  Blood Culture (routine x 2)     Status: None (Preliminary result)   Collection Time: 09/20/19  8:30 PM   Specimen: BLOOD  Result Value Ref Range Status   Specimen Description   Final    BLOOD RIGHT ANTECUBITAL Performed at [facility] [address] [place], [place] [address]    Special Requests   Final    BOTTLES DRAWN AEROBIC AND ANAEROBIC Blood Culture results may not be optimal due to an excessive volume of blood received in culture bottles Performed at [facility] [address] [place], [place] [address]    Culture   Final    NO GROWTH 2 DAYS Performed at [facility] [address], [place], [place] [address]    Report Status PENDING  Incomplete  Urine culture     Status: Abnormal   Collection Time: 09/20/19  9:00 PM   Specimen: Urine, Clean Catch  Result Value Ref Range Status   Specimen Description   Final    URINE, CLEAN CATCH Performed at [facility], [place] [address] [place], [place] [address]    Special Requests   Final    NONE Performed at [facility], [place] [address], [place], [place] [address]    Culture (A)  Final    <10,000 COLONIES/mL INSIGNIFICANT GROWTH Performed at [facility] [address] [place], [place] [address]    Report Status 09/21/2019 FINAL  Final  SARS CORONAVIRUS 2 (TAT 6-24 HRS) Nasopharyngeal Nasopharyngeal Swab     Status: Abnormal   Collection Time: 09/20/19 10:44 PM   Specimen: Nasopharyngeal Swab  Result Value Ref Range Status   SARS Coronavirus 2 POSITIVE (A) NEGATIVE Final    Comment: RESULT CALLED TO, READ BACK BY AND VERIFIED WITH: H. COOK,RN 0630 09/21/2019 T. TYSOR (NOTE) SARS-CoV-2 target nucleic acids are DETECTED. The SARS-CoV-2 RNA is generally detectable in upper and lower respiratory specimens during the acute phase of infection. Positive results are  indicative of the presence of SARS-CoV-2 RNA. Clinical correlation with patient history and other diagnostic information is  necessary to determine patient infection  status. Positive results do not rule out bacterial infection or co-infection with other viruses.  The expected result is Negative. Fact Sheet for Patients: SugarRoll.be Fact Sheet for Healthcare Providers: https://www.woods-mathews.com/ This test is not yet approved or cleared by the [place] FDA and  has been authorized for detection and/or diagnosis of SARS-CoV-2 by FDA under an Emergency Use Authorization (EUA). This EUA will remain  in effect (meaning this test can be used) for the  duration of the COVID-19 declaration under Section 564(b)(1) of the Act, 21 U.S.C. section 360bbb-3(b)(1), unless the authorization is terminated or revoked sooner. Performed at [facility], [place] [address] [place], [place] [address]   MRSA PCR Screening     Status: None   Collection Time: 09/21/19  5:43 AM   Specimen: Nasal Mucosa; Nasopharyngeal  Result Value Ref Range Status   MRSA by PCR NEGATIVE NEGATIVE Final    Comment:        The GeneXpert MRSA Assay (FDA approved for NASAL specimens only), is one component of a comprehensive MRSA colonization surveillance program. It is not intended to diagnose MRSA infection nor to guide or monitor treatment for MRSA infections. Performed at [facility], [place] [address], [place], [place] [address]   MRSA PCR Screening     Status: None   Collection Time: 09/21/19  4:47 PM   Specimen: Nasal Mucosa; Nasopharyngeal  Result Value Ref Range Status   MRSA by PCR NEGATIVE NEGATIVE Final    Comment:        The GeneXpert MRSA Assay (FDA approved for NASAL specimens only), is one component of a comprehensive MRSA colonization surveillance program. It is not intended to diagnose MRSA infection nor to guide or monitor  treatment for MRSA infections. Performed at [facility], [place] [address], [place], [place] [address]   Blood Culture (routine x 2)     Status: None (Preliminary result)   Collection Time: 09/21/19  7:11 PM   Specimen: BLOOD LEFT HAND  Result Value Ref Range Status   Specimen Description   Final    BLOOD LEFT HAND Performed at [facility] [address], [place], [place] [address]    Special Requests   Final    BOTTLES DRAWN AEROBIC AND ANAEROBIC Blood Culture adequate volume Performed at [facility] [address], [place], [place] [address]    Culture   Final    NO GROWTH 2 DAYS Performed at [facility] [address] [place], [place] [address]    Report Status PENDING  Incomplete      Radiology Studies: US RENAL  Result Date: 09/22/2019 CLINICAL DATA:  Acute renal failure EXAM: RENAL / URINARY TRACT ULTRASOUND COMPLETE COMPARISON:  CT 05/10/2011 FINDINGS: Right Kidney: Renal measurements: 8.2 x 3.8 x 3.9 cm = volume: 64.3 mL. Echogenic cortex. No mass or hydronephrosis Left Kidney: Renal measurements: 7.8 x 3.3 x 3.4 cm = volume: 45.3 mL. Echogenic cortex. No mass or hydronephrosis. Bladder: Appears normal for degree of bladder distention. Other: None. IMPRESSION: Small echogenic kidneys consistent with medical renal disease and mild atrophy. No hydronephrosis. Electronically Signed   By: Donavan Foil M.D.   On: 09/22/2019 19:19   VAS [place] LOWER EXTREMITY VENOUS (DVT)  Result Date: 09/22/2019  Lower Venous Study Indications: Pain.  Risk Factors: COVID 19 positive. Limitations: Poor ultrasound/tissue interface and patient pain tolerance, patient immobility, patient positioning. Comparison Study: No prior studies. Performing Technologist: Oliver Hum RVT  Examination Guidelines: A complete evaluation includes B-mode imaging, spectral Doppler, color Doppler, and power Doppler as needed of all accessible portions of  each vessel. Bilateral testing is considered an integral part of a complete examination. Limited examinations for reoccurring indications may be performed as noted.  +---------+---------------+---------+-----------+----------+--------------+  RIGHT     Compressibility Phasicity Spontaneity Properties Thrombus Aging  +---------+---------------+---------+-----------+----------+--------------+  CFV       Full            Yes       Yes                                    +---------+---------------+---------+-----------+----------+--------------+  FV Prox   Full                                                             +---------+---------------+---------+-----------+----------+--------------+  FV Mid                    Yes       Yes                                    +---------+---------------+---------+-----------+----------+--------------+  FV Distal                 Yes       Yes                                    +---------+---------------+---------+-----------+----------+--------------+  POP       Full            Yes       Yes                                    +---------+---------------+---------+-----------+----------+--------------+  PTV       Full                                                             +---------+---------------+---------+-----------+----------+--------------+  PERO                                                       Not visualized  +---------+---------------+---------+-----------+----------+--------------+   +---------+---------------+---------+-----------+----------+--------------+  LEFT      Compressibility Phasicity Spontaneity Properties Thrombus Aging  +---------+---------------+---------+-----------+----------+--------------+  CFV                       Yes       Yes                                    +---------+---------------+---------+-----------+----------+--------------+  FV Prox                   Yes       Yes                                     +---------+---------------+---------+-----------+----------+--------------+  FV Mid                    Yes       Yes                                    +---------+---------------+---------+-----------+----------+--------------+  FV Distal                                                  Not visualized  +---------+---------------+---------+-----------+----------+--------------+  POP                                                        Not visualized  +---------+---------------+---------+-----------+----------+--------------+  PTV                                                        Not visualized  +---------+---------------+---------+-----------+----------+--------------+  PERO                                                       Not visualized  +---------+---------------+---------+-----------+----------+--------------+     Summary: Right: There is no evidence of deep vein thrombosis in the lower extremity. However, portions of this examination were limited- see technologist comments above. No cystic structure found in the popliteal fossa. Left: There is no evidence of deep vein thrombosis in the lower extremity. However, portions of this examination were limited- see technologist comments above. No cystic structure found in the popliteal fossa.  *See table(s) above for measurements and observations. Electronically signed by Deitra Mayo MD on 09/22/2019 at 12:53:25 PM.    Final     Marzetta Board, MD, PhD [facility]  Contact via  [email or web address]  [place] P: [phone] F: [phone]

## 2019-09-24 ENCOUNTER — Inpatient Hospital Stay (HOSPITAL_COMMUNITY): Payer: Medicare Other

## 2019-09-24 LAB — COMPREHENSIVE METABOLIC PANEL
ALT: 12 U/L (ref 0–44)
AST: 16 U/L (ref 15–41)
Albumin: 2.4 g/dL — ABNORMAL LOW (ref 3.5–5.0)
Alkaline Phosphatase: 59 U/L (ref 38–126)
Anion gap: 9 (ref 5–15)
BUN: 70 mg/dL — ABNORMAL HIGH (ref 8–23)
CO2: 23 mmol/L (ref 22–32)
Calcium: 8.4 mg/dL — ABNORMAL LOW (ref 8.9–10.3)
Chloride: 103 mmol/L (ref 98–111)
Creatinine, Ser: 1.96 mg/dL — ABNORMAL HIGH (ref 0.44–1.00)
GFR calc Af Amer: 25 mL/min — ABNORMAL LOW (ref >60)
GFR calc non Af Amer: 22 mL/min — ABNORMAL LOW (ref >60)
Glucose, Bld: 163 mg/dL — ABNORMAL HIGH (ref 70–99)
Potassium: 5.1 mmol/L (ref 3.5–5.1)
Sodium: 135 mmol/L (ref 135–145)
Total Bilirubin: 0.5 mg/dL (ref 0.3–1.2)
Total Protein: 5.9 g/dL — ABNORMAL LOW (ref 6.5–8.1)

## 2019-09-24 LAB — CBC
HCT: 35.7 % — ABNORMAL LOW (ref 36.0–46.0)
Hemoglobin: 11.2 g/dL — ABNORMAL LOW (ref 12.0–15.0)
MCH: 30 pg (ref 26.0–34.0)
MCHC: 31.4 g/dL (ref 30.0–36.0)
MCV: 95.7 fL (ref 80.0–100.0)
Platelets: 311 10*3/uL (ref 150–400)
RBC: 3.73 MIL/uL — ABNORMAL LOW (ref 3.87–5.11)
RDW: 14.4 % (ref 11.5–15.5)
WBC: 9.1 10*3/uL (ref 4.0–10.5)
nRBC: 0 % (ref 0.0–0.2)

## 2019-09-24 LAB — FERRITIN: Ferritin: 187 ng/mL (ref 11–307)

## 2019-09-24 LAB — D-DIMER, QUANTITATIVE: D-Dimer, Quant: 8.82 ug/mL-FEU — ABNORMAL HIGH (ref 0.00–0.50)

## 2019-09-24 LAB — C-REACTIVE PROTEIN: CRP: 2.7 mg/dL — ABNORMAL HIGH (ref <1.0)

## 2019-09-24 MED ORDER — LORAZEPAM 0.5 MG PO TABS
0.5000 mg | ORAL_TABLET | Freq: Once | ORAL | Status: AC | PRN
  Administered 2019-09-24: 0.5 mg via ORAL
  Filled 2019-09-24: qty 1

## 2019-09-24 MED ORDER — LORAZEPAM 2 MG/ML IJ SOLN: 0.2500 mg | Freq: Once | INTRAMUSCULAR | Status: DC

## 2019-09-24 MED ORDER — TORSEMIDE 10 MG PO TABS
10.0000 mg | ORAL_TABLET | Freq: Every day | ORAL | Status: DC
  Administered 2019-09-24 – 2019-09-25 (×2): 10 mg via ORAL
  Filled 2019-09-24 (×2): qty 1

## 2019-09-24 NOTE — Progress Notes (Signed)
PROGRESS NOTE  Krystal Mckenzie [id number] DOB: 04/08/1927 DOA: 09/20/2019 PCP: Patient, No Pcp Per   LOS: 4 days   Brief Narrative / Interim history: 86 year old female with chronic diastolic CHF, HTN, hypothyroidism, CKD 4, came from [facility] facility for malaise, fatigue, hypoxia in the setting of recent COVID-19 infection.  She was diagnosed with Covid roughly couple of weeks ago per SNF, initially requiring 2 to 3 L and she was also given steroids.  She was also receiving antibiotics for recurrent UTIs initially nitrofurantoin which was later changed to Eddystone in the last 3 days prior to admission.  She reports chronic edema on her legs which has recently resolved however she has been having chronic tenderness.  She also had some GI symptoms with diarrhea which is now resolved.  Her suprapubic pain and dysuria have improved on antibiotics.  Chest x-ray on admission showed bibasilar opacities.  She tested positive for COVID-19  Subjective / 24h Interval events: Complains of pain all over.  Denies any significant shortness of breath, no chest pain.  Assessment & Plan:  Principal Problem Acute Hypoxic Respiratory Failure due to Covid-19 Viral Illness -Chest x-ray on admission showed bibasilar opacities worrisome for multifocal pneumonia in the setting of COVID-19 -Continue IV steroids, today day #4 -Continue remdesivir, today day #4 -Received convalescent plasma -Given elevated D-dimer and worsening hypoxia she was started on therapeutic anticoagulation with Lovenox.  Unable to do CT angiogram due to chronic kidney disease, lower extremity Dopplers negative for DVT -Inflammatory markers improving, clinically improving, currently on 6 L  COVID-19 Labs  Recent Labs    09/22/19 1527 09/23/19 0400 09/24/19 0500  DDIMER 11.48* 7.99* 8.82*  FERRITIN 225 229 187  CRP 1.5* 2.3* 2.7*    Lab Results  Component Value Date   SARSCOV2NAA POSITIVE (A) 09/20/2019   Active  Problems History of recurrent UTIs -Recently placed on nitrofurantoin and switch to Bactrim, during this hospital stay she was placed on meropenem, today is day #5, she still has symptoms and will continue  Acute kidney injury on chronic kidney disease stage IV  -Not clear baseline creatinine but in 2012 it was 1.9.  Her creatinine during this admission went as high as 4.5, currently improving at 1.9 which I believe is at her baseline  Hyperkalemia -Resolved, received Lokelma on 1/20  Hypothyroidism -Continue Synthroid  Hypotension with history of hypertension -BP now stable.  Antihypertensives on hold.  Chronic diastolic CHF -Currently holding diuretics due to borderline BP, poor renal function -Follow volume status closely  "Vaginal" pain/lower pelvic pain -This appears to be new, daughter tells me that she has been complaining of that for the past several weeks and that is the main reason why she came to the hospital -Obtain a CT scan of the pelvis without contrast to further delineate.  Given level of pain may not be able to tolerate a transvaginal pelvic ultrasound  Scheduled Meds: . acidophilus  2 capsule Oral BID WC  . aspirin EC  81 mg Oral QPM  . Chlorhexidine Gluconate Cloth  6 each Topical Daily  . enoxaparin (LOVENOX) injection  1 mg/kg Subcutaneous Q24H  . febuxostat  80 mg Oral Daily  . levothyroxine  75 mcg Oral Q0600  . LORazepam  0.5 mg Oral Daily  . mouth rinse  15 mL Mouth Rinse BID  . methylPREDNISolone (SOLU-MEDROL) injection  60 mg Intravenous Q12H  . pantoprazole  40 mg Oral Daily  . sodium chloride flush  10-40 mL Intracatheter Q12H  .  sodium chloride flush  3 mL Intravenous Q12H  . torsemide  10 mg Oral Daily  . traZODone  50 mg Oral QHS   Continuous Infusions: . sodium chloride Stopped (09/22/19 1719)  . remdesivir 100 mg in NS 100 mL 100 mg (09/24/19 1046)   PRN Meds:.sodium chloride, acetaminophen **OR** acetaminophen,  HYDROcodone-acetaminophen, ondansetron **OR** ondansetron (ZOFRAN) IV, sodium chloride flush  DVT prophylaxis: Therapeutic Lovenox Code Status: DNR Family Communication: Called daughter Eben Burow [phone]  Disposition Plan: Likely home when ready  Consultants:  None  Procedures:  None   Microbiology: Urine cultures without significant growth  Antimicrobials: Meropenem 1/17 >> today day #4  Objective: Vitals:   09/24/19 0437 09/24/19 0500 09/24/19 0746 09/24/19 0800  BP: (!) 134/54  (!) 137/57   Pulse: 74  81 83  Resp: (!) 25  20 20   Temp: 97.8 F (36.6 C)  98.3 F (36.8 C)   TempSrc: Oral  Oral   SpO2: 97%  99% 94%  Weight:  76.7 kg    Height:        Intake/Output Summary (Last 24 hours) at 09/24/2019 1348 Last data filed at 09/23/2019 1600 Gross per 24 hour  Intake --  Output 450 ml  Net -450 ml   Filed Weights   09/21/19 1700 09/22/19 0500 09/24/19 0500  Weight: 72.4 kg 75.2 kg 76.7 kg    Examination:  Constitutional: No distress Eyes: No scleral icterus ENMT: Moist mucous membranes Neck: normal, supple Respiratory: Clear bilaterally without wheezing or crackles, diminished at the bases Cardiovascular: Regular rate and rhythm, no murmurs, no edema Abdomen: Soft, ND, NT, BS positive Musculoskeletal: no clubbing / cyanosis.  Skin: No rashes Neurologic: Nonfocal   Data Reviewed: I have independently reviewed following labs and imaging studies   CBC: Recent Labs  Lab 09/20/19 2121 09/20/19 2144 09/21/19 0543 09/23/19 0400 09/24/19 0500  WBC 10.6*  --  7.8 8.3 9.1  NEUTROABS 9.9*  --  6.9  --   --   HGB 11.3* 11.6* 11.0* 10.9* 11.2*  HCT 35.6* 34.0* 35.8* 34.1* 35.7*  MCV 96.0  --  97.3 96.3 95.7  PLT 304  --  301 275 [id number]   Basic Metabolic Panel: Recent Labs  Lab 09/20/19 2121 09/20/19 2121 09/20/19 2144 09/20/19 2144 09/21/19 1323 09/21/19 1911 09/22/19 1422 09/23/19 0400 09/24/19 0500  NA 133*   < > 134*  --  136  --  138  134* 135  K 5.9*   < > 5.9*   < > 5.1 4.6 4.9 5.6* 5.1  CL 103   < > 105  --  107  --  109 106 103  CO2 21*  --   --   --  21*  --  23 21* 23  GLUCOSE 131*   < > 126*  --  117*  --  111* 138* 163*  BUN 87*   < > 86*  --  78*  --  74* 74* 70*  CREATININE 4.12*   < > 4.50*  --  3.44*  --  2.93* 2.50* 1.96*  CALCIUM 8.8*  --   --   --  8.3*  --  8.5* 8.3* 8.4*   < > = values in this interval not displayed.   GFR: Estimated Creatinine Clearance: 18.3 mL/min (A) (by C-G formula based on SCr of 1.96 mg/dL (H)). Liver Function Tests: Recent Labs  Lab 09/20/19 2121 09/22/19 1527 09/23/19 0400 09/24/19 0500  AST 20 17 17 16   ALT  14 12 10 12   ALKPHOS 72 55 53 59  BILITOT 0.4 0.6 0.5 0.5  PROT 6.6 5.7* 5.7* 5.9*  ALBUMIN 3.0* 2.4* 2.4* 2.4*   No results for input(s): LIPASE, AMYLASE in the last 168 hours. No results for input(s): AMMONIA in the last 168 hours. Coagulation Profile: No results for input(s): INR, PROTIME in the last 168 hours. Cardiac Enzymes: No results for input(s): CKTOTAL, CKMB, CKMBINDEX, TROPONINI in the last 168 hours. BNP (last 3 results) No results for input(s): PROBNP in the last 8760 hours. HbA1C: No results for input(s): HGBA1C in the last 72 hours. CBG: Recent Labs  Lab 09/21/19 1650 09/23/19 0729  GLUCAP 106* 127*   Lipid Profile: No results for input(s): CHOL, HDL, LDLCALC, TRIG, CHOLHDL, LDLDIRECT in the last 72 hours. Thyroid Function Tests: No results for input(s): TSH, T4TOTAL, FREET4, T3FREE, THYROIDAB in the last 72 hours. Anemia Panel: Recent Labs    09/23/19 0400 09/24/19 0500  FERRITIN 229 187   Urine analysis:    Component Value Date/Time   COLORURINE AMBER (A) 09/20/2019 2100   APPEARANCEUR HAZY (A) 09/20/2019 2100   LABSPEC 1.013 09/20/2019 2100   PHURINE 5.0 09/20/2019 2100   GLUCOSEU NEGATIVE 09/20/2019 2100   HGBUR NEGATIVE 09/20/2019 2100   [place] NEGATIVE 09/20/2019 2100   [place] NEGATIVE 09/20/2019 2100    PROTEINUR NEGATIVE 09/20/2019 2100   UROBILINOGEN 0.2 05/09/2011 1918   NITRITE NEGATIVE 09/20/2019 2100   LEUKOCYTESUR LARGE (A) 09/20/2019 2100   Sepsis Labs: Invalid input(s): PROCALCITONIN, LACTICIDVEN  Recent Results (from the past 240 hour(s))  Blood Culture (routine x 2)     Status: None (Preliminary result)   Collection Time: 09/20/19  8:30 PM   Specimen: BLOOD  Result Value Ref Range Status   Specimen Description   Final    BLOOD RIGHT ANTECUBITAL Performed at [facility] [address] [place], [place] [address]    Special Requests   Final    BOTTLES DRAWN AEROBIC AND ANAEROBIC Blood Culture results may not be optimal due to an excessive volume of blood received in culture bottles Performed at [facility] [address], [place], [place] [address]    Culture   Final    NO GROWTH 3 DAYS Performed at [facility], [place] [address] [place], [place] [address]    Report Status PENDING  Incomplete  Urine culture     Status: Abnormal   Collection Time: 09/20/19  9:00 PM   Specimen: Urine, Clean Catch  Result Value Ref Range Status   Specimen Description   Final    URINE, CLEAN CATCH Performed at [facility], [place] [address], [place], [place] [address]    Special Requests   Final    NONE Performed at [facility], [place] [address] [place], [place] [address]    Culture (A)  Final    <10,000 COLONIES/mL INSIGNIFICANT GROWTH Performed at [facility] [address], [place], [place] [address]    Report Status 09/21/2019 FINAL  Final  SARS CORONAVIRUS 2 (TAT 6-24 HRS) Nasopharyngeal Nasopharyngeal Swab     Status: Abnormal   Collection Time: 09/20/19 10:44 PM   Specimen: Nasopharyngeal Swab  Result Value Ref Range Status   SARS Coronavirus 2 POSITIVE (A) NEGATIVE Final    Comment: RESULT CALLED TO, READ BACK BY AND VERIFIED WITH: H. COOK,RN 0630 09/21/2019 T.  TYSOR (NOTE) SARS-CoV-2 target nucleic acids are DETECTED. The SARS-CoV-2 RNA is  generally detectable in upper and lower respiratory specimens during the acute phase of infection. Positive results are indicative of the presence of SARS-CoV-2 RNA. Clinical correlation with patient history and other diagnostic information is  necessary to determine patient infection status. Positive results do not rule out bacterial infection or co-infection with other viruses.  The expected result is Negative. Fact Sheet for Patients: SugarRoll.be Fact Sheet for Healthcare Providers: https://www.woods-mathews.com/ This test is not yet approved or cleared by the [place] FDA and  has been authorized for detection and/or diagnosis of SARS-CoV-2 by FDA under an Emergency Use Authorization (EUA). This EUA will remain  in effect (meaning this test can be used) for the  duration of the COVID-19 declaration under Section 564(b)(1) of the Act, 21 U.S.C. section 360bbb-3(b)(1), unless the authorization is terminated or revoked sooner. Performed at [facility], [place] [address] [place], [place] [address]   MRSA PCR Screening     Status: None   Collection Time: 09/21/19  5:43 AM   Specimen: Nasal Mucosa; Nasopharyngeal  Result Value Ref Range Status   MRSA by PCR NEGATIVE NEGATIVE Final    Comment:        The GeneXpert MRSA Assay (FDA approved for NASAL specimens only), is one component of a comprehensive MRSA colonization surveillance program. It is not intended to diagnose MRSA infection nor to guide or monitor treatment for MRSA infections. Performed at [facility], [place] [address], [place], [place] [address]   MRSA PCR Screening     Status: None   Collection Time: 09/21/19  4:47 PM   Specimen: Nasal Mucosa; Nasopharyngeal  Result Value Ref Range Status   MRSA by PCR NEGATIVE NEGATIVE Final    Comment:        The GeneXpert  MRSA Assay (FDA approved for NASAL specimens only), is one component of a comprehensive MRSA colonization surveillance program. It is not intended to diagnose MRSA infection nor to guide or monitor treatment for MRSA infections. Performed at [facility], [place] [address], [place], [place] [address]   Blood Culture (routine x 2)     Status: None (Preliminary result)   Collection Time: 09/21/19  7:11 PM   Specimen: BLOOD LEFT HAND  Result Value Ref Range Status   Specimen Description   Final    BLOOD LEFT HAND Performed at [facility] [address] [place], [place] [address]    Special Requests   Final    BOTTLES DRAWN AEROBIC AND ANAEROBIC Blood Culture adequate volume Performed at [facility] [address] [place], [place] [address]    Culture   Final    NO GROWTH 3 DAYS Performed at [facility], [place] [address] [place], [place] [address]    Report Status PENDING  Incomplete      Radiology Studies: US RENAL  Result Date: 09/22/2019 CLINICAL DATA:  Acute renal failure EXAM: RENAL / URINARY TRACT ULTRASOUND COMPLETE COMPARISON:  CT 05/10/2011 FINDINGS: Right Kidney: Renal measurements: 8.2 x 3.8 x 3.9 cm = volume: 64.3 mL. Echogenic cortex. No mass or hydronephrosis Left Kidney: Renal measurements: 7.8 x 3.3 x 3.4 cm = volume: 45.3 mL. Echogenic cortex. No mass or hydronephrosis. Bladder: Appears normal for degree of bladder distention. Other: None. IMPRESSION: Small echogenic kidneys consistent with medical renal disease and mild atrophy. No hydronephrosis. Electronically Signed   By: Donavan Foil M.D.   On: 09/22/2019 19:19    Marzetta Board, MD,  PhD [facility]  Contact via  [email or web address]  [place] P: [phone] F: [phone]

## 2019-09-24 NOTE — Progress Notes (Signed)
   09/24/19 0857  Family/Significant Other Communication  Family/Significant Other Update Called;Updated (daughter, Manuela Schwartz)

## 2019-09-25 LAB — COMPREHENSIVE METABOLIC PANEL
ALT: 35 U/L (ref 0–44)
AST: 52 U/L — ABNORMAL HIGH (ref 15–41)
Albumin: 2.4 g/dL — ABNORMAL LOW (ref 3.5–5.0)
Alkaline Phosphatase: 71 U/L (ref 38–126)
Anion gap: 10 (ref 5–15)
BUN: 83 mg/dL — ABNORMAL HIGH (ref 8–23)
CO2: 23 mmol/L (ref 22–32)
Calcium: 8.4 mg/dL — ABNORMAL LOW (ref 8.9–10.3)
Chloride: 105 mmol/L (ref 98–111)
Creatinine, Ser: 2.02 mg/dL — ABNORMAL HIGH (ref 0.44–1.00)
GFR calc Af Amer: 24 mL/min — ABNORMAL LOW (ref >60)
GFR calc non Af Amer: 21 mL/min — ABNORMAL LOW (ref >60)
Glucose, Bld: 141 mg/dL — ABNORMAL HIGH (ref 70–99)
Potassium: 5.2 mmol/L — ABNORMAL HIGH (ref 3.5–5.1)
Sodium: 138 mmol/L (ref 135–145)
Total Bilirubin: 0.7 mg/dL (ref 0.3–1.2)
Total Protein: 5.7 g/dL — ABNORMAL LOW (ref 6.5–8.1)

## 2019-09-25 LAB — CBC
HCT: 36.9 % (ref 36.0–46.0)
Hemoglobin: 11.9 g/dL — ABNORMAL LOW (ref 12.0–15.0)
MCH: 30.6 pg (ref 26.0–34.0)
MCHC: 32.2 g/dL (ref 30.0–36.0)
MCV: 94.9 fL (ref 80.0–100.0)
Platelets: 312 10*3/uL (ref 150–400)
RBC: 3.89 MIL/uL (ref 3.87–5.11)
RDW: 14.4 % (ref 11.5–15.5)
WBC: 11.6 10*3/uL — ABNORMAL HIGH (ref 4.0–10.5)
nRBC: 0 % (ref 0.0–0.2)

## 2019-09-25 MED ORDER — FOSFOMYCIN TROMETHAMINE 3 G PO PACK
3.0000 g | PACK | Freq: Once | ORAL | Status: AC
  Administered 2019-09-25: 3 g via ORAL
  Filled 2019-09-25: qty 3

## 2019-09-25 MED ORDER — SODIUM ZIRCONIUM CYCLOSILICATE 5 G PO PACK
5.0000 g | PACK | Freq: Once | ORAL | Status: AC
  Administered 2019-09-25: 5 g via ORAL
  Filled 2019-09-25: qty 1

## 2019-09-25 NOTE — TOC Initial Note (Addendum)
Transition of Care [facility]) - Initial/Assessment Note    Patient Details  Name: Krystal Mckenzie MRN: [id number] Date of Birth: 1926-10-22  Transition of Care [facility]) CM/SW Contact:    Shade Flood, LCSW Phone Number: 09/25/2019, 3:37 PM  Clinical Narrative:                  Pt admitted from [place]. PT recommending SNF at dc. [place] has SNF rehab and pt's daughter has requested referral there. Spoke with admissions at [facility] and they can accept pt. They cannot take her over the weekend but can accept Monday. Admissions SW is Destiny and phone is [phone].  Faxed referral to [facility] at [phone]  Weekend TOC will be available to assist if needed.  Expected Discharge Plan: Skilled Nursing Facility Barriers to Discharge: Continued Medical Work up   Patient Goals and CMS Choice   CMS Medicare.gov Compare Post Acute Care list provided to:: Patient Represenative (must comment) Choice offered to / list presented to : Adult Children  Expected Discharge Plan and Services Expected Discharge Plan: [place] In-house Referral: Clinical Social Work   Post Acute Care Choice: [place] Living arrangements for the past 2 months: [place]                                      Prior Living Arrangements/Services Living arrangements for the past 2 months: [place] Lives with:: Facility Resident Patient language and need for interpreter reviewed:: Yes Do you feel safe going back to the place where you live?: Yes      Need for Family Participation in Patient Care: No (Comment) Care giver support system in place?: Yes (comment)   Criminal Activity/Legal Involvement Pertinent to Current Situation/Hospitalization: No - Comment as needed  Activities of Daily Living Home Assistive Devices/Equipment: Grab bars around toilet, Grab bars in shower, Hand-held shower hose, Wheelchair, Blood pressure  cuff, Scales([place] has necessary equipment for thier residents) ADL Screening (condition at time of admission) Patient's cognitive ability adequate to safely complete daily activities?: Yes Is the patient deaf or have difficulty hearing?: Yes Does the patient have difficulty seeing, even when wearing glasses/contacts?: No Does the patient have difficulty concentrating, remembering, or making decisions?: Yes Patient able to express need for assistance with ADLs?: Yes Does the patient have difficulty dressing or bathing?: Yes Independently performs ADLs?: No Communication: Independent Dressing (OT): Needs assistance Is this a change from baseline?: Change from baseline, expected to last >3 days Grooming: Needs assistance Is this a change from baseline?: Change from baseline, expected to last >3 days Feeding: Needs assistance Is this a change from baseline?: Change from baseline, expected to last >3 days Bathing: Needs assistance Is this a change from baseline?: Change from baseline, expected to last >3 days Toileting: Dependent Is this a change from baseline?: Change from baseline, expected to last >3days In/Out Bed: Dependent Is this a change from baseline?: Change from baseline, expected to last >3 days Walks in Home: Dependent Is this a change from baseline?: Change from baseline, expected to last >3 days Does the patient have difficulty walking or climbing stairs?: Yes(secondary to weakness) Weakness of Legs: Both Weakness of Arms/Hands: Both  Permission Sought/Granted                  Emotional Assessment       Orientation: : Oriented to  Self, Oriented to Place Alcohol / Substance Use: Not Applicable Psych Involvement: No (comment)  Admission diagnosis:  Hyperkalemia [E87.5] AKI (acute kidney injury) ([place]) [N17.9] Acute respiratory failure due to COVID-19 ([place]) [U07.1, J96.00] Patient Active Problem List   Diagnosis Date Noted  . Acute respiratory failure  due to COVID-19 ([place]) 09/21/2019  . Hyperkalemia 09/20/2019  . Acute renal failure superimposed on chronic kidney disease ([place]) 09/20/2019  . Pneumonia 09/20/2019  . Recurrent UTI 09/20/2019  . Hypothyroid   . Acute respiratory failure with hypoxia ([place])   . Hypertension   . Chronic diastolic CHF (congestive heart failure) ([place])   . Positive D dimer    PCP:  Patient, No Pcp Per Pharmacy:   [id number] - [place], [place] - [address] AT [place] [address] [place] [address] Phone: [phone] Fax: [phone]     Social Determinants of Health (SDOH) Interventions    Readmission Risk Interventions Readmission Risk Prevention Plan 09/25/2019  Transportation Screening Complete  HRI or [place] Not Complete  HRI or Home Care Consult comments Going to SNF  Social Work Consult for [place] Planning/Counseling Complete  Palliative Care Screening Not Applicable  Medication Review Press photographer) Complete  Some recent data might be hidden

## 2019-09-25 NOTE — Progress Notes (Signed)
This RN attempting to roll and reposition pt, she states "I hurt too much". Pt able to have APAP, given as per orders, pt has kerofoam on bil heels, buttocks/back. Will re-attempt to reposition pt in 58min or so (after pain med has a chance to work). This discussed with pt, teaching done r/t skin breakdown, infection and need for repositioning regularly, pt verbalizes understanding but "does not like to have my legs and back touched, they hurt". Will continue to monitor and pass this onto next shift.

## 2019-09-25 NOTE — NC FL2 (Signed)
[place] MEDICAID FL2 LEVEL OF CARE SCREENING TOOL     IDENTIFICATION  Patient Name: Krystal Mckenzie Birthdate: 08-May-1927 Sex: female Admission Date (Current Location): 09/20/2019  [facility] and [place] Number:  Herbalist and Address:  [facility], [place] [address] [place], [place] [address]      Provider Number: [id number]  Attending Physician Name and Address:  Caren Griffins, MD  Relative Name and Phone Number:       Current Level of Care: Hospital Recommended Level of Care: [facility] Prior Approval Number:    Date Approved/Denied:   PASRR Number: [id number]  Discharge Plan: SNF    Current Diagnoses: Patient Active Problem List   Diagnosis Date Noted  . Acute respiratory failure due to COVID-19 ([place]) 09/21/2019  . Hyperkalemia 09/20/2019  . Acute renal failure superimposed on chronic kidney disease ([place]) 09/20/2019  . Pneumonia 09/20/2019  . Recurrent UTI 09/20/2019  . Hypothyroid   . Acute respiratory failure with hypoxia ([place])   . Hypertension   . Chronic diastolic CHF (congestive heart failure) ([place])   . Positive D dimer     Orientation RESPIRATION BLADDER Height & Weight     Self, Place  O2(see dc summary) Incontinent Weight: 169 lb 1 oz (76.7 kg)(wt questionable,unable to obtain correctly, see note) Height:  5\' 2"  (157.5 cm)  BEHAVIORAL SYMPTOMS/MOOD NEUROLOGICAL BOWEL NUTRITION STATUS      Continent Diet(see dc summary)  AMBULATORY STATUS COMMUNICATION OF NEEDS Skin   Extensive Assist Verbally Normal                       Personal Care Assistance Level of Assistance  Bathing, Feeding, Dressing Bathing Assistance: Limited assistance Feeding assistance: Independent Dressing Assistance: Limited assistance     Functional Limitations Info  Sight, Hearing, Speech Sight Info: Adequate Hearing Info: Adequate Speech Info: Adequate    SPECIAL CARE FACTORS FREQUENCY  PT (By licensed PT), OT  (By licensed OT)     PT Frequency: 5 times weekly OT Frequency: 3 times weekly            Contractures Contractures Info: Not present    Additional Factors Info  Code Status, Allergies Code Status Info: DNR Allergies Info: Morphine and related, Cephalexin, Penicillins           Current Medications (09/25/2019):  This is the current hospital active medication list Current Facility-Administered Medications  Medication Dose Route Frequency Provider Last Rate Last Admin  . 0.9 %  sodium chloride infusion   Intravenous PRN Rai, Ripudeep K, MD   Stopped at 09/22/19 1719  . acetaminophen (TYLENOL) tablet 650 mg  650 mg Oral Q6H PRN Opyd, Ilene Qua, MD   650 mg at 09/25/19 [id number]   Or  . acetaminophen (TYLENOL) suppository 650 mg  650 mg Rectal Q6H PRN Opyd, Ilene Qua, MD      . acidophilus (RISAQUAD) capsule 2 capsule  2 capsule Oral BID WC Opyd, Ilene Qua, MD   2 capsule at 09/25/19 0900  . aspirin EC tablet 81 mg  81 mg Oral QPM Opyd, Ilene Qua, MD   81 mg at 09/24/19 1727  . Chlorhexidine Gluconate Cloth 2 % PADS 6 each  6 each Topical Daily Rai, Ripudeep K, MD   6 each at 09/25/19 0909  . enoxaparin (LOVENOX) injection 75 mg  1 mg/kg Subcutaneous Q24H Leodis Sias T, RPH   75 mg at 09/25/19 1300  . febuxostat (ULORIC)  tablet 80 mg  80 mg Oral Daily Opyd, Ilene Qua, MD   80 mg at 09/25/19 0909  . HYDROcodone-acetaminophen (NORCO/VICODIN) 5-325 MG per tablet 1 tablet  1 tablet Oral Q8H PRN Opyd, Ilene Qua, MD   1 tablet at 09/25/19 0909  . levothyroxine (SYNTHROID) tablet 75 mcg  75 mcg Oral Q0600 Vianne Bulls, MD   75 mcg at 09/25/19 0512  . LORazepam (ATIVAN) tablet 0.5 mg  0.5 mg Oral Daily Opyd, Ilene Qua, MD   0.5 mg at 09/25/19 0909  . MEDLINE mouth rinse  15 mL Mouth Rinse BID Rai, Ripudeep K, MD   15 mL at 09/25/19 0909  . methylPREDNISolone sodium succinate (SOLU-MEDROL) 125 mg/2 mL injection 60 mg  60 mg Intravenous Q12H Rai, Ripudeep K, MD   60 mg at 09/25/19 0500  .  ondansetron (ZOFRAN) tablet 4 mg  4 mg Oral Q6H PRN Opyd, Ilene Qua, MD       Or  . ondansetron (ZOFRAN) injection 4 mg  4 mg Intravenous Q6H PRN Opyd, Ilene Qua, MD      . pantoprazole (PROTONIX) EC tablet 40 mg  40 mg Oral Daily Opyd, Ilene Qua, MD   40 mg at 09/25/19 0909  . sodium chloride flush (NS) 0.9 % injection 10-40 mL  10-40 mL Intracatheter Q12H Caren Griffins, MD   10 mL at 09/25/19 0910  . sodium chloride flush (NS) 0.9 % injection 10-40 mL  10-40 mL Intracatheter PRN Caren Griffins, MD      . sodium chloride flush (NS) 0.9 % injection 3 mL  3 mL Intravenous Q12H Opyd, Ilene Qua, MD   3 mL at 09/25/19 0910  . traZODone (DESYREL) tablet 50 mg  50 mg Oral QHS Opyd, Ilene Qua, MD   50 mg at 09/24/19 2302     Discharge Medications: Please see discharge summary for a list of discharge medications.  Relevant Imaging Results:  Relevant Lab Results:   Additional Information SSN: [id number] [place]

## 2019-09-25 NOTE — Progress Notes (Signed)
PROGRESS NOTE  Krystal Mckenzie [id number] DOB: [birth year] DOA: 09/20/2019 PCP: Patient, No Pcp Per   LOS: 5 days   Brief Narrative / Interim history: 86 year old female with chronic diastolic CHF, HTN, hypothyroidism, CKD 4, came from [facility] facility for malaise, fatigue, hypoxia in the setting of recent COVID-19 infection.  She was diagnosed with Covid roughly couple of weeks ago per SNF, initially requiring 2 to 3 L and she was also given steroids.  She was also receiving antibiotics for recurrent UTIs initially nitrofurantoin which was later changed to [place] in the last 3 days prior to admission.  She reports chronic edema on her legs which has recently resolved however she has been having chronic tenderness.  She also had some GI symptoms with diarrhea which is now resolved.  Her suprapubic pain and dysuria have improved on antibiotics.  Chest x-ray on admission showed bibasilar opacities.  She tested positive for COVID-19  Subjective / 24h Interval events: Continues to have pain all over, no shortness of breath, no chest pain.  Assessment & Plan:  Principal Problem Acute Hypoxic Respiratory Failure due to Covid-19 Viral Illness -Chest x-ray on admission showed bibasilar opacities worrisome for multifocal pneumonia in the setting of COVID-19 -Continue IV steroids, today day #5 -Continue remdesivir, today day #5 and last dose -Received convalescent plasma -Given elevated D-dimer and worsening hypoxia she was started on therapeutic anticoagulation with Lovenox.  Unable to do CT angiogram due to chronic kidney disease, lower extremity Dopplers negative for DVT -Inflammatory markers improving, CRP, D-dimer.  She is on 2 L nasal cannula.  PT has been ordered  COVID-19 Labs  Recent Labs    09/22/19 1527 09/23/19 0400 09/24/19 0500  DDIMER 11.48* 7.99* 8.82*  FERRITIN 225 229 187  CRP 1.5* 2.3* 2.7*    Lab Results  Component Value Date   SARSCOV2NAA POSITIVE (A)  09/20/2019   Active Problems History of recurrent UTIs -Recently placed on nitrofurantoin and switch to Bactrim, during this hospital stay received meropenem for about 5 days, she still has symptoms and will give fosfomycin x1  Acute kidney injury on chronic kidney disease stage IV  -Not clear baseline creatinine but in 2012 it was 1.9.  Her creatinine during this admission went as high as 4.5, currently stable around 1.9-2.0 which I believe is at her baseline  Hyperkalemia -Repeat Lokelma x1 today  Hypothyroidism -Continue Synthroid  Hypotension with history of hypertension -BP now stable.  Antihypertensives on hold.  Chronic diastolic CHF -Currently holding diuretics due to borderline BP, poor renal function -Follow volume status closely  "Vaginal" pain/lower pelvic pain -This appears to be new, daughter tells me that she has been complaining of that for the past several weeks and that is the main reason why she came to the hospital -CT scan of the abdomen and pelvis without acute findings.  In discussion with bedside RN she does have a rash in the area which is improving with local topical care.  Continue.  Scheduled Meds: . acidophilus  2 capsule Oral BID WC  . aspirin EC  81 mg Oral QPM  . Chlorhexidine Gluconate Cloth  6 each Topical Daily  . enoxaparin (LOVENOX) injection  1 mg/kg Subcutaneous Q24H  . febuxostat  80 mg Oral Daily  . fosfomycin  3 g Oral Once  . levothyroxine  75 mcg Oral Q0600  . LORazepam  0.5 mg Oral Daily  . mouth rinse  15 mL Mouth Rinse BID  . methylPREDNISolone (SOLU-MEDROL) injection  60 mg Intravenous Q12H  . pantoprazole  40 mg Oral Daily  . sodium chloride flush  10-40 mL Intracatheter Q12H  . sodium chloride flush  3 mL Intravenous Q12H  . sodium zirconium cyclosilicate  5 g Oral Once  . traZODone  50 mg Oral QHS   Continuous Infusions: . sodium chloride Stopped (09/22/19 1719)   PRN Meds:.sodium chloride, acetaminophen **OR**  acetaminophen, HYDROcodone-acetaminophen, ondansetron **OR** ondansetron (ZOFRAN) IV, sodium chloride flush  DVT prophylaxis: Therapeutic Lovenox Code Status: DNR Family Communication: Called daughter Eben Burow [phone]  Disposition Plan: May need SNF   Consultants:  None  Procedures:  None   Microbiology: Urine cultures without significant growth  Antimicrobials: Meropenem  Fosfomycin x1 1/22  Objective: Vitals:   09/25/19 0300 09/25/19 0400 09/25/19 0500 09/25/19 0851  BP:  (!) 114/54    Pulse: 66 65 79   Resp: (!) 23 (!) 22 (!) 28   Temp:  98.5 F (36.9 C)    TempSrc:  Oral    SpO2: 91% 91% 91% 93%  Weight:   76.7 kg   Height:        Intake/Output Summary (Last 24 hours) at 09/25/2019 1119 Last data filed at 09/25/2019 0500 Gross per 24 hour  Intake 360 ml  Output 950 ml  Net -590 ml   Filed Weights   09/22/19 0500 09/24/19 0500 09/25/19 0500  Weight: 75.2 kg 76.7 kg 76.7 kg    Examination:  Constitutional: NAD Eyes: No scleral icterus ENMT: Moist mucous membranes Neck: normal, supple Respiratory: Clear bilaterally, no wheezing, no crackles, diminished at the bases Cardiovascular: Regular rate and rhythm, no murmurs, no peripheral edema Abdomen: Soft, NT, ND, positive bowel sounds Musculoskeletal: no clubbing / cyanosis.  Skin: No rashes Neurologic: No focal deficits   Data Reviewed: I have independently reviewed following labs and imaging studies   CBC: Recent Labs  Lab 09/20/19 2121 09/20/19 2121 09/20/19 2144 09/21/19 0543 09/23/19 0400 09/24/19 0500 09/25/19 0055  WBC 10.6*  --   --  7.8 8.3 9.1 11.6*  NEUTROABS 9.9*  --   --  6.9  --   --   --   HGB 11.3*   < > 11.6* 11.0* 10.9* 11.2* 11.9*  HCT 35.6*   < > 34.0* 35.8* 34.1* 35.7* 36.9  MCV 96.0  --   --  97.3 96.3 95.7 94.9  PLT 304  --   --  301 275 311 312   < > = values in this interval not displayed.   Basic Metabolic Panel: Recent Labs  Lab 09/21/19 1323  09/21/19 1323 09/21/19 1911 09/22/19 1422 09/23/19 0400 09/24/19 0500 09/25/19 0055  NA 136  --   --  138 134* 135 138  K 5.1   < > 4.6 4.9 5.6* 5.1 5.2*  CL 107  --   --  109 106 103 105  CO2 21*  --   --  23 21* 23 23  GLUCOSE 117*  --   --  111* 138* 163* 141*  BUN 78*  --   --  74* 74* 70* 83*  CREATININE 3.44*  --   --  2.93* 2.50* 1.96* 2.02*  CALCIUM 8.3*  --   --  8.5* 8.3* 8.4* 8.4*   < > = values in this interval not displayed.   GFR: Estimated Creatinine Clearance: 17.4 mL/min (A) (by C-G formula based on SCr of 2.02 mg/dL (H)). Liver Function Tests: Recent Labs  Lab 09/20/19 2121 09/22/19 1527 09/23/19  0400 09/24/19 0500 09/25/19 0055  AST 20 17 17 16  52*  ALT 14 12 10 12  35  ALKPHOS 72 55 53 59 71  BILITOT 0.4 0.6 0.5 0.5 0.7  PROT 6.6 5.7* 5.7* 5.9* 5.7*  ALBUMIN 3.0* 2.4* 2.4* 2.4* 2.4*   No results for input(s): LIPASE, AMYLASE in the last 168 hours. No results for input(s): AMMONIA in the last 168 hours. Coagulation Profile: No results for input(s): INR, PROTIME in the last 168 hours. Cardiac Enzymes: No results for input(s): CKTOTAL, CKMB, CKMBINDEX, TROPONINI in the last 168 hours. BNP (last 3 results) No results for input(s): PROBNP in the last 8760 hours. HbA1C: No results for input(s): HGBA1C in the last 72 hours. CBG: Recent Labs  Lab 09/21/19 1650 09/23/19 0729  GLUCAP 106* 127*   Lipid Profile: No results for input(s): CHOL, HDL, LDLCALC, TRIG, CHOLHDL, LDLDIRECT in the last 72 hours. Thyroid Function Tests: No results for input(s): TSH, T4TOTAL, FREET4, T3FREE, THYROIDAB in the last 72 hours. Anemia Panel: Recent Labs    09/23/19 0400 09/24/19 0500  FERRITIN 229 187   Urine analysis:    Component Value Date/Time   COLORURINE AMBER (A) 09/20/2019 2100   APPEARANCEUR HAZY (A) 09/20/2019 2100   LABSPEC 1.013 09/20/2019 2100   PHURINE 5.0 09/20/2019 2100   GLUCOSEU NEGATIVE 09/20/2019 2100   HGBUR NEGATIVE 09/20/2019 2100    [place] NEGATIVE 09/20/2019 2100   [place] NEGATIVE 09/20/2019 2100   PROTEINUR NEGATIVE 09/20/2019 2100   UROBILINOGEN 0.2 05/09/2011 1918   NITRITE NEGATIVE 09/20/2019 2100   LEUKOCYTESUR LARGE (A) 09/20/2019 2100   Sepsis Labs: Invalid input(s): PROCALCITONIN, LACTICIDVEN  Recent Results (from the past 240 hour(s))  Blood Culture (routine x 2)     Status: None (Preliminary result)   Collection Time: 09/20/19  8:30 PM   Specimen: BLOOD  Result Value Ref Range Status   Specimen Description   Final    BLOOD RIGHT ANTECUBITAL Performed at [facility] [address], [place], [place] [address]    Special Requests   Final    BOTTLES DRAWN AEROBIC AND ANAEROBIC Blood Culture results may not be optimal due to an excessive volume of blood received in culture bottles Performed at [facility] [address], [place], [place] [address]    Culture   Final    NO GROWTH 4 DAYS Performed at [facility], [place] [address], [place], [place] [address]    Report Status PENDING  Incomplete  Urine culture     Status: Abnormal   Collection Time: 09/20/19  9:00 PM   Specimen: Urine, Clean Catch  Result Value Ref Range Status   Specimen Description   Final    URINE, CLEAN CATCH Performed at [facility], [place] [address], [place], [place] [address]    Special Requests   Final    NONE Performed at [facility], [place] [address] [place], [place] [address]    Culture (A)  Final    <10,000 COLONIES/mL INSIGNIFICANT GROWTH Performed at [facility] [address], [place], [place] [address]    Report Status 09/21/2019 FINAL  Final  SARS CORONAVIRUS 2 (TAT 6-24 HRS) Nasopharyngeal Nasopharyngeal Swab     Status: Abnormal   Collection Time: 09/20/19 10:44 PM   Specimen: Nasopharyngeal Swab  Result Value Ref Range Status   SARS Coronavirus 2 POSITIVE (A) NEGATIVE Final    Comment: RESULT CALLED TO, READ  BACK BY AND  VERIFIED WITH: H. COOK,RN 0630 09/21/2019 T. TYSOR (NOTE) SARS-CoV-2 target nucleic acids are DETECTED. The SARS-CoV-2 RNA is generally detectable in upper and lower respiratory specimens during the acute phase of infection. Positive results are indicative of the presence of SARS-CoV-2 RNA. Clinical correlation with patient history and other diagnostic information is  necessary to determine patient infection status. Positive results do not rule out bacterial infection or co-infection with other viruses.  The expected result is Negative. Fact Sheet for Patients: SugarRoll.be Fact Sheet for Healthcare Providers: https://www.woods-mathews.com/ This test is not yet approved or cleared by the [place] FDA and  has been authorized for detection and/or diagnosis of SARS-CoV-2 by FDA under an Emergency Use Authorization (EUA). This EUA will remain  in effect (meaning this test can be used) for the  duration of the COVID-19 declaration under Section 564(b)(1) of the Act, 21 U.S.C. section 360bbb-3(b)(1), unless the authorization is terminated or revoked sooner. Performed at [facility], [place] [address] [place], [place] [address]   MRSA PCR Screening     Status: None   Collection Time: 09/21/19  5:43 AM   Specimen: Nasal Mucosa; Nasopharyngeal  Result Value Ref Range Status   MRSA by PCR NEGATIVE NEGATIVE Final    Comment:        The GeneXpert MRSA Assay (FDA approved for NASAL specimens only), is one component of a comprehensive MRSA colonization surveillance program. It is not intended to diagnose MRSA infection nor to guide or monitor treatment for MRSA infections. Performed at [facility], [place] [address], [place], [place] [address]   MRSA PCR Screening     Status: None   Collection Time: 09/21/19  4:47 PM   Specimen: Nasal Mucosa; Nasopharyngeal  Result Value Ref Range Status   MRSA by PCR  NEGATIVE NEGATIVE Final    Comment:        The GeneXpert MRSA Assay (FDA approved for NASAL specimens only), is one component of a comprehensive MRSA colonization surveillance program. It is not intended to diagnose MRSA infection nor to guide or monitor treatment for MRSA infections. Performed at [facility], [place] [address], [place], [place] [address]   Blood Culture (routine x 2)     Status: None (Preliminary result)   Collection Time: 09/21/19  7:11 PM   Specimen: BLOOD LEFT HAND  Result Value Ref Range Status   Specimen Description   Final    BLOOD LEFT HAND Performed at [facility] [address], [place], [place] [address]    Special Requests   Final    BOTTLES DRAWN AEROBIC AND ANAEROBIC Blood Culture adequate volume Performed at [facility] [address], [place], [place] [address]    Culture   Final    NO GROWTH 4 DAYS Performed at [facility], [place] [address] [place], [place] [address]    Report Status PENDING  Incomplete      Radiology Studies: CT ABDOMEN PELVIS WO CONTRAST  Result Date: 09/24/2019 CLINICAL DATA:  Lower abdominal and pelvic pain. EXAM: CT ABDOMEN AND PELVIS WITHOUT CONTRAST TECHNIQUE: Multidetector CT imaging of the abdomen and pelvis was performed following the standard protocol without IV contrast. COMPARISON:  CT scan 05/10/2011 FINDINGS: Exam significantly limited by patient motion. Lower chest: Small bilateral pleural effusions and bibasilar infiltrates. The heart is mildly enlarged. Stable vascular calcifications. Hepatobiliary: No focal hepatic lesions or intrahepatic biliary dilatation. Gallbladder is grossly normal. No obvious common bile duct  dilatation. Pancreas: Very poorly visualized due to motion artifact. No gross abnormality. Spleen: Normal size. Adrenals/Urinary Tract: No obvious renal abnormality. No hydronephrosis or renal calculi. The left kidney is small. The  bladder is grossly normal. Mild bladder distention. No bladder mass or calculi. Stomach/Bowel: Stomach, duodenum, small bowel and colon are grossly normal but exam quite limited by motion artifact. No obvious acute obstructive findings or mass lesions. Colonic diverticulosis without definite findings for acute diverticulitis. Vascular/Lymphatic: Advanced vascular calcifications. No definite aneurysm. No mesenteric or retroperitoneal mass or adenopathy is identified. Reproductive: The uterus and ovaries are unremarkable. Other: No pelvic mass or pelvic lymphadenopathy. No free pelvic fluid collections. No inguinal mass. Small left inguinal hernia containing fat. Diffuse bulging anterior abdominal wall but no discrete hernia. Musculoskeletal: Scoliosis and degenerative lumbar spondylosis. Remote vertebral augmentation changes noted at T12. No acute bony findings or destructive bony changes. IMPRESSION: 1. Very limited examination due to motion artifact. 2. Small bilateral pleural effusions and bibasilar infiltrates. 3. No obvious acute abdominal/pelvic findings, mass lesions or adenopathy. Electronically Signed   By: Marijo Sanes M.D.   On: 09/24/2019 17:02    Marzetta Board, MD, PhD [facility]  Contact via  [email or web address]  Portal P: [phone] F: [phone]

## 2019-09-25 NOTE — Evaluation (Signed)
Physical Therapy Evaluation Patient Details Name: Krystal Mckenzie MRN: [id number] DOB: 1926-12-28 Today's Date: 09/25/2019   History of Present Illness  86 y.o. female with medical history significant for chronic diastolic CHF, hypertension, hypothyroidism, chronic kidney disease, now presenting from her nursing facility for evaluation of malaise.  Patient was reportedly diagnosed with COVID-19 recently (more than a month ago per patient, less than a week ago per her daughter, and roughly 2 weeks ago per nursing home personnel), was started on 2 to 3 L of supplemental oxygen and steroids at the nursing home, was receiving antibiotics for a recurrent UTI,  Clinical Impression   Pt admitted with above hx and dx. Pt was loving at skilled facility prior to this, her daughter states she was ambulatory but pt herself denies this. Pt states was able to transfer tp chair with assist from staff who did not use machine. This am pt is in great pain with all movement, nurse reports she had been medicated already. Pt unable to move BLE (trave movement noted in RLE) but also can not stand for anyone to assist with leg movements, she screams in pain. Pt was agreeable to attempting to sit edge of bed and was able to get to edge of bed, posture is quite poor and slouched but was able to sit approx 10 mins supported and unsupported at times. Pt was on 3L/min via [place] and sats deceased with movement but once sitting was able to maintain 90s with cues for pursed lip breathing. At this time pt is max-total assist with all functional mobility, was not able to transfer to recliner d/t c/o pain. Pt would benefit from skilled PT tx while in hospital to increase activity tolerance, safety and independence with mobility. Pt would benefit from post acute care rehab after d/c, if this is available through home facility would be ideal.    Follow Up Recommendations SNF    Equipment Recommendations  None recommended by PT     Recommendations for Other Services OT consult     Precautions / Restrictions Precautions Precautions: Fall;Other (comment) Precaution Comments: exquisite tenderness to touch Restrictions Weight Bearing Restrictions: No      Mobility  Bed Mobility Overal bed mobility: Needs Assistance Bed Mobility: Sit to Supine;Supine to Sit;Rolling Rolling: Total assist   Supine to sit: Total assist;+2 for physical assistance Sit to supine: Total assist;+2 for physical assistance      Transfers Overall transfer level: Needs assistance               General transfer comment: pt will need maxi mover to be able to transfer to recliner  Ambulation/Gait             General Gait Details: daughter had stated pt was ambulatoru but pt states she was not, this am pt was unable to stand without total assist and this was with crying in pain  Stairs            Wheelchair Mobility    Modified Rankin (Stroke Patients Only)       Balance Overall balance assessment: Needs assistance Sitting-balance support: Bilateral upper extremity supported;Feet supported Sitting balance-Leahy Scale: Poor   Postural control: Right lateral lean;Posterior lean   Standing balance-Leahy Scale: Zero Standing balance comment: unable to stand today                             Pertinent Vitals/Pain Pain Assessment: Faces Faces Pain Scale: Hurts whole  lot Pain Location: mainly in RLE but with any movement Pain Descriptors / Indicators: Crying;Discomfort;Grimacing;Guarding;Moaning Pain Intervention(s): Limited activity within patient's tolerance;Monitored during session    [place] expects to be discharged to:: Skilled nursing facility                 Additional Comments: assisted with all functional mob, daughter states she was ambulatory but pt states she was not    Prior Function Level of Independence: Needs assistance   Gait / Transfers Assistance  Needed: states was transfered by "hand" and not lift/machine  ADL's / Homemaking Assistance Needed: assisted by staff with all functional mob        Hand Dominance        Extremity/Trunk Assessment   Upper Extremity Assessment Upper Extremity Assessment: LUE deficits/detail RUE Deficits / Details: poor use of UE strength grossly 2/5 LUE Deficits / Details: strength grossly 2/5    Lower Extremity Assessment Lower Extremity Assessment: RLE deficits/detail;LLE deficits/detail RLE Deficits / Details: able to move with 1/5 strength LLE Deficits / Details: less than trace movement observed, pt attempted to use RLE to move LLE unsucessfully       Communication   Communication: HOH  Cognition Arousal/Alertness: Lethargic Behavior During Therapy: Agitated;Anxious Overall Cognitive Status: No family/caregiver present to determine baseline cognitive functioning                                 General Comments: therapist attempted to contact home facility but was unable to get someone on phone to discuss PLOF      General Comments General comments (skin integrity, edema, etc.): Pt was on 3L/min via [place] and desat to mid 80s with activity (sitting edge of bed). with pursed lip breathing able to maintain sats in 90s most of time sitting but throughout session pt was c/o not being able to catch her breath    Exercises Other Exercises Other Exercises: pursed lip breathing with continued cues   Assessment/Plan    PT Assessment Patient needs continued PT services  PT Problem List Decreased strength;Decreased range of motion;Decreased activity tolerance;Decreased balance;Decreased mobility;Decreased coordination;Decreased cognition;Decreased knowledge of use of DME;Decreased safety awareness       PT Treatment Interventions Functional mobility training;Therapeutic activities;Therapeutic exercise;Balance training;Neuromuscular re-education;Patient/family education    PT  Goals (Current goals can be found in the Care Plan section)  Acute Rehab PT Goals Patient Stated Goal: did not state goals this am PT Goal Formulation: Patient unable to participate in goal setting Time For Goal Achievement: 10/09/19 Potential to Achieve Goals: Fair    Frequency Min 2X/week   Barriers to discharge        Co-evaluation               AM-PAC PT "6 Clicks" Mobility  Outcome Measure Help needed turning from your back to your side while in a flat bed without using bedrails?: Total Help needed moving from lying on your back to sitting on the side of a flat bed without using bedrails?: Total Help needed moving to and from a bed to a chair (including a wheelchair)?: Total Help needed standing up from a chair using your arms (e.g., wheelchair or bedside chair)?: Total Help needed to walk in hospital room?: Total Help needed climbing 3-5 steps with a railing? : Total 6 Click Score: 6    End of Session Equipment Utilized During Treatment: Oxygen Activity Tolerance: Patient limited by  fatigue;Patient limited by lethargy;Patient limited by pain;Treatment limited secondary to medical complications (Comment) Patient left: in bed;with call bell/phone within reach Nurse Communication: Mobility status PT Visit Diagnosis: Other abnormalities of gait and mobility (R26.89);Muscle weakness (generalized) (M62.81);Pain Pain - Right/Left: Left Pain - part of body: Leg    Time: [id number] PT Time Calculation (min) (ACUTE ONLY): 54 min   Charges:   PT Evaluation $PT Eval Moderate Complexity: 1 Mod PT Treatments $Therapeutic Activity: 23-37 mins $Self Care/Home Management: 8-22        Horald Chestnut, PT   Delford Field 09/25/2019, 1:59 PM

## 2019-09-26 LAB — CBC
HCT: 34 % — ABNORMAL LOW (ref 36.0–46.0)
Hemoglobin: 11 g/dL — ABNORMAL LOW (ref 12.0–15.0)
MCH: 31 pg (ref 26.0–34.0)
MCHC: 32.4 g/dL (ref 30.0–36.0)
MCV: 95.8 fL (ref 80.0–100.0)
Platelets: 328 10*3/uL (ref 150–400)
RBC: 3.55 MIL/uL — ABNORMAL LOW (ref 3.87–5.11)
RDW: 14.7 % (ref 11.5–15.5)
WBC: 11.7 10*3/uL — ABNORMAL HIGH (ref 4.0–10.5)
nRBC: 0 % (ref 0.0–0.2)

## 2019-09-26 LAB — CULTURE, BLOOD (ROUTINE X 2)
Culture: NO GROWTH
Culture: NO GROWTH
Special Requests: ADEQUATE

## 2019-09-26 LAB — COMPREHENSIVE METABOLIC PANEL
ALT: 58 U/L — ABNORMAL HIGH (ref 0–44)
AST: 49 U/L — ABNORMAL HIGH (ref 15–41)
Albumin: 2.2 g/dL — ABNORMAL LOW (ref 3.5–5.0)
Alkaline Phosphatase: 64 U/L (ref 38–126)
Anion gap: 8 (ref 5–15)
BUN: 90 mg/dL — ABNORMAL HIGH (ref 8–23)
CO2: 23 mmol/L (ref 22–32)
Calcium: 8.3 mg/dL — ABNORMAL LOW (ref 8.9–10.3)
Chloride: 107 mmol/L (ref 98–111)
Creatinine, Ser: 1.91 mg/dL — ABNORMAL HIGH (ref 0.44–1.00)
GFR calc Af Amer: 26 mL/min — ABNORMAL LOW (ref >60)
GFR calc non Af Amer: 23 mL/min — ABNORMAL LOW (ref >60)
Glucose, Bld: 130 mg/dL — ABNORMAL HIGH (ref 70–99)
Potassium: 4.7 mmol/L (ref 3.5–5.1)
Sodium: 138 mmol/L (ref 135–145)
Total Bilirubin: 0.3 mg/dL (ref 0.3–1.2)
Total Protein: 5.3 g/dL — ABNORMAL LOW (ref 6.5–8.1)

## 2019-09-26 LAB — C-REACTIVE PROTEIN: CRP: 1.3 mg/dL — ABNORMAL HIGH (ref <1.0)

## 2019-09-26 MED ORDER — LORAZEPAM 1 MG PO TABS
0.5000 mg | ORAL_TABLET | Freq: Two times a day (BID) | ORAL | Status: DC | PRN
  Administered 2019-09-26 – 2019-09-27 (×3): 0.5 mg via ORAL
  Filled 2019-09-26 (×3): qty 1

## 2019-09-26 MED ORDER — SENNOSIDES-DOCUSATE SODIUM 8.6-50 MG PO TABS
2.0000 | ORAL_TABLET | Freq: Two times a day (BID) | ORAL | Status: DC
  Administered 2019-09-26 – 2019-09-28 (×4): 2 via ORAL
  Filled 2019-09-26 (×4): qty 2

## 2019-09-26 MED ORDER — APIXABAN 5 MG PO TABS
5.0000 mg | ORAL_TABLET | Freq: Two times a day (BID) | ORAL | Status: DC
  Administered 2019-09-27 – 2019-09-28 (×3): 5 mg via ORAL
  Filled 2019-09-26 (×3): qty 1

## 2019-09-26 NOTE — Progress Notes (Addendum)
[place] for LMWH Indication: r/o PE, elevated D dimer, unable to do CTA 2nd AKI  Allergies  Allergen Reactions  . Morphine And Related   . Cephalexin Hives, Swelling and Rash  . Penicillins Swelling and Rash    Did it involve swelling of the face/tongue/throat, SOB, or low BP? Unknown Did it involve sudden or severe rash/hives, skin peeling, or any reaction on the inside of your mouth or nose? Unknown Did you need to seek medical attention at a hospital or doctor's office? Unknown When did it last happen? Unknown If all above answers are "NO", may proceed with cephalosporin use.     Patient Measurements: Height: 5\' 2"  (157.5 cm) Weight: 169 lb 1 oz (76.7 kg)(wt questionable,unable to obtain correctly, see note) IBW/kg (Calculated) : 50.1 Heparin Dosing Weight: 65.6 kg  Vital Signs: Temp: 98.1 F (36.7 C) (01/23 0724) Temp Source: Oral (01/23 0724) BP: 124/57 (01/23 0724) Pulse Rate: 70 (01/23 0724)  Labs: Recent Labs    09/24/19 0500 09/24/19 0500 09/25/19 0055 09/26/19 0600  HGB 11.2*   < > 11.9* 11.0*  HCT 35.7*  --  36.9 34.0*  PLT 311  --  312 328  CREATININE 1.96*  --  2.02* 1.91*   < > = values in this interval not displayed.    Estimated Creatinine Clearance: 18.4 mL/min (A) (by C-G formula based on SCr of 1.91 mg/dL (H)).  Assessment: 86 yo F with Covid.  Pharmacy consulted to start full dose LMWH for r/o PE.  Unable to do CTA 2nd AKI.  SCr down to 1.91.  CrCl < 30 ml/min.  1/19 dopplers negative for DVT but limited exam due to pain. Hgb stable 11s, PLTC WNL.  No bleeding reported.   Goal of Therapy:  Anti-Xa level 0.6-1 units/ml 4hrs after LMWH dose given Monitor platelets by anticoagulation protocol: Yes   Plan:  Continue Lovenox 75 mg SQ q24h CBC q72h Monitor for s/sx of bleeding F/U plan for long-term anticoagulation  Addendum: Pharmacy asked to transition to apixaban. Physician discussed benefit/risks  with family. Last Lovenox dose at 13:06.  D/C Lovenox Apixaban 5 mg PO bid - 1st dose tomorrow Education prior to discharge Pharmacy signing off but will continue to follow peripherally - please re-consult if needed  Thank you for involving pharmacy in this patient's care.  Renold Genta, PharmD, BCPS Clinical Pharmacist Clinical phone for 09/26/2019 until 3:30p is [phone] 09/26/2019 1:34 PM  **Pharmacist phone directory can be found on [email or web address] listed under [facility]**

## 2019-09-26 NOTE — Progress Notes (Addendum)
PROGRESS NOTE  Krystal Mckenzie [id number] DOB: [birth year] DOA: 09/20/2019 PCP: Patient, No Pcp Per   LOS: 6 days   Brief Narrative / Interim history: 86 year old female with chronic diastolic CHF, HTN, hypothyroidism, CKD 4, came from [facility] facility for malaise, fatigue, hypoxia in the setting of recent COVID-19 infection.  She was diagnosed with Covid roughly couple of weeks ago per SNF, initially requiring 2 to 3 L and she was also given steroids.  She was also receiving antibiotics for recurrent UTIs initially nitrofurantoin which was later changed to [place] in the last 3 days prior to admission.  She reports chronic edema on her legs which has recently resolved however she has been having chronic tenderness.  She also had some GI symptoms with diarrhea which is now resolved.  Her suprapubic pain and dysuria have improved on antibiotics.  Chest x-ray on admission showed bibasilar opacities.  She tested positive for COVID-19  Subjective / 24h Interval events: Reports pain all over, states that has been like this for years.  She tells me I am not doing anything for her  Assessment & Plan:  Principal Problem Acute Hypoxic Respiratory Failure due to Covid-19 Viral Illness -Chest x-ray on admission showed bibasilar opacities worrisome for multifocal pneumonia in the setting of COVID-19 -Continue IV steroids -Completed 5 days of remdesivir -Received convalescent plasma -Given elevated D-dimer and worsening hypoxia she was started on therapeutic anticoagulation with Lovenox.  Unable to do CT angiogram due to chronic kidney disease, lower extremity Dopplers negative for DVT -Inflammatory markers improving, CRP, D-dimer.  Remains on 2 L nasal cannula.  PT recommended SNF to which daughter is agreeable  COVID-19 Labs  Recent Labs    09/24/19 0500 09/26/19 0600  DDIMER 8.82*  --   FERRITIN 187  --   CRP 2.7* 1.3*    Lab Results  Component Value Date   SARSCOV2NAA  POSITIVE (A) 09/20/2019   Active Problems History of recurrent UTIs -Recently placed on nitrofurantoin and switch to Bactrim, during this hospital stay received meropenem for about 5 days, she still has symptoms and will give fosfomycin x1  Acute kidney injury on chronic kidney disease stage IV  -Not clear baseline creatinine but in 2012 it was 1.9.  Her creatinine during this admission went as high as 4.5, currently stable around 1.9-2.0 which I believe is at her baseline  Hyperkalemia -Improved  Hypothyroidism -Continue Synthroid  Hypotension with history of hypertension -BP now stable.  Antihypertensives on hold.  Chronic diastolic CHF -Currently holding diuretics due to borderline BP, poor renal function -Follow volume status closely, euvolemic  "Vaginal" pain/lower pelvic pain -This appears to be new, daughter tells me that she has been complaining of that for the past several weeks and that is the main reason why she came to the hospital -CT scan of the abdomen and pelvis without acute findings.  In discussion with bedside RN she does have a rash in the area which is improving with local topical care.  Continue.  Scheduled Meds: . acidophilus  2 capsule Oral BID WC  . aspirin EC  81 mg Oral QPM  . Chlorhexidine Gluconate Cloth  6 each Topical Daily  . enoxaparin (LOVENOX) injection  1 mg/kg Subcutaneous Q24H  . febuxostat  80 mg Oral Daily  . levothyroxine  75 mcg Oral Q0600  . LORazepam  0.5 mg Oral Daily  . mouth rinse  15 mL Mouth Rinse BID  . methylPREDNISolone (SOLU-MEDROL) injection  60 mg Intravenous Q12H  .  pantoprazole  40 mg Oral Daily  . sodium chloride flush  10-40 mL Intracatheter Q12H  . sodium chloride flush  3 mL Intravenous Q12H  . traZODone  50 mg Oral QHS   Continuous Infusions: . sodium chloride Stopped (09/22/19 1719)   PRN Meds:.sodium chloride, acetaminophen **OR** acetaminophen, HYDROcodone-acetaminophen, ondansetron **OR** ondansetron  (ZOFRAN) IV, sodium chloride flush  DVT prophylaxis: Therapeutic Lovenox Code Status: DNR Family Communication: Called daughter Eben Burow [phone], also called Dr. Romualdo Bolk [phone], no answer Disposition Plan: May need SNF   Consultants:  None  Procedures:  None   Microbiology: Urine cultures without significant growth  Antimicrobials: Meropenem  Fosfomycin x1 1/22  Objective: Vitals:   09/25/19 1904 09/25/19 1932 09/26/19 0547 09/26/19 0724  BP: (!) 144/62  (!) 126/54 (!) 124/57  Pulse: 98  76 70  Resp: (!) 22  19 18   Temp: 98.6 F (37 C) 98.1 F (36.7 C) 98.2 F (36.8 C) 98.1 F (36.7 C)  TempSrc: Oral Oral Oral Oral  SpO2: 95%  96% 97%  Weight:      Height:        Intake/Output Summary (Last 24 hours) at 09/26/2019 1451 Last data filed at 09/26/2019 0600 Gross per 24 hour  Intake 360 ml  Output 1000 ml  Net -640 ml   Filed Weights   09/22/19 0500 09/24/19 0500 09/25/19 0500  Weight: 75.2 kg 76.7 kg 76.7 kg    Examination:  Constitutional: No apparent distress Eyes: no icterus  ENMT: mmm Neck: normal, supple Respiratory: CTA biL, diminished at the bases Cardiovascular: RRR, no MRG, trace edema Abdomen: soft, NT, ND, BS+ Musculoskeletal: no clubbing / cyanosis.  Skin: no rashes Neurologic: no deficits   Data Reviewed: I have independently reviewed following labs and imaging studies   CBC: Recent Labs  Lab 09/20/19 2121 09/20/19 2144 09/21/19 0543 09/23/19 0400 09/24/19 0500 09/25/19 0055 09/26/19 0600  WBC 10.6*   < > 7.8 8.3 9.1 11.6* 11.7*  NEUTROABS 9.9*  --  6.9  --   --   --   --   HGB 11.3*   < > 11.0* 10.9* 11.2* 11.9* 11.0*  HCT 35.6*   < > 35.8* 34.1* 35.7* 36.9 34.0*  MCV 96.0   < > 97.3 96.3 95.7 94.9 95.8  PLT 304   < > 301 275 311 312 328   < > = values in this interval not displayed.   Basic Metabolic Panel: Recent Labs  Lab 09/22/19 1422 09/23/19 0400 09/24/19 0500 09/25/19 0055 09/26/19 0600  NA  138 134* 135 138 138  K 4.9 5.6* 5.1 5.2* 4.7  CL 109 106 103 105 107  CO2 23 21* 23 23 23   GLUCOSE 111* 138* 163* 141* 130*  BUN 74* 74* 70* 83* 90*  CREATININE 2.93* 2.50* 1.96* 2.02* 1.91*  CALCIUM 8.5* 8.3* 8.4* 8.4* 8.3*   GFR: Estimated Creatinine Clearance: 18.4 mL/min (A) (by C-G formula based on SCr of 1.91 mg/dL (H)). Liver Function Tests: Recent Labs  Lab 09/22/19 1527 09/23/19 0400 09/24/19 0500 09/25/19 0055 09/26/19 0600  AST 17 17 16  52* 49*  ALT 12 10 12  35 58*  ALKPHOS 55 53 59 71 64  BILITOT 0.6 0.5 0.5 0.7 0.3  PROT 5.7* 5.7* 5.9* 5.7* 5.3*  ALBUMIN 2.4* 2.4* 2.4* 2.4* 2.2*   No results for input(s): LIPASE, AMYLASE in the last 168 hours. No results for input(s): AMMONIA in the last 168 hours. Coagulation Profile: No results for input(s): INR,  PROTIME in the last 168 hours. Cardiac Enzymes: No results for input(s): CKTOTAL, CKMB, CKMBINDEX, TROPONINI in the last 168 hours. BNP (last 3 results) No results for input(s): PROBNP in the last 8760 hours. HbA1C: No results for input(s): HGBA1C in the last 72 hours. CBG: Recent Labs  Lab 09/21/19 1650 09/23/19 0729  GLUCAP 106* 127*   Lipid Profile: No results for input(s): CHOL, HDL, LDLCALC, TRIG, CHOLHDL, LDLDIRECT in the last 72 hours. Thyroid Function Tests: No results for input(s): TSH, T4TOTAL, FREET4, T3FREE, THYROIDAB in the last 72 hours. Anemia Panel: Recent Labs    09/24/19 0500  FERRITIN 187   Urine analysis:    Component Value Date/Time   COLORURINE AMBER (A) 09/20/2019 2100   APPEARANCEUR HAZY (A) 09/20/2019 2100   LABSPEC 1.013 09/20/2019 2100   PHURINE 5.0 09/20/2019 2100   GLUCOSEU NEGATIVE 09/20/2019 2100   HGBUR NEGATIVE 09/20/2019 2100   [place] NEGATIVE 09/20/2019 2100   [place] NEGATIVE 09/20/2019 2100   PROTEINUR NEGATIVE 09/20/2019 2100   UROBILINOGEN 0.2 05/09/2011 1918   NITRITE NEGATIVE 09/20/2019 2100   LEUKOCYTESUR LARGE (A) 09/20/2019 2100   Sepsis  Labs: Invalid input(s): PROCALCITONIN, LACTICIDVEN  Recent Results (from the past 240 hour(s))  Blood Culture (routine x 2)     Status: None   Collection Time: 09/20/19  8:30 PM   Specimen: BLOOD  Result Value Ref Range Status   Specimen Description   Final    BLOOD RIGHT ANTECUBITAL Performed at [facility] [address] [place], [place] [address]    Special Requests   Final    BOTTLES DRAWN AEROBIC AND ANAEROBIC Blood Culture results may not be optimal due to an excessive volume of blood received in culture bottles Performed at [facility] [address] [place], [place] [address]    Culture   Final    NO GROWTH 5 DAYS Performed at [facility], [place] [address] [place], [place] [address]    Report Status 09/26/2019 FINAL  Final  Urine culture     Status: Abnormal   Collection Time: 09/20/19  9:00 PM   Specimen: Urine, Clean Catch  Result Value Ref Range Status   Specimen Description   Final    URINE, CLEAN CATCH Performed at [facility], [place] [address] [place], [place] [address]    Special Requests   Final    NONE Performed at [facility], [place] [address] [place], [place] [address]    Culture (A)  Final    <10,000 COLONIES/mL INSIGNIFICANT GROWTH Performed at [facility] [address] [place], [place] [address]    Report Status 09/21/2019 FINAL  Final  SARS CORONAVIRUS 2 (TAT 6-24 HRS) Nasopharyngeal Nasopharyngeal Swab     Status: Abnormal   Collection Time: 09/20/19 10:44 PM   Specimen: Nasopharyngeal Swab  Result Value Ref Range Status   SARS Coronavirus 2 POSITIVE (A) NEGATIVE Final    Comment: RESULT CALLED TO, READ BACK BY AND VERIFIED WITH: H. COOK,RN 0630 09/21/2019 T. TYSOR (NOTE) SARS-CoV-2 target nucleic acids are DETECTED. The SARS-CoV-2 RNA is generally detectable in upper and lower respiratory specimens during the acute phase of infection.  Positive results are indicative of the presence of SARS-CoV-2 RNA. Clinical correlation with patient history and other diagnostic information is  necessary to determine patient infection status. Positive results do not rule out bacterial infection or co-infection with other viruses.  The expected result is Negative. Fact Sheet  for Patients: SugarRoll.be Fact Sheet for Healthcare Providers: https://www.woods-mathews.com/ This test is not yet approved or cleared by the [place] FDA and  has been authorized for detection and/or diagnosis of SARS-CoV-2 by FDA under an Emergency Use Authorization (EUA). This EUA will remain  in effect (meaning this test can be used) for the  duration of the COVID-19 declaration under Section 564(b)(1) of the Act, 21 U.S.C. section 360bbb-3(b)(1), unless the authorization is terminated or revoked sooner. Performed at [facility], [place] [address] [place], [place] [address]   MRSA PCR Screening     Status: None   Collection Time: 09/21/19  5:43 AM   Specimen: Nasal Mucosa; Nasopharyngeal  Result Value Ref Range Status   MRSA by PCR NEGATIVE NEGATIVE Final    Comment:        The GeneXpert MRSA Assay (FDA approved for NASAL specimens only), is one component of a comprehensive MRSA colonization surveillance program. It is not intended to diagnose MRSA infection nor to guide or monitor treatment for MRSA infections. Performed at [facility], [place] [address], [place], [place] [address]   MRSA PCR Screening     Status: None   Collection Time: 09/21/19  4:47 PM   Specimen: Nasal Mucosa; Nasopharyngeal  Result Value Ref Range Status   MRSA by PCR NEGATIVE NEGATIVE Final    Comment:        The GeneXpert MRSA Assay (FDA approved for NASAL specimens only), is one component of a comprehensive MRSA colonization surveillance program. It is not intended to diagnose MRSA infection nor to  guide or monitor treatment for MRSA infections. Performed at [facility], [place] [address], [place], [place] [address]   Blood Culture (routine x 2)     Status: None   Collection Time: 09/21/19  7:11 PM   Specimen: BLOOD LEFT HAND  Result Value Ref Range Status   Specimen Description   Final    BLOOD LEFT HAND Performed at [facility] [address], [place], [place] [address]    Special Requests   Final    BOTTLES DRAWN AEROBIC AND ANAEROBIC Blood Culture adequate volume Performed at [facility] [address] [place], [place] [address]    Culture   Final    NO GROWTH 5 DAYS Performed at [facility], [place] [address] [place], [place] [address]    Report Status 09/26/2019 FINAL  Final      Radiology Studies: CT ABDOMEN PELVIS WO CONTRAST  Result Date: 09/24/2019 CLINICAL DATA:  Lower abdominal and pelvic pain. EXAM: CT ABDOMEN AND PELVIS WITHOUT CONTRAST TECHNIQUE: Multidetector CT imaging of the abdomen and pelvis was performed following the standard protocol without IV contrast. COMPARISON:  CT scan 05/10/2011 FINDINGS: Exam significantly limited by patient motion. Lower chest: Small bilateral pleural effusions and bibasilar infiltrates. The heart is mildly enlarged. Stable vascular calcifications. Hepatobiliary: No focal hepatic lesions or intrahepatic biliary dilatation. Gallbladder is grossly normal. No obvious common bile duct dilatation. Pancreas: Very poorly visualized due to motion artifact. No gross abnormality. Spleen: Normal size. Adrenals/Urinary Tract: No obvious renal abnormality. No hydronephrosis or renal calculi. The left kidney is small. The bladder is grossly normal. Mild bladder distention. No bladder mass or calculi. Stomach/Bowel: Stomach, duodenum, small bowel and colon are grossly normal but exam quite limited by motion artifact. No obvious acute obstructive findings or mass lesions. Colonic  diverticulosis without definite findings for acute diverticulitis. Vascular/Lymphatic: Advanced vascular calcifications.  No definite aneurysm. No mesenteric or retroperitoneal mass or adenopathy is identified. Reproductive: The uterus and ovaries are unremarkable. Other: No pelvic mass or pelvic lymphadenopathy. No free pelvic fluid collections. No inguinal mass. Small left inguinal hernia containing fat. Diffuse bulging anterior abdominal wall but no discrete hernia. Musculoskeletal: Scoliosis and degenerative lumbar spondylosis. Remote vertebral augmentation changes noted at T12. No acute bony findings or destructive bony changes. IMPRESSION: 1. Very limited examination due to motion artifact. 2. Small bilateral pleural effusions and bibasilar infiltrates. 3. No obvious acute abdominal/pelvic findings, mass lesions or adenopathy. Electronically Signed   By: Marijo Sanes M.D.   On: 09/24/2019 17:02    Marzetta Board, MD, PhD [facility]  Contact via  [email or web address]  [place] P: [phone] F: [phone]

## 2019-09-26 NOTE — Progress Notes (Signed)
Shift summary - Pt resting, arousable, last pain med at 0450, pt teaching r/t next pain dosing and need for turn and position done w/little positive effect. Lungs dim, O2 @2L , good sats, appetite fair, purewick patent, no BM last evening.  Pt dressing on buttocks continue as well as protective foam to bilateral heals. Report to next shift at door/bedside. Pt stable and unremarkable at time of this RN departure from floor.

## 2019-09-26 NOTE — Plan of Care (Signed)
  Problem: Nutrition: Goal: Adequate nutrition will be maintained Outcome: Progressing   Problem: Pain Managment: Goal: General experience of comfort will improve Outcome: Progressing   Problem: Safety: Goal: Ability to remain free from injury will improve Outcome: Progressing   Problem: Activity: Goal: Risk for activity intolerance will decrease Outcome: Not Progressing   Problem: Coping: Goal: Level of anxiety will decrease Outcome: Not Progressing   Problem: Skin Integrity: Goal: Risk for impaired skin integrity will decrease Outcome: Not Progressing Note: Pt not compliant with nursing repositioning off back and sacrum/buttocks to improve healing of stage decubitis on bil buttocks. Continued teaching and encouragment needed.

## 2019-09-27 ENCOUNTER — Inpatient Hospital Stay (HOSPITAL_COMMUNITY): Payer: Medicare Other

## 2019-09-27 LAB — CBC
HCT: 34.1 % — ABNORMAL LOW (ref 36.0–46.0)
Hemoglobin: 10.9 g/dL — ABNORMAL LOW (ref 12.0–15.0)
MCH: 30.5 pg (ref 26.0–34.0)
MCHC: 32 g/dL (ref 30.0–36.0)
MCV: 95.5 fL (ref 80.0–100.0)
Platelets: 340 10*3/uL (ref 150–400)
RBC: 3.57 MIL/uL — ABNORMAL LOW (ref 3.87–5.11)
RDW: 14.6 % (ref 11.5–15.5)
WBC: 21.3 10*3/uL — ABNORMAL HIGH (ref 4.0–10.5)
nRBC: 0 % (ref 0.0–0.2)

## 2019-09-27 LAB — COMPREHENSIVE METABOLIC PANEL
ALT: 46 U/L — ABNORMAL HIGH (ref 0–44)
AST: 29 U/L (ref 15–41)
Albumin: 2.4 g/dL — ABNORMAL LOW (ref 3.5–5.0)
Alkaline Phosphatase: 57 U/L (ref 38–126)
Anion gap: 6 (ref 5–15)
BUN: 84 mg/dL — ABNORMAL HIGH (ref 8–23)
CO2: 24 mmol/L (ref 22–32)
Calcium: 8.3 mg/dL — ABNORMAL LOW (ref 8.9–10.3)
Chloride: 108 mmol/L (ref 98–111)
Creatinine, Ser: 1.74 mg/dL — ABNORMAL HIGH (ref 0.44–1.00)
GFR calc Af Amer: 29 mL/min — ABNORMAL LOW (ref >60)
GFR calc non Af Amer: 25 mL/min — ABNORMAL LOW (ref >60)
Glucose, Bld: 133 mg/dL — ABNORMAL HIGH (ref 70–99)
Potassium: 5.4 mmol/L — ABNORMAL HIGH (ref 3.5–5.1)
Sodium: 138 mmol/L (ref 135–145)
Total Bilirubin: 0.8 mg/dL (ref 0.3–1.2)
Total Protein: 5.7 g/dL — ABNORMAL LOW (ref 6.5–8.1)

## 2019-09-27 LAB — D-DIMER, QUANTITATIVE: D-Dimer, Quant: 2.79 ug/mL-FEU — ABNORMAL HIGH (ref 0.00–0.50)

## 2019-09-27 MED ORDER — POLYETHYLENE GLYCOL 3350 17 G PO PACK
17.0000 g | PACK | Freq: Every day | ORAL | Status: DC
  Administered 2019-09-27 – 2019-09-28 (×2): 17 g via ORAL
  Filled 2019-09-27 (×2): qty 1

## 2019-09-27 MED ORDER — TORSEMIDE 10 MG PO TABS
10.0000 mg | ORAL_TABLET | Freq: Every day | ORAL | Status: DC
  Administered 2019-09-27 – 2019-09-28 (×2): 10 mg via ORAL
  Filled 2019-09-27 (×2): qty 1

## 2019-09-27 NOTE — Progress Notes (Signed)
PROGRESS NOTE  Krystal Mckenzie [id number] DOB: 19-Feb-1927 DOA: 09/20/2019 PCP: Patient, No Pcp Per   LOS: 7 days   Brief Narrative / Interim history: 86 year old female with chronic diastolic CHF, HTN, hypothyroidism, CKD 4, came from [place] facility for malaise, fatigue, hypoxia in the setting of recent COVID-19 infection.  She was diagnosed with Covid roughly couple of weeks ago per SNF, initially requiring 2 to 3 L and she was also given steroids.  She was also receiving antibiotics for recurrent UTIs initially nitrofurantoin which was later changed to [place] in the last 3 days prior to admission.  She reports chronic edema on her legs which has recently resolved however she has been having chronic tenderness.  She also had some GI symptoms with diarrhea which is now resolved.  Her suprapubic pain and dysuria have improved on antibiotics.  Chest x-ray on admission showed bibasilar opacities.  She tested positive for COVID-19  Subjective / 24h Interval events: Again reports pain in her legs, chronic.  No longer reports pain "all over".  Mention to the RN that she is short of breath but did not say that to me  Assessment & Plan:  Principal Problem Acute Hypoxic Respiratory Failure due to Covid-19 Viral Illness -Chest x-ray on admission showed bibasilar opacities worrisome for multifocal pneumonia in the setting of COVID-19 -Continue IV steroids -Completed 5 days of remdesivir -Received convalescent plasma -Given elevated D-dimer and worsening hypoxia she was started on therapeutic anticoagulation with Lovenox.  Unable to do CT angiogram due to chronic kidney disease, lower extremity Dopplers negative for DVT -Inflammatory markers improving, CRP, D-dimer.  Remains on 2 L nasal cannula.  PT recommended SNF to which daughter is agreeable -Repeat chest x-ray today with improved infiltrates, images personally reviewed  COVID-19 Labs  Recent Labs    09/26/19 0600  09/27/19 1200  DDIMER  --  2.79*  CRP 1.3*  --     Lab Results  Component Value Date   SARSCOV2NAA POSITIVE (A) 09/20/2019   Active Problems History of recurrent UTIs -Recently placed on nitrofurantoin and switch to Bactrim, during this hospital stay received meropenem for about 5 days, and also received fosfomycin x1  Presumed pulmonary embolism -Patient on admission was quite hypoxic and D-dimer was significantly elevated.  After discussion with the family, risks/benefits, and the fact that we cannot do a CT angiogram and patient is unable to tolerate a VQ scan, and in the setting of COVID-19 and poor mobility decision was made to anticoagulate for 3 months empirically to cover for potential PE.  This was discussed with the patient's daughter as well as the patient's grandson who is an MD  Acute kidney injury on chronic kidney disease stage IV  -Not clear baseline creatinine but in 2012 it was 1.9.  Her creatinine during this admission went as high as 4.5, currently stable, 1.74 this morning, resume home Demadex  Hyperkalemia -Improved  Hypothyroidism -Continue Synthroid  Hypotension with history of hypertension -BP now stable.  Antihypertensives on hold.  Chronic diastolic CHF -Resume home Demadex as kidney function is stable  "Vaginal" pain/lower pelvic pain -This appears to be new, daughter tells me that she has been complaining of that for the past several weeks and that is the main reason why she came to the hospital -CT scan of the abdomen and pelvis without acute findings.  In discussion with bedside RN she does have a rash in the area which is improving with local topical care.  Continue.  Scheduled Meds: . acidophilus  2 capsule Oral BID WC  . apixaban  5 mg Oral BID  . aspirin EC  81 mg Oral QPM  . Chlorhexidine Gluconate Cloth  6 each Topical Daily  . febuxostat  80 mg Oral Daily  . levothyroxine  75 mcg Oral Q0600  . mouth rinse  15 mL Mouth Rinse BID  .  methylPREDNISolone (SOLU-MEDROL) injection  60 mg Intravenous Q12H  . pantoprazole  40 mg Oral Daily  . senna-docusate  2 tablet Oral BID  . sodium chloride flush  10-40 mL Intracatheter Q12H  . sodium chloride flush  3 mL Intravenous Q12H  . traZODone  50 mg Oral QHS   Continuous Infusions: . sodium chloride Stopped (09/22/19 1719)   PRN Meds:.sodium chloride, acetaminophen **OR** acetaminophen, HYDROcodone-acetaminophen, LORazepam, ondansetron **OR** ondansetron (ZOFRAN) IV, sodium chloride flush  DVT prophylaxis: Therapeutic Lovenox Code Status: DNR Family Communication: Called daughter Eben Burow [phone], also called Dr. Romualdo Bolk [phone] Disposition Plan: May need SNF   Consultants:  None  Procedures:  None   Microbiology: Urine cultures without significant growth  Antimicrobials: Meropenem  Fosfomycin x1 1/22  Objective: Vitals:   09/26/19 1708 09/26/19 2300 09/27/19 0322 09/27/19 0752  BP: 138/62  (!) 144/59 (!) 148/62  Pulse: 97 80 91 98  Resp: 18 18 18 18   Temp: 98.7 F (37.1 C) 98.1 F (36.7 C) 98.5 F (36.9 C) 98.5 F (36.9 C)  TempSrc: Oral Oral Oral Oral  SpO2: 94% 98% 94% 97%  Weight:      Height:        Intake/Output Summary (Last 24 hours) at 09/27/2019 1346 Last data filed at 09/27/2019 0300 Gross per 24 hour  Intake 240 ml  Output 900 ml  Net -660 ml   Filed Weights   09/22/19 0500 09/24/19 0500 09/25/19 0500  Weight: 75.2 kg 76.7 kg 76.7 kg    Examination:  Constitutional: No distress, in bed Eyes: No scleral icterus ENMT: Moist mucous membranes Neck: normal, supple Respiratory: Shallow respirations but overall clear without wheezing or crackles Cardiovascular: Regular rate and rhythm, no murmurs, trace edema Abdomen: Soft, nontender, nondistended, positive bowel sounds Musculoskeletal: no clubbing / cyanosis.  Skin: No rashes seen Neurologic: no deficits   Data Reviewed: I have independently reviewed following  labs and imaging studies   CBC: Recent Labs  Lab 09/20/19 2121 09/20/19 2144 09/21/19 0543 09/21/19 0543 09/23/19 0400 09/24/19 0500 09/25/19 0055 09/26/19 0600 09/27/19 1200  WBC 10.6*   < > 7.8   < > 8.3 9.1 11.6* 11.7* 21.3*  NEUTROABS 9.9*  --  6.9  --   --   --   --   --   --   HGB 11.3*   < > 11.0*   < > 10.9* 11.2* 11.9* 11.0* 10.9*  HCT 35.6*   < > 35.8*   < > 34.1* 35.7* 36.9 34.0* 34.1*  MCV 96.0   < > 97.3   < > 96.3 95.7 94.9 95.8 95.5  PLT 304   < > 301   < > 275 311 312 328 340   < > = values in this interval not displayed.   Basic Metabolic Panel: Recent Labs  Lab 09/23/19 0400 09/24/19 0500 09/25/19 0055 09/26/19 0600 09/27/19 1200  NA 134* 135 138 138 138  K 5.6* 5.1 5.2* 4.7 5.4*  CL 106 103 105 107 108  CO2 21* 23 23 23 24   GLUCOSE 138* 163* 141* 130* 133*  BUN  74* 70* 83* 90* 84*  CREATININE 2.50* 1.96* 2.02* 1.91* 1.74*  CALCIUM 8.3* 8.4* 8.4* 8.3* 8.3*   GFR: Estimated Creatinine Clearance: 20.2 mL/min (A) (by C-G formula based on SCr of 1.74 mg/dL (H)). Liver Function Tests: Recent Labs  Lab 09/23/19 0400 09/24/19 0500 09/25/19 0055 09/26/19 0600 09/27/19 1200  AST 17 16 52* 49* 29  ALT 10 12 35 58* 46*  ALKPHOS 53 59 71 64 57  BILITOT 0.5 0.5 0.7 0.3 0.8  PROT 5.7* 5.9* 5.7* 5.3* 5.7*  ALBUMIN 2.4* 2.4* 2.4* 2.2* 2.4*   No results for input(s): LIPASE, AMYLASE in the last 168 hours. No results for input(s): AMMONIA in the last 168 hours. Coagulation Profile: No results for input(s): INR, PROTIME in the last 168 hours. Cardiac Enzymes: No results for input(s): CKTOTAL, CKMB, CKMBINDEX, TROPONINI in the last 168 hours. BNP (last 3 results) No results for input(s): PROBNP in the last 8760 hours. HbA1C: No results for input(s): HGBA1C in the last 72 hours. CBG: Recent Labs  Lab 09/21/19 1650 09/23/19 0729  GLUCAP 106* 127*   Lipid Profile: No results for input(s): CHOL, HDL, LDLCALC, TRIG, CHOLHDL, LDLDIRECT in the last 72  hours. Thyroid Function Tests: No results for input(s): TSH, T4TOTAL, FREET4, T3FREE, THYROIDAB in the last 72 hours. Anemia Panel: No results for input(s): VITAMINB12, FOLATE, FERRITIN, TIBC, IRON, RETICCTPCT in the last 72 hours. Urine analysis:    Component Value Date/Time   COLORURINE AMBER (A) 09/20/2019 2100   APPEARANCEUR HAZY (A) 09/20/2019 2100   LABSPEC 1.013 09/20/2019 2100   PHURINE 5.0 09/20/2019 2100   GLUCOSEU NEGATIVE 09/20/2019 2100   HGBUR NEGATIVE 09/20/2019 2100   [place] NEGATIVE 09/20/2019 2100   [place] NEGATIVE 09/20/2019 2100   PROTEINUR NEGATIVE 09/20/2019 2100   UROBILINOGEN 0.2 05/09/2011 1918   NITRITE NEGATIVE 09/20/2019 2100   LEUKOCYTESUR LARGE (A) 09/20/2019 2100   Sepsis Labs: Invalid input(s): PROCALCITONIN, LACTICIDVEN  Recent Results (from the past 240 hour(s))  Blood Culture (routine x 2)     Status: None   Collection Time: 09/20/19  8:30 PM   Specimen: BLOOD  Result Value Ref Range Status   Specimen Description   Final    BLOOD RIGHT ANTECUBITAL Performed at [facility] [address], [place], [place] [address]    Special Requests   Final    BOTTLES DRAWN AEROBIC AND ANAEROBIC Blood Culture results may not be optimal due to an excessive volume of blood received in culture bottles Performed at [facility] [address] [place], [place] [address]    Culture   Final    NO GROWTH 5 DAYS Performed at [facility], [place] [address], [place], [place] [address]    Report Status 09/26/2019 FINAL  Final  Urine culture     Status: Abnormal   Collection Time: 09/20/19  9:00 PM   Specimen: Urine, Clean Catch  Result Value Ref Range Status   Specimen Description   Final    URINE, CLEAN CATCH Performed at [facility], [place] [address] [place], [place] [address]    Special Requests   Final    NONE Performed at [facility], [place] [address],  [place], [place] [address]    Culture (A)  Final    <10,000 COLONIES/mL INSIGNIFICANT GROWTH Performed at [place] [address], [place], [place] [address]    Report Status 09/21/2019 FINAL  Final  SARS CORONAVIRUS 2 (TAT  6-24 HRS) Nasopharyngeal Nasopharyngeal Swab     Status: Abnormal   Collection Time: 09/20/19 10:44 PM   Specimen: Nasopharyngeal Swab  Result Value Ref Range Status   SARS Coronavirus 2 POSITIVE (A) NEGATIVE Final    Comment: RESULT CALLED TO, READ BACK BY AND VERIFIED WITH: H. COOK,RN 0630 09/21/2019 T. TYSOR (NOTE) SARS-CoV-2 target nucleic acids are DETECTED. The SARS-CoV-2 RNA is generally detectable in upper and lower respiratory specimens during the acute phase of infection. Positive results are indicative of the presence of SARS-CoV-2 RNA. Clinical correlation with patient history and other diagnostic information is  necessary to determine patient infection status. Positive results do not rule out bacterial infection or co-infection with other viruses.  The expected result is Negative. Fact Sheet for Patients: SugarRoll.be Fact Sheet for Healthcare Providers: https://www.woods-mathews.com/ This test is not yet approved or cleared by the [place] FDA and  has been authorized for detection and/or diagnosis of SARS-CoV-2 by FDA under an Emergency Use Authorization (EUA). This EUA will remain  in effect (meaning this test can be used) for the  duration of the COVID-19 declaration under Section 564(b)(1) of the Act, 21 U.S.C. section 360bbb-3(b)(1), unless the authorization is terminated or revoked sooner. Performed at [facility], [place] [address] [place], [place] [address]   MRSA PCR Screening     Status: None   Collection Time: 09/21/19  5:43 AM   Specimen: Nasal Mucosa; Nasopharyngeal  Result Value Ref Range Status   MRSA by PCR NEGATIVE NEGATIVE Final    Comment:        The GeneXpert MRSA  Assay (FDA approved for NASAL specimens only), is one component of a comprehensive MRSA colonization surveillance program. It is not intended to diagnose MRSA infection nor to guide or monitor treatment for MRSA infections. Performed at [facility], [place] [address], [place], [place] [address]   MRSA PCR Screening     Status: None   Collection Time: 09/21/19  4:47 PM   Specimen: Nasal Mucosa; Nasopharyngeal  Result Value Ref Range Status   MRSA by PCR NEGATIVE NEGATIVE Final    Comment:        The GeneXpert MRSA Assay (FDA approved for NASAL specimens only), is one component of a comprehensive MRSA colonization surveillance program. It is not intended to diagnose MRSA infection nor to guide or monitor treatment for MRSA infections. Performed at [facility], [place] [address], [place], [place] [address]   Blood Culture (routine x 2)     Status: None   Collection Time: 09/21/19  7:11 PM   Specimen: BLOOD LEFT HAND  Result Value Ref Range Status   Specimen Description   Final    BLOOD LEFT HAND Performed at [place] [address], [place], [place] [address]    Special Requests   Final    BOTTLES DRAWN AEROBIC AND ANAEROBIC Blood Culture adequate volume Performed at [place] [address], [place], [place] [address]    Culture   Final    NO GROWTH 5 DAYS Performed at [facility], [place] [address], [place], [place] [address]    Report Status 09/26/2019 FINAL  Final      Radiology Studies: DG CHEST PORT 1 VIEW  Result Date: 09/27/2019 CLINICAL DATA:  Hypoxemia, COVID-19 EXAM: PORTABLE CHEST 1 VIEW COMPARISON:  Portable exam 1046 hours compared to 09/20/2019 FINDINGS: Upper normal heart size. Atherosclerotic calcification aorta. Mediastinal contours and pulmonary vascularity  otherwise normal. Small LEFT pleural effusion with atelectasis versus infiltrate LEFT lower lobe, minimally  improved. Remaining lungs clear. No pneumothorax. Bones demineralized. IMPRESSION: Small LEFT pleural effusion with slightly improved infiltrate versus atelectasis LEFT lower lobe. Electronically Signed   By: Lavonia Dana M.D.   On: 09/27/2019 10:54    Marzetta Board, MD, PhD [facility]  Contact via  [email or web address]  [place] P: [phone] F: [phone]

## 2019-09-27 NOTE — Discharge Instructions (Signed)
Information on my medicine - ELIQUIS (apixaban)  This medication education was reviewed with me or my healthcare representative as part of my discharge preparation.  The pharmacist that spoke with me during my hospital stay was:  Onnie Boer, RPH-CPP  Why was Eliquis prescribed for you? Eliquis was prescribed to treat blood clots that may have been found in the veins of your legs (deep vein thrombosis) or in your lungs (pulmonary embolism) and to reduce the risk of them occurring again.  What do You need to know about Eliquis ? Take 5 mg TWICE daily.  Eliquis may be taken with or without food.   Try to take the dose about the same time in the morning and in the evening. If you have difficulty swallowing the tablet whole please discuss with your pharmacist how to take the medication safely.  Take Eliquis exactly as prescribed and DO NOT stop taking Eliquis without talking to the doctor who prescribed the medication.  Stopping may increase your risk of developing a new blood clot.  Refill your prescription before you run out.  After discharge, you should have regular check-up appointments with your healthcare provider that is prescribing your Eliquis.    What do you do if you miss a dose? If a dose of ELIQUIS is not taken at the scheduled time, take it as soon as possible on the same day and twice-daily administration should be resumed. The dose should not be doubled to make up for a missed dose.  Important Safety Information A possible side effect of Eliquis is bleeding. You should call your healthcare provider right away if you experience any of the following: ? Bleeding from an injury or your nose that does not stop. ? Unusual colored urine (red or dark brown) or unusual colored stools (red or black). ? Unusual bruising for unknown reasons. ? A serious fall or if you hit your head (even if there is no bleeding).  Some medicines may interact with Eliquis and might increase your  risk of bleeding or clotting while on Eliquis. To help avoid this, consult your healthcare provider or pharmacist prior to using any new prescription or non-prescription medications, including herbals, vitamins, non-steroidal anti-inflammatory drugs (NSAIDs) and supplements.  This website has more information on Eliquis (apixaban): http://www.eliquis.com/eliquis/home

## 2019-09-28 LAB — BASIC METABOLIC PANEL
Anion gap: 7 (ref 5–15)
BUN: 81 mg/dL — ABNORMAL HIGH (ref 8–23)
CO2: 25 mmol/L (ref 22–32)
Calcium: 8 mg/dL — ABNORMAL LOW (ref 8.9–10.3)
Chloride: 107 mmol/L (ref 98–111)
Creatinine, Ser: 1.73 mg/dL — ABNORMAL HIGH (ref 0.44–1.00)
GFR calc Af Amer: 29 mL/min — ABNORMAL LOW (ref >60)
GFR calc non Af Amer: 25 mL/min — ABNORMAL LOW (ref >60)
Glucose, Bld: 110 mg/dL — ABNORMAL HIGH (ref 70–99)
Potassium: 4.8 mmol/L (ref 3.5–5.1)
Sodium: 139 mmol/L (ref 135–145)

## 2019-09-28 MED ORDER — HYDROCODONE-ACETAMINOPHEN 5-325 MG PO TABS: 1.0000 | ORAL_TABLET | Freq: Three times a day (TID) | ORAL | 0 refills | Status: AC

## 2019-09-28 MED ORDER — TRAZODONE HCL 50 MG PO TABS: 50.0000 mg | ORAL_TABLET | Freq: Every day | ORAL | 0 refills | Status: AC

## 2019-09-28 MED ORDER — DEXAMETHASONE 6 MG PO TABS: 6.0000 mg | ORAL_TABLET | Freq: Every day | ORAL | 0 refills | Status: DC

## 2019-09-28 MED ORDER — APIXABAN 5 MG PO TABS: 5.0000 mg | ORAL_TABLET | Freq: Two times a day (BID) | ORAL | 0 refills | Status: DC

## 2019-09-28 MED ORDER — LORAZEPAM 0.5 MG PO TABS: 0.5000 mg | ORAL_TABLET | Freq: Every day | ORAL | 0 refills | Status: AC

## 2019-09-28 NOTE — TOC Transition Note (Signed)
Transition of Care [facility]) - CM/SW Discharge Note   Patient Details  Name: Krystal Mckenzie MRN: [id number] Date of Birth: 1926/12/13  [facility] CM/SW Contact:  Sheryl Towell, Chauncey Reading, RN Phone Number: 09/28/2019, 2:43 PM   Clinical Narrative:  Patient discharging to [facility] in [place] today.  Daughter updated. Bedside RN to call report at [phone] and arrange PTAR when ready. Patient will be going to [facility] section.     Final next level of care: Skilled Nursing Facility Barriers to Discharge: Barriers Resolved   Patient Goals and CMS Choice   CMS Medicare.gov Compare Post Acute Care list provided to:: Patient Represenative (must comment) Choice offered to / list presented to : Adult Children  Discharge Placement              Patient chooses bed at: Other - please specify in the comment section below:([facility]) Patient to be transferred to facility by: [facility] Name of family member notified: daughter Patient and family notified of of transfer: 09/28/19  Discharge Plan and Services In-house Referral: Clinical Social Work   Post Acute Care Choice: [place]                               Social Determinants of Health (SDOH) Interventions     Readmission Risk Interventions Readmission Risk Prevention Plan 09/25/2019  Transportation Screening Complete  HRI or [place] Not Complete  HRI or Home Care Consult comments Going to SNF  Social Work Consult for [place] Planning/Counseling [place] Not Applicable  Medication Review Press photographer) Complete  Some recent data might be hidden

## 2019-09-28 NOTE — Care Management Important Message (Signed)
Important Message  Patient Details  Name: [name] MRN: [id number] Date of Birth: 11/17/1926   Medicare Important Message Given:  Yes - Important Message mailed due to current National Emergency  Verbal consent obtained due to current National Emergency  Relationship to patient: Child Contact Name: Eben Burow Call Date: 09/28/19  Time: [id number] Phone: [phone] Outcome: No Answer/Busy Important Message mailed to: Patient address on file    Delorse Lek 09/28/2019, 3:47 PM

## 2019-09-28 NOTE — Discharge Summary (Signed)
Physician Discharge Summary  Krystal Mckenzie [id number] DOB: Aug 18, 1927 DOA: 09/20/2019  PCP: Patient, No Pcp Per  Admit date: 09/20/2019 Discharge date: 09/28/2019  Admitted From: ALF Disposition:  SNF  Recommendations for Outpatient Follow-up:  1. Follow up with PCP in 1-2 weeks 2. Continue Decadron for 3 additional days on discharge 3. Patient was started on Eliquis during this admission  Home Health: None Equipment/Devices: None  Discharge Condition: Stable CODE STATUS: DNR Diet recommendation: Regular  HPI: Per admitting MD, Krystal Mckenzie is a 86 y.o. female with medical history significant for chronic diastolic CHF, hypertension, hypothyroidism, chronic kidney disease, now presenting from her nursing facility for evaluation of malaise.  Patient was reportedly diagnosed with COVID-19 recently (more than a month ago per patient, less than a week ago per her daughter, and roughly 2 weeks ago per nursing home personnel), was started on 2 to 3 L of supplemental oxygen and steroids at the nursing home, was receiving antibiotics for a recurrent UTI, initially nitrofurantoin and then changed to [place] within the past 3 days.  Patient reports that her chronic leg swelling has recently resolved though she is having tenderness involving the bilateral legs.  She denies any chest pain, denies hemoptysis, and reports frequent cough that is only occasionally productive of scant sputum.  Suprapubic pain and dysuria has improved on the new antibiotic.  She was having diarrhea recently but reports that that has resolved. ED Course: Upon arrival to the ED, patient is found to be afebrile, saturating mid 90s on 3 L/min of supplemental oxygen, slightly tachypneic, and with blood pressure 100/30.  EKG features sinus rhythm with short PR interval.  Chest x-ray with bibasilar opacities, left greater than right, concerning for pneumonia, as well as hazy interstitial opacities that could reflect concomitant  edema.  Chemistry panel features a potassium of 5.9, bicarbonate 21, BUN 87, and creatinine 4.12.  Procalcitonin is elevated to 1.33, Covid antigen negative, and D-dimer 8.95.  CBC with mild leukocytosis and anemia.  Lactic acid reassuringly normal.  Blood and urine cultures were ordered, Covid PCR was ordered, and the patient was given a liter of normal saline, Norco, Lokelma, and meropenem in the ED.  ED physician discussed the case with nephrology who advised that patient is a poor dialysis candidate and should be treated conservatively at [facility] Course / Discharge diagnoses: Acute Hypoxic Respiratory Failure due to Covid-19 Viral Illness -Chest x-ray on admission showed bibasilar opacities worrisome for multifocal pneumonia in the setting of COVID-19.  Patient was started on remdesivir and completed 5 days while hospitalized.  She was also started on steroids for total of 10 days, received 7 days in the hospital and will need 3 additional days on discharge.  She also received convalescent plasma for her COVID-19.  On admission, given significantly elevated D-dimer as well as profound hypoxia she was started empirically on anticoagulation due to concern for PE. Physical therapy worked with patient and recommended skilled level care to which family is agreeable and she will be discharged to SNF.  She underwent a repeat chest x-ray prior to discharge which showed improved infiltrates.  Her hypoxia improved and she is on 2 L nasal cannula currently, please wean off to room air as tolerated at SNF.  COVID-19 Labs  Recent Labs    09/26/19 0600 09/27/19 1200  DDIMER  --  2.79*  CRP 1.3*  --     Lab Results  Component Value Date   SARSCOV2NAA POSITIVE (A)  09/20/2019   Active Problems History of recurrent UTIs -Recently placed on nitrofurantoin and switch to Bactrim, during this hospital stay received meropenem for about 5 days, and also received fosfomycin x1.   Afebrile Leukocytosis-this is likely due to steroid use, she is afebrile and otherwise no signs of active bacterial infection. Presumed pulmonary embolism -Patient on admission was quite hypoxic and D-dimer was significantly elevated.  After discussion with the family, risks/benefits, and the fact that we cannot do a CT angiogram and patient is unable to tolerate a VQ scan, and in the setting of COVID-19 and poor mobility decision was made to anticoagulate for 3 months empirically to cover for potential PE.  This was discussed with the patient's daughter as well as the patient's grandson who is an MD Acute kidney injury on chronic kidney disease stage IV  -Not clear baseline creatinine but in 2012 it was 1.9.  Her creatinine during this admission went as high as 4.5, currently stable, 1.73 this morning, stable on her home Demadex which was resumed prior to discharge Hyperkalemia -Improved Hypothyroidism -Continue Synthroid Hypotension with history of hypertension -resume home medications  Chronic diastolic CHF -Resume home Demadex as kidney function is stable "Vaginal" pain/lower pelvic pain -This appears to be new, daughter tells me that she has been complaining of that for the past several weeks. CT scan of the abdomen and pelvis without acute findings.  In discussion with bedside RN she does have a rash in the area which is improving with local topical care.  Continue.   Discharge Instructions   Allergies as of 09/28/2019      Reactions   Morphine And Related    Cephalexin Hives, Swelling, Rash   Penicillins Swelling, Rash   Did it involve swelling of the face/tongue/throat, SOB, or low BP? Unknown Did it involve sudden or severe rash/hives, skin peeling, or any reaction on the inside of your mouth or nose? Unknown Did you need to seek medical attention at a hospital or doctor's office? Unknown When did it last happen? Unknown If all above answers are "NO", may proceed with cephalosporin  use.      Medication List    STOP taking these medications   ascorbic acid 500 MG tablet Commonly known as: VITAMIN C   aspirin EC 81 MG tablet   b complex vitamins tablet   Os-Cal 500 + D 500-200 MG-UNIT tablet Generic drug: calcium-vitamin D   predniSONE 50 MG tablet Commonly known as: DELTASONE   zinc gluconate 50 MG tablet     TAKE these medications   acetaminophen 325 MG tablet Commonly known as: TYLENOL Take 650 mg by mouth every 4 (four) hours as needed for fever.   amLODipine 5 MG tablet Commonly known as: NORVASC Take 5 mg by mouth daily.   apixaban 5 MG Tabs tablet Commonly known as: ELIQUIS Take 1 tablet (5 mg total) by mouth 2 (two) times daily.   AZO-CRANBERRY PO Take 1 tablet by mouth 2 (two) times daily. AZO cranberry + Probiotic 250mg -30mg -50 million cell   clindamycin 150 MG capsule Commonly known as: CLEOCIN Take 600 mg by mouth See admin instructions. 600 mg PO x1 one hour prior to all dental appointments   dexamethasone 6 MG tablet Commonly known as: DECADRON Take 1 tablet (6 mg total) by mouth daily.   Febuxostat 80 MG Tabs Take 80 mg by mouth daily.   hydrALAZINE 25 MG tablet Commonly known as: APRESOLINE Take 25 mg by mouth 3 (three) times daily.  HYDROcodone-acetaminophen 5-325 MG tablet Commonly known as: NORCO/VICODIN Take 1 tablet by mouth 3 (three) times daily.   isosorbide dinitrate 20 MG tablet Commonly known as: ISORDIL Take 20 mg by mouth daily. Midday (1330)   isosorbide dinitrate 20 MG tablet Commonly known as: ISORDIL Take 40 mg by mouth 2 (two) times daily. 800 and 2130   lactobacillus acidophilus Tabs tablet Take 2 tablets by mouth 2 (two) times daily with a meal.   levothyroxine 75 MCG tablet Commonly known as: SYNTHROID Take 75 mcg by mouth daily.   LORazepam 0.5 MG tablet Commonly known as: ATIVAN Take 1 tablet (0.5 mg total) by mouth daily.   metoprolol succinate 25 MG 24 hr tablet Commonly known  as: TOPROL-XL Take 25 mg by mouth daily.   multivitamin-iron-minerals-folic acid chewable tablet Chew 1 tablet by mouth daily.   neomycin-polymyxin b-dexamethasone 3.5-10000-0.1 Oint Commonly known as: MAXITROL Place 1 application into the right eye at bedtime as needed (eye swelling, eye infection).   omeprazole 20 MG capsule Commonly known as: PRILOSEC Take 20 mg by mouth daily.   polyethylene glycol 17 g packet Commonly known as: MIRALAX / GLYCOLAX Take 17 g by mouth daily as needed.   potassium chloride 10 MEQ tablet Commonly known as: KLOR-CON Take 10 mEq by mouth daily.   Preparation H 0.25-14-74.9 % rectal ointment Generic drug: phenylephrine-shark liver oil-mineral oil-petrolatum Place 1 application rectally 4 (four) times daily as needed for hemorrhoids.   Senna S 8.6-50 MG tablet Generic drug: senna-docusate Take 1 tablet by mouth 2 (two) times daily.   torsemide 10 MG tablet Commonly known as: DEMADEX Take 10 mg by mouth See admin instructions. 10 mg PO five times weekly (Tuesdays, Wednesdays, Fridays, Saturdays, and Sundays)   torsemide 20 MG tablet Commonly known as: DEMADEX Take 20 mg by mouth 2 (two) times a week. On Mondays and Thursdays   traZODone 50 MG tablet Commonly known as: DESYREL Take 1 tablet (50 mg total) by mouth at bedtime.   Vitamin D3 50 MCG (2000 UT) Tabs Take 50 mcg by mouth daily.       Consultations:  None   Procedures/Studies:  CT ABDOMEN PELVIS WO CONTRAST  Result Date: 09/24/2019 CLINICAL DATA:  Lower abdominal and pelvic pain. EXAM: CT ABDOMEN AND PELVIS WITHOUT CONTRAST TECHNIQUE: Multidetector CT imaging of the abdomen and pelvis was performed following the standard protocol without IV contrast. COMPARISON:  CT scan 05/10/2011 FINDINGS: Exam significantly limited by patient motion. Lower chest: Small bilateral pleural effusions and bibasilar infiltrates. The heart is mildly enlarged. Stable vascular calcifications.  Hepatobiliary: No focal hepatic lesions or intrahepatic biliary dilatation. Gallbladder is grossly normal. No obvious common bile duct dilatation. Pancreas: Very poorly visualized due to motion artifact. No gross abnormality. Spleen: Normal size. Adrenals/Urinary Tract: No obvious renal abnormality. No hydronephrosis or renal calculi. The left kidney is small. The bladder is grossly normal. Mild bladder distention. No bladder mass or calculi. Stomach/Bowel: Stomach, duodenum, small bowel and colon are grossly normal but exam quite limited by motion artifact. No obvious acute obstructive findings or mass lesions. Colonic diverticulosis without definite findings for acute diverticulitis. Vascular/Lymphatic: Advanced vascular calcifications. No definite aneurysm. No mesenteric or retroperitoneal mass or adenopathy is identified. Reproductive: The uterus and ovaries are unremarkable. Other: No pelvic mass or pelvic lymphadenopathy. No free pelvic fluid collections. No inguinal mass. Small left inguinal hernia containing fat. Diffuse bulging anterior abdominal wall but no discrete hernia. Musculoskeletal: Scoliosis and degenerative lumbar spondylosis. Remote vertebral augmentation changes  noted at T12. No acute bony findings or destructive bony changes. IMPRESSION: 1. Very limited examination due to motion artifact. 2. Small bilateral pleural effusions and bibasilar infiltrates. 3. No obvious acute abdominal/pelvic findings, mass lesions or adenopathy. Electronically Signed   By: Marijo Sanes M.D.   On: 09/24/2019 17:02   US RENAL  Result Date: 09/22/2019 CLINICAL DATA:  Acute renal failure EXAM: RENAL / URINARY TRACT ULTRASOUND COMPLETE COMPARISON:  CT 05/10/2011 FINDINGS: Right Kidney: Renal measurements: 8.2 x 3.8 x 3.9 cm = volume: 64.3 mL. Echogenic cortex. No mass or hydronephrosis Left Kidney: Renal measurements: 7.8 x 3.3 x 3.4 cm = volume: 45.3 mL. Echogenic cortex. No mass or hydronephrosis. Bladder:  Appears normal for degree of bladder distention. Other: None. IMPRESSION: Small echogenic kidneys consistent with medical renal disease and mild atrophy. No hydronephrosis. Electronically Signed   By: Donavan Foil M.D.   On: 09/22/2019 19:19   DG CHEST PORT 1 VIEW  Result Date: 09/27/2019 CLINICAL DATA:  Hypoxemia, COVID-19 EXAM: PORTABLE CHEST 1 VIEW COMPARISON:  Portable exam 1046 hours compared to 09/20/2019 FINDINGS: Upper normal heart size. Atherosclerotic calcification aorta. Mediastinal contours and pulmonary vascularity otherwise normal. Small LEFT pleural effusion with atelectasis versus infiltrate LEFT lower lobe, minimally improved. Remaining lungs clear. No pneumothorax. Bones demineralized. IMPRESSION: Small LEFT pleural effusion with slightly improved infiltrate versus atelectasis LEFT lower lobe. Electronically Signed   By: Lavonia Dana M.D.   On: 09/27/2019 10:54   DG Chest Port 1 View  Result Date: 09/20/2019 CLINICAL DATA:  Shortness of breath, COVID-19 positive EXAM: PORTABLE CHEST 1 VIEW COMPARISON:  Radiograph 05/09/2011 FINDINGS: Patchy right basilar and dense retrocardiac opacities. Obscuration of left hemidiaphragm may reflect basilar consolidation or effusion. No visible pneumothorax. Biapical pleuroparenchymal scarring is seen. There is some cephalized vascularity with hazy interstitial opacities suggesting pulmonary edema. Tracheal tortuosity is similar to prior. The aorta is calcified. The remaining cardiomediastinal contours are unremarkable. No acute osseous or soft tissue abnormality. Degenerative changes are present in the imaged spine and shoulders. Telemetry leads and support devices overlie the chest. IMPRESSION: 1. Bibasilar opacities, left greater than right, worrisome for pneumonia in the setting of COVID-19. 2. Pulmonary vascular congestion and hazy interstitial opacities suggesting some concomitant edema. 3. Biapical pleuroparenchymal scarring. 4.  Aortic  Atherosclerosis (ICD10-I70.0). Electronically Signed   By: Lovena Le M.D.   On: 09/20/2019 21:06   VAS [place] LOWER EXTREMITY VENOUS (DVT)  Result Date: 09/22/2019  Lower Venous Study Indications: Pain.  Risk Factors: COVID 19 positive. Limitations: Poor ultrasound/tissue interface and patient pain tolerance, patient immobility, patient positioning. Comparison Study: No prior studies. Performing Technologist: Oliver Hum RVT  Examination Guidelines: A complete evaluation includes B-mode imaging, spectral Doppler, color Doppler, and power Doppler as needed of all accessible portions of each vessel. Bilateral testing is considered an integral part of a complete examination. Limited examinations for reoccurring indications may be performed as noted.  +---------+---------------+---------+-----------+----------+--------------+ RIGHT    CompressibilityPhasicitySpontaneityPropertiesThrombus Aging +---------+---------------+---------+-----------+----------+--------------+ CFV      Full           Yes      Yes                                 +---------+---------------+---------+-----------+----------+--------------+ FV Prox  Full                                                        +---------+---------------+---------+-----------+----------+--------------+  FV Mid                  Yes      Yes                                 +---------+---------------+---------+-----------+----------+--------------+ FV Distal               Yes      Yes                                 +---------+---------------+---------+-----------+----------+--------------+ POP      Full           Yes      Yes                                 +---------+---------------+---------+-----------+----------+--------------+ PTV      Full                                                        +---------+---------------+---------+-----------+----------+--------------+ PERO                                                   Not visualized +---------+---------------+---------+-----------+----------+--------------+   +---------+---------------+---------+-----------+----------+--------------+ LEFT     CompressibilityPhasicitySpontaneityPropertiesThrombus Aging +---------+---------------+---------+-----------+----------+--------------+ CFV                     Yes      Yes                                 +---------+---------------+---------+-----------+----------+--------------+ FV Prox                 Yes      Yes                                 +---------+---------------+---------+-----------+----------+--------------+ FV Mid                  Yes      Yes                                 +---------+---------------+---------+-----------+----------+--------------+ FV Distal                                             Not visualized +---------+---------------+---------+-----------+----------+--------------+ POP                                                   Not visualized +---------+---------------+---------+-----------+----------+--------------+ PTV  Not visualized +---------+---------------+---------+-----------+----------+--------------+ PERO                                                  Not visualized +---------+---------------+---------+-----------+----------+--------------+     Summary: Right: There is no evidence of deep vein thrombosis in the lower extremity. However, portions of this examination were limited- see technologist comments above. No cystic structure found in the popliteal fossa. Left: There is no evidence of deep vein thrombosis in the lower extremity. However, portions of this examination were limited- see technologist comments above. No cystic structure found in the popliteal fossa.  *See table(s) above for measurements and observations. Electronically signed by Deitra Mayo MD on  09/22/2019 at 12:53:25 PM.    Final      Subjective: -Complains of being cold, thermostat is on 50 degrees.  No other complaints  Discharge Exam: BP (!) 143/54 (BP Location: Right Arm)   Pulse 79   Temp 98.1 F (36.7 C) (Oral)   Resp 20   Ht 5\' 2"  (1.575 m)   Wt 76.7 kg Comment: wt questionable,unable to obtain correctly, see note  SpO2 94%   BMI 30.92 kg/m   General: Pt is alert, awake, not in acute distress Cardiovascular: RRR, S1/S2 +, no rubs, no gallops Respiratory: CTA bilaterally, no wheezing, no rhonchi Abdominal: Soft, NT, ND, bowel sounds + Extremities: no edema, no cyanosis    The results of significant diagnostics from this hospitalization (including imaging, microbiology, ancillary and laboratory) are listed below for reference.     Microbiology: Recent Results (from the past 240 hour(s))  Blood Culture (routine x 2)     Status: None   Collection Time: 09/20/19  8:30 PM   Specimen: BLOOD  Result Value Ref Range Status   Specimen Description   Final    BLOOD RIGHT ANTECUBITAL Performed at [facility] [address] [place], [place] [address]    Special Requests   Final    BOTTLES DRAWN AEROBIC AND ANAEROBIC Blood Culture results may not be optimal due to an excessive volume of blood received in culture bottles Performed at [facility] [address], [place], [place] [address]    Culture   Final    NO GROWTH 5 DAYS Performed at [facility], [place] [address] [place], [place] [address]    Report Status 09/26/2019 FINAL  Final  Urine culture     Status: Abnormal   Collection Time: 09/20/19  9:00 PM   Specimen: Urine, Clean Catch  Result Value Ref Range Status   Specimen Description   Final    URINE, CLEAN CATCH Performed at [facility], [place] [address] [place], [place] [address]    Special Requests   Final    NONE Performed at [facility], [place] [address],  [place], [place] [address]    Culture (A)  Final    <10,000 COLONIES/mL INSIGNIFICANT GROWTH Performed at [facility] [address] [place],  [address]    Report Status 09/21/2019 FINAL  Final  SARS CORONAVIRUS 2 (TAT 6-24 HRS) Nasopharyngeal Nasopharyngeal Swab     Status: Abnormal   Collection Time: 09/20/19 10:44 PM   Specimen: Nasopharyngeal Swab  Result Value Ref Range Status   SARS Coronavirus 2 POSITIVE (A) NEGATIVE Final    Comment: RESULT CALLED TO,  READ BACK BY AND VERIFIED WITH: H. COOK,RN 0630 09/21/2019 T. TYSOR (NOTE) SARS-CoV-2 target nucleic acids are DETECTED. The SARS-CoV-2 RNA is generally detectable in upper and lower respiratory specimens during the acute phase of infection. Positive results are indicative of the presence of SARS-CoV-2 RNA. Clinical correlation with patient history and other diagnostic information is  necessary to determine patient infection status. Positive results do not rule out bacterial infection or co-infection with other viruses.  The expected result is Negative. Fact Sheet for Patients: SugarRoll.be Fact Sheet for Healthcare Providers: https://www.woods-mathews.com/ This test is not yet approved or cleared by the [place] FDA and  has been authorized for detection and/or diagnosis of SARS-CoV-2 by FDA under an Emergency Use Authorization (EUA). This EUA will remain  in effect (meaning this test can be used) for the  duration of the COVID-19 declaration under Section 564(b)(1) of the Act, 21 U.S.C. section 360bbb-3(b)(1), unless the authorization is terminated or revoked sooner. Performed at [facility], [place] [address] [place], [place] [address]   MRSA PCR Screening     Status: None   Collection Time: 09/21/19  5:43 AM   Specimen: Nasal Mucosa; Nasopharyngeal  Result Value Ref Range Status   MRSA by PCR NEGATIVE NEGATIVE Final    Comment:        The GeneXpert MRSA  Assay (FDA approved for NASAL specimens only), is one component of a comprehensive MRSA colonization surveillance program. It is not intended to diagnose MRSA infection nor to guide or monitor treatment for MRSA infections. Performed at [facility], [place] [address], [place], [place] [address]   MRSA PCR Screening     Status: None   Collection Time: 09/21/19  4:47 PM   Specimen: Nasal Mucosa; Nasopharyngeal  Result Value Ref Range Status   MRSA by PCR NEGATIVE NEGATIVE Final    Comment:        The GeneXpert MRSA Assay (FDA approved for NASAL specimens only), is one component of a comprehensive MRSA colonization surveillance program. It is not intended to diagnose MRSA infection nor to guide or monitor treatment for MRSA infections. Performed at [facility], [place] [address], [place], [place] [address]   Blood Culture (routine x 2)     Status: None   Collection Time: 09/21/19  7:11 PM   Specimen: BLOOD LEFT HAND  Result Value Ref Range Status   Specimen Description   Final    BLOOD LEFT HAND Performed at [facility] [address], [place], [place] [address]    Special Requests   Final    BOTTLES DRAWN AEROBIC AND ANAEROBIC Blood Culture adequate volume Performed at [facility] [address] [place], [place] [address]    Culture   Final    NO GROWTH 5 DAYS Performed at [facility], [place] [address] [place], [place] [address]    Report Status 09/26/2019 FINAL  Final     Labs: Basic Metabolic Panel: Recent Labs  Lab 09/24/19 0500 09/25/19 0055 09/26/19 0600 09/27/19 1200 09/28/19 0650  NA 135 138 138 138 139  K 5.1 5.2* 4.7 5.4* 4.8  CL 103 105 107 108 107  CO2 23 23 23 24 25   GLUCOSE 163* 141* 130* 133* 110*  BUN 70* 83* 90* 84* 81*  CREATININE 1.96* 2.02* 1.91* 1.74* 1.73*  CALCIUM 8.4* 8.4* 8.3* 8.3* 8.0*   Liver Function Tests: Recent Labs  Lab 09/23/19 0400  09/24/19 0500 09/25/19  0055 09/26/19 0600 09/27/19 1200  AST 17 16 52* 49* 29  ALT 10 12 35 58* 46*  ALKPHOS 53 59 71 64 57  BILITOT 0.5 0.5 0.7 0.3 0.8  PROT 5.7* 5.9* 5.7* 5.3* 5.7*  ALBUMIN 2.4* 2.4* 2.4* 2.2* 2.4*   CBC: Recent Labs  Lab 09/23/19 0400 09/24/19 0500 09/25/19 0055 09/26/19 0600 09/27/19 1200  WBC 8.3 9.1 11.6* 11.7* 21.3*  HGB 10.9* 11.2* 11.9* 11.0* 10.9*  HCT 34.1* 35.7* 36.9 34.0* 34.1*  MCV 96.3 95.7 94.9 95.8 95.5  PLT 275 311 312 328 340   CBG: Recent Labs  Lab 09/21/19 1650 09/23/19 0729  GLUCAP 106* 127*   Hgb A1c No results for input(s): HGBA1C in the last 72 hours. Lipid Profile No results for input(s): CHOL, HDL, LDLCALC, TRIG, CHOLHDL, LDLDIRECT in the last 72 hours. Thyroid function studies No results for input(s): TSH, T4TOTAL, T3FREE, THYROIDAB in the last 72 hours.  Invalid input(s): FREET3 Urinalysis    Component Value Date/Time   COLORURINE AMBER (A) 09/20/2019 2100   APPEARANCEUR HAZY (A) 09/20/2019 2100   LABSPEC 1.013 09/20/2019 2100   PHURINE 5.0 09/20/2019 2100   GLUCOSEU NEGATIVE 09/20/2019 2100   HGBUR NEGATIVE 09/20/2019 2100   [place] NEGATIVE 09/20/2019 2100   KETONESUR NEGATIVE 09/20/2019 2100   PROTEINUR NEGATIVE 09/20/2019 2100   UROBILINOGEN 0.2 05/09/2011 1918   NITRITE NEGATIVE 09/20/2019 2100   LEUKOCYTESUR LARGE (A) 09/20/2019 2100    FURTHER DISCHARGE INSTRUCTIONS:   Get Medicines reviewed and adjusted: Please take all your medications with you for your next visit with your Primary MD   Laboratory/radiological data: Please request your Primary MD to go over all hospital tests and procedure/radiological results at the follow up, please ask your Primary MD to get all Hospital records sent to his/her office.   In some cases, they will be blood work, cultures and biopsy results pending at the time of your discharge. Please request that your primary care M.D. goes through all the records of your  hospital data and follows up on these results.   Also Note the following: If you experience worsening of your admission symptoms, develop shortness of breath, life threatening emergency, suicidal or homicidal thoughts you must seek medical attention immediately by calling 911 or calling your MD immediately  if symptoms less severe.   You must read complete instructions/literature along with all the possible adverse reactions/side effects for all the Medicines you take and that have been prescribed to you. Take any new Medicines after you have completely understood and accpet all the possible adverse reactions/side effects.    Do not drive when taking Pain medications or sleeping medications (Benzodaizepines)   Do not take more than prescribed Pain, Sleep and Anxiety Medications. It is not advisable to combine anxiety,sleep and pain medications without talking with your primary care practitioner   Special Instructions: If you have smoked or chewed Tobacco  in the last 2 yrs please stop smoking, stop any regular Alcohol  and or any Recreational drug use.   Wear Seat belts while driving.   Please note: You were cared for by a hospitalist during your hospital stay. Once you are discharged, your primary care physician will handle any further medical issues. Please note that NO REFILLS for any discharge medications will be authorized once you are discharged, as it is imperative that you return to your primary care physician (or establish a relationship with a primary care physician if you do not have one) for your  post hospital discharge needs so that they can reassess your need for medications and monitor your lab values.  Time coordinating discharge: 40 minutes  SIGNED:  Marzetta Board, MD, PhD 09/28/2019, 11:10 AM

## 2019-10-05 ENCOUNTER — Inpatient Hospital Stay (HOSPITAL_COMMUNITY)
Admission: EM | Admit: 2019-10-05 | Discharge: 2019-10-08 | DRG: 388 | Disposition: A | Discharge status: Skilled Nursing Facility | Payer: Medicare Other | Attending: Internal Medicine | Admitting: Internal Medicine

## 2019-10-05 ENCOUNTER — Other Ambulatory Visit: Payer: Self-pay

## 2019-10-05 ENCOUNTER — Encounter (HOSPITAL_COMMUNITY): Payer: Self-pay | Admitting: *Deleted

## 2019-10-05 DIAGNOSIS — Z79899 Other long term (current) drug therapy: Secondary | ICD-10-CM

## 2019-10-05 DIAGNOSIS — N183 Chronic kidney disease, stage 3 unspecified: Secondary | ICD-10-CM | POA: Diagnosis present

## 2019-10-05 DIAGNOSIS — K5641 Fecal impaction: Principal | ICD-10-CM

## 2019-10-05 DIAGNOSIS — N184 Chronic kidney disease, stage 4 (severe): Secondary | ICD-10-CM | POA: Diagnosis present

## 2019-10-05 DIAGNOSIS — I130 Hypertensive heart and chronic kidney disease with heart failure and stage 1 through stage 4 chronic kidney disease, or unspecified chronic kidney disease: Secondary | ICD-10-CM | POA: Diagnosis present

## 2019-10-05 DIAGNOSIS — Z888 Allergy status to other drugs, medicaments and biological substances status: Secondary | ICD-10-CM

## 2019-10-05 DIAGNOSIS — D649 Anemia, unspecified: Secondary | ICD-10-CM

## 2019-10-05 DIAGNOSIS — U071 COVID-19: Secondary | ICD-10-CM | POA: Diagnosis present

## 2019-10-05 DIAGNOSIS — B965 Pseudomonas (aeruginosa) (mallei) (pseudomallei) as the cause of diseases classified elsewhere: Secondary | ICD-10-CM | POA: Diagnosis present

## 2019-10-05 DIAGNOSIS — Z885 Allergy status to narcotic agent status: Secondary | ICD-10-CM

## 2019-10-05 DIAGNOSIS — Z96652 Presence of left artificial knee joint: Secondary | ICD-10-CM | POA: Diagnosis present

## 2019-10-05 DIAGNOSIS — R52 Pain, unspecified: Secondary | ICD-10-CM

## 2019-10-05 DIAGNOSIS — E039 Hypothyroidism, unspecified: Secondary | ICD-10-CM | POA: Diagnosis present

## 2019-10-05 DIAGNOSIS — K219 Gastro-esophageal reflux disease without esophagitis: Secondary | ICD-10-CM | POA: Diagnosis present

## 2019-10-05 DIAGNOSIS — D631 Anemia in chronic kidney disease: Secondary | ICD-10-CM | POA: Diagnosis present

## 2019-10-05 DIAGNOSIS — N390 Urinary tract infection, site not specified: Secondary | ICD-10-CM

## 2019-10-05 DIAGNOSIS — Z7901 Long term (current) use of anticoagulants: Secondary | ICD-10-CM

## 2019-10-05 DIAGNOSIS — Z79891 Long term (current) use of opiate analgesic: Secondary | ICD-10-CM

## 2019-10-05 DIAGNOSIS — Z880 Allergy status to penicillin: Secondary | ICD-10-CM

## 2019-10-05 DIAGNOSIS — Z8619 Personal history of other infectious and parasitic diseases: Secondary | ICD-10-CM

## 2019-10-05 DIAGNOSIS — Z66 Do not resuscitate: Secondary | ICD-10-CM | POA: Diagnosis present

## 2019-10-05 DIAGNOSIS — Z79890 Hormone replacement therapy (postmenopausal): Secondary | ICD-10-CM

## 2019-10-05 DIAGNOSIS — F0390 Unspecified dementia without behavioral disturbance: Secondary | ICD-10-CM | POA: Diagnosis present

## 2019-10-05 DIAGNOSIS — R1084 Generalized abdominal pain: Secondary | ICD-10-CM

## 2019-10-05 DIAGNOSIS — I5032 Chronic diastolic (congestive) heart failure: Secondary | ICD-10-CM | POA: Diagnosis present

## 2019-10-05 HISTORY — DX: Heart failure, unspecified: I50.9

## 2019-10-05 HISTORY — DX: Disorder of kidney and ureter, unspecified: N28.9

## 2019-10-05 HISTORY — DX: Gastro-esophageal reflux disease without esophagitis: K21.9

## 2019-10-05 MED ORDER — IOHEXOL 9 MG/ML PO SOLN: 500.0000 mL | ORAL | Status: AC

## 2019-10-05 MED ORDER — FENTANYL CITRATE (PF) 100 MCG/2ML IJ SOLN
25.0000 ug | Freq: Once | INTRAMUSCULAR | Status: AC
  Administered 2019-10-06: 01:00:00 25 ug via INTRAVENOUS
  Filled 2019-10-05: qty 2

## 2019-10-05 MED ORDER — IOHEXOL 9 MG/ML PO SOLN
ORAL | Status: AC
  Filled 2019-10-05: qty 500

## 2019-10-05 NOTE — ED Triage Notes (Signed)
Pt BIB EMS and coming from [facility] at [facility].  For the past 5 days, pt has not had a BM.  Pt's abd is distended and is tender to the touch.  Abd X-rays were taken at the facility which reveal impaction.  Pt has been given miralax and mag citrate with no relief.  Pt is on 3L [place] normally and pt was positive for COVID last month.  Pt does have some baseline confusion and is only oriented to self.

## 2019-10-05 NOTE — ED Provider Notes (Signed)
[facility] DEPT Provider Note   CSN: [id number] Arrival date & time: 10/05/19  2151     History Chief Complaint  Patient presents with  . Constipation    Krystal Mckenzie is a 86 y.o. female.  The history is provided by the patient, the nursing home and medical records. No language interpreter was used.  Constipation   Krystal Mckenzie is a 86 y.o. female who presents to the Emergency Department complaining of constipation. Level V caveat due to dementia. She presents from [facility] for evaluation of progressive abdominal pain and bloating. She is not had documented BM since January 27. She was treated with MiraLAX, lactulose and a suppository the facility with no improvement in her symptoms. She does complain of nausea. KUB was obtained at the facility that showed a possible ileus and she was transferred to the emergency department for further evaluation. She complains of severe abdominal pain from her Rollene Fare to her umbilicus. She is unable to state how long the pain has been going on. No reports of trauma. She denies any fevers, vomiting. Symptoms are severe and constant nature.     Past Medical History:  Diagnosis Date  . CHF (congestive heart failure) ([place])   . GERD (gastroesophageal reflux disease)   . Hypertension   . Hypothyroid   . Renal disorder    Stage 4    Patient Active Problem List   Diagnosis Date Noted  . Acute respiratory failure due to COVID-19 ([place]) 09/21/2019  . Hyperkalemia 09/20/2019  . Acute renal failure superimposed on chronic kidney disease ([place]) 09/20/2019  . Pneumonia 09/20/2019  . Recurrent UTI 09/20/2019  . Hypothyroid   . Acute respiratory failure with hypoxia ([place])   . Hypertension   . Chronic diastolic CHF (congestive heart failure) ([place])   . Positive D dimer     Past Surgical History:  Procedure Laterality Date  . JOINT REPLACEMENT     Left knee  . REPLACEMENT TOTAL KNEE       OB  History   No obstetric history on file.     No family history on file.  Social History   Tobacco Use  . Smoking status: Never Smoker  . Smokeless tobacco: Never Used  Substance Use Topics  . Alcohol use: Never  . Drug use: Never    Home Medications Prior to Admission medications   Medication Sig Start Date End Date Taking? Authorizing Provider  acetaminophen (TYLENOL) 325 MG tablet Take 650 mg by mouth every 4 (four) hours as needed for fever.   Yes [provider]  amLODipine (NORVASC) 5 MG tablet Take 5 mg by mouth daily. 09/15/19  Yes [provider]  apixaban (ELIQUIS) 5 MG TABS tablet Take 1 tablet (5 mg total) by mouth 2 (two) times daily. 09/28/19  Yes Gherghe, Vella Redhead, MD  AZO-CRANBERRY PO Take 1 tablet by mouth 2 (two) times daily. AZO cranberry + Probiotic 250mg -30mg -50 million cell   Yes [provider]  bisacodyl (DULCOLAX) 10 MG suppository Place 10 mg rectally every 12 (twelve) hours as needed for moderate constipation. x3days started 2.1.21   Yes [provider]  Cholecalciferol (VITAMIN D3) 50 MCG (2000 UT) TABS Take 50 mcg by mouth daily.   Yes [provider]  clindamycin (CLEOCIN) 150 MG capsule Take 600 mg by mouth See admin instructions. 600 mg PO x1 one hour prior to all dental appointments   Yes [provider]  dexamethasone (DECADRON) 6 MG  tablet Take 1 tablet (6 mg total) by mouth daily. 09/28/19  Yes Caren Griffins, MD  Febuxostat 80 MG TABS Take 80 mg by mouth daily. 09/15/19  Yes [provider]  hydrALAZINE (APRESOLINE) 25 MG tablet Take 25 mg by mouth 3 (three) times daily.   Yes [provider]  HYDROcodone-acetaminophen (NORCO/VICODIN) 5-325 MG tablet Take 1 tablet by mouth 3 (three) times daily. 09/28/19  Yes Gherghe, Vella Redhead, MD  isosorbide dinitrate (ISORDIL) 20 MG tablet Take 40 mg by mouth 2 (two) times daily. 800 and 2130 08/07/19  Yes [provider]  isosorbide  dinitrate (ISORDIL) 20 MG tablet Take 20 mg by mouth daily. Midday (1330) 05/20/19  Yes [provider]  lactobacillus acidophilus (BACID) TABS tablet Take 2 tablets by mouth 2 (two) times daily with a meal.   Yes [provider]  levothyroxine (SYNTHROID, LEVOTHROID) 75 MCG tablet Take 75 mcg by mouth daily.     Yes [provider]  LORazepam (ATIVAN) 0.5 MG tablet Take 1 tablet (0.5 mg total) by mouth daily. 09/28/19  Yes Caren Griffins, MD  metoprolol succinate (TOPROL-XL) 25 MG 24 hr tablet Take 25 mg by mouth daily.   Yes [provider]  multivitamin-iron-minerals-folic acid (CENTRUM) chewable tablet Chew 1 tablet by mouth daily.   Yes [provider]  neomycin-polymyxin b-dexamethasone (MAXITROL) 3.5-10000-0.1 OINT Place 1 application into the right eye at bedtime as needed (eye swelling, eye infection).   Yes [provider]  omeprazole (PRILOSEC) 20 MG capsule Take 20 mg by mouth daily. 08/17/19  Yes [provider]  ondansetron (ZOFRAN-ODT) 4 MG disintegrating tablet Take 4 mg by mouth every 8 (eight) hours as needed for nausea or vomiting.   Yes [provider]  phenylephrine-shark liver oil-mineral oil-petrolatum (PREPARATION H) 0.25-14-74.9 % rectal ointment Place 1 application rectally 4 (four) times daily as needed for hemorrhoids.   Yes [provider]  polyethylene glycol (MIRALAX / GLYCOLAX) 17 g packet Take 17 g by mouth daily as needed.   Yes [provider]  potassium chloride (KLOR-CON) 10 MEQ tablet Take 10 mEq by mouth daily.   Yes [provider]  senna-docusate (SENNA S) 8.6-50 MG tablet Take 1 tablet by mouth 2 (two) times daily.   Yes [provider]  torsemide (DEMADEX) 10 MG tablet Take 10 mg by mouth See admin instructions. 10 mg PO five times weekly (Tuesdays, Wednesdays, Fridays, Saturdays, and Sundays) 08/17/19  Yes [provider]  torsemide (DEMADEX)  20 MG tablet Take 20 mg by mouth 2 (two) times a week. On Mondays and Thursdays 09/15/19  Yes [provider]  traZODone (DESYREL) 50 MG tablet Take 1 tablet (50 mg total) by mouth at bedtime. 09/28/19  Yes Gherghe, Vella Redhead, MD  lactulose (CHRONULAC) 10 GM/15ML solution Take 20 g by mouth once.    [provider]    Allergies    Morphine and related, Cephalexin, and Penicillins  Review of Systems   Review of Systems  Gastrointestinal: Positive for constipation.  All other systems reviewed and are negative.   Physical Exam Updated Vital Signs BP (!) 139/57   Pulse 93   Temp 98.5 F (36.9 C) (Oral)   Resp 18   SpO2 98%   Physical Exam Vitals and nursing note reviewed.  Constitutional:      Appearance: She is well-developed.  HENT:     Head: Normocephalic and atraumatic.  Cardiovascular:     Rate and Rhythm:  Normal rate and regular rhythm.  Pulmonary:     Effort: Pulmonary effort is normal. No respiratory distress.  Abdominal:     Palpations: Abdomen is soft.     Comments: Abdominal distention with moderate abdominal tenderness. There is a large area of ecchymosis across the left lower quadrant.  Genitourinary:    Comments: Small sacral decubitus without local erythema or drainage. There is brown stool in the rectal vault Musculoskeletal:     Comments: 2+ DP pulses bilaterally. There is tenderness to palpation to bilateral lower extremities with light touch. There is a large area of ecchymosis to the left lateral thigh with tenderness to light touch similar to other areas of the leg. No pain with passive range of motion of the left hip or knee.  Skin:    General: Skin is warm and dry.  Neurological:     Mental Status: She is alert.     Comments: Disoriented to place and time.  Psychiatric:     Comments: Mildly anxious     ED Results / Procedures / Treatments   Labs (all labs ordered are listed, but only abnormal results are displayed) Labs Reviewed -  No data to display  EKG None  Radiology No results found.  Procedures Procedures (including critical care time)  Medications Ordered in ED Medications - No data to display  ED Course  I have reviewed the triage vital signs and the nursing notes.  Pertinent labs & imaging results that were available during my care of the patient were reviewed by me and considered in my medical decision making (see chart for details).    MDM Rules/Calculators/A&P                     Patient with recent COVID-19 infection here for evaluation of abdominal pain and nausea with ileus on plain film. She is on eliquis for presumed PE in the setting of COVID-19 infection. She does have ecchymosis to her abdominal wall but no history of trauma. She also has ecchymosis to her left thigh. She is diffuse tenderness to palpation throughout bilateral lower extremities but is well perfused in bilateral lower extremities. She is able to range the legs passively without significant increase in her pain. Plan to obtain labs, CT abdomen pelvis as well as plain film of the left femur to further evaluate her symptoms. Patient care transferred pending labs and imaging.  Final Clinical Impression(s) / ED Diagnoses Final diagnoses:  None    Rx / DC Orders ED Discharge Orders    None       Quintella Reichert, MD 10/06/19 1558

## 2019-10-06 ENCOUNTER — Emergency Department (HOSPITAL_COMMUNITY): Payer: Medicare Other

## 2019-10-06 DIAGNOSIS — I5032 Chronic diastolic (congestive) heart failure: Secondary | ICD-10-CM | POA: Diagnosis not present

## 2019-10-06 DIAGNOSIS — E039 Hypothyroidism, unspecified: Secondary | ICD-10-CM

## 2019-10-06 DIAGNOSIS — D649 Anemia, unspecified: Secondary | ICD-10-CM | POA: Diagnosis not present

## 2019-10-06 DIAGNOSIS — N390 Urinary tract infection, site not specified: Secondary | ICD-10-CM

## 2019-10-06 DIAGNOSIS — K5641 Fecal impaction: Principal | ICD-10-CM

## 2019-10-06 DIAGNOSIS — N183 Chronic kidney disease, stage 3 unspecified: Secondary | ICD-10-CM | POA: Diagnosis present

## 2019-10-06 LAB — URINALYSIS, ROUTINE W REFLEX MICROSCOPIC
Bilirubin Urine: NEGATIVE
Glucose, UA: NEGATIVE mg/dL
Hgb urine dipstick: NEGATIVE
Ketones, ur: NEGATIVE mg/dL
Nitrite: POSITIVE — AB
Protein, ur: NEGATIVE mg/dL
Specific Gravity, Urine: 1.009 (ref 1.005–1.030)
WBC, UA: >50 WBC/hpf — ABNORMAL HIGH (ref 0–5)
pH: 5 (ref 5.0–8.0)

## 2019-10-06 LAB — CBC WITH DIFFERENTIAL/PLATELET
Abs Immature Granulocytes: 0.24 10*3/uL — ABNORMAL HIGH (ref 0.00–0.07)
Basophils Absolute: 0 10*3/uL (ref 0.0–0.1)
Basophils Relative: 0 %
Eosinophils Absolute: 0.2 10*3/uL (ref 0.0–0.5)
Eosinophils Relative: 1 %
HCT: 26.9 % — ABNORMAL LOW (ref 36.0–46.0)
Hemoglobin: 8.4 g/dL — ABNORMAL LOW (ref 12.0–15.0)
Immature Granulocytes: 2 %
Lymphocytes Relative: 3 %
Lymphs Abs: 0.4 10*3/uL — ABNORMAL LOW (ref 0.7–4.0)
MCH: 31.3 pg (ref 26.0–34.0)
MCHC: 31.2 g/dL (ref 30.0–36.0)
MCV: 100.4 fL — ABNORMAL HIGH (ref 80.0–100.0)
Monocytes Absolute: 0.6 10*3/uL (ref 0.1–1.0)
Monocytes Relative: 5 %
Neutro Abs: 11.1 10*3/uL — ABNORMAL HIGH (ref 1.7–7.7)
Neutrophils Relative %: 89 %
Platelets: 197 10*3/uL (ref 150–400)
RBC: 2.68 MIL/uL — ABNORMAL LOW (ref 3.87–5.11)
RDW: 15.9 % — ABNORMAL HIGH (ref 11.5–15.5)
WBC: 12.5 10*3/uL — ABNORMAL HIGH (ref 4.0–10.5)
nRBC: 0 % (ref 0.0–0.2)

## 2019-10-06 LAB — LACTIC ACID, PLASMA: Lactic Acid, Venous: 1.2 mmol/L (ref 0.5–1.9)

## 2019-10-06 LAB — COMPREHENSIVE METABOLIC PANEL
ALT: 24 U/L (ref 0–44)
ALT: 26 U/L (ref 0–44)
AST: 17 U/L (ref 15–41)
AST: 18 U/L (ref 15–41)
Albumin: 2.4 g/dL — ABNORMAL LOW (ref 3.5–5.0)
Albumin: 2.6 g/dL — ABNORMAL LOW (ref 3.5–5.0)
Alkaline Phosphatase: 67 U/L (ref 38–126)
Alkaline Phosphatase: 71 U/L (ref 38–126)
Anion gap: 11 (ref 5–15)
Anion gap: 15 (ref 5–15)
BUN: 67 mg/dL — ABNORMAL HIGH (ref 8–23)
BUN: 65 mg/dL — ABNORMAL HIGH (ref 8–23)
CO2: 24 mmol/L (ref 22–32)
CO2: 22 mmol/L (ref 22–32)
Calcium: 8.2 mg/dL — ABNORMAL LOW (ref 8.9–10.3)
Calcium: 8.4 mg/dL — ABNORMAL LOW (ref 8.9–10.3)
Chloride: 102 mmol/L (ref 98–111)
Chloride: 100 mmol/L (ref 98–111)
Creatinine, Ser: 1.93 mg/dL — ABNORMAL HIGH (ref 0.44–1.00)
Creatinine, Ser: 1.89 mg/dL — ABNORMAL HIGH (ref 0.44–1.00)
GFR calc Af Amer: 26 mL/min — ABNORMAL LOW (ref >60)
GFR calc Af Amer: 26 mL/min — ABNORMAL LOW (ref >60)
GFR calc non Af Amer: 22 mL/min — ABNORMAL LOW (ref >60)
GFR calc non Af Amer: 23 mL/min — ABNORMAL LOW (ref >60)
Glucose, Bld: 124 mg/dL — ABNORMAL HIGH (ref 70–99)
Glucose, Bld: 124 mg/dL — ABNORMAL HIGH (ref 70–99)
Potassium: 4 mmol/L (ref 3.5–5.1)
Potassium: 4 mmol/L (ref 3.5–5.1)
Sodium: 137 mmol/L (ref 135–145)
Sodium: 137 mmol/L (ref 135–145)
Total Bilirubin: 0.7 mg/dL (ref 0.3–1.2)
Total Bilirubin: 1.1 mg/dL (ref 0.3–1.2)
Total Protein: 6 g/dL — ABNORMAL LOW (ref 6.5–8.1)
Total Protein: 6.1 g/dL — ABNORMAL LOW (ref 6.5–8.1)

## 2019-10-06 LAB — CBC
HCT: 29.9 % — ABNORMAL LOW (ref 36.0–46.0)
Hemoglobin: 9.1 g/dL — ABNORMAL LOW (ref 12.0–15.0)
MCH: 31.2 pg (ref 26.0–34.0)
MCHC: 30.4 g/dL (ref 30.0–36.0)
MCV: 102.4 fL — ABNORMAL HIGH (ref 80.0–100.0)
Platelets: 213 10*3/uL (ref 150–400)
RBC: 2.92 MIL/uL — ABNORMAL LOW (ref 3.87–5.11)
RDW: 15.6 % — ABNORMAL HIGH (ref 11.5–15.5)
WBC: 11.4 10*3/uL — ABNORMAL HIGH (ref 4.0–10.5)
nRBC: 0 % (ref 0.0–0.2)

## 2019-10-06 MED ORDER — POLYETHYLENE GLYCOL 3350 17 G PO PACK: 17.0000 g | PACK | Freq: Every day | ORAL | Status: DC | PRN

## 2019-10-06 MED ORDER — ADULT MULTIVITAMIN W/MINERALS CH
1.0000 | ORAL_TABLET | Freq: Every day | ORAL | Status: DC
  Administered 2019-10-06 – 2019-10-08 (×3): 1 via ORAL
  Filled 2019-10-06 (×3): qty 1

## 2019-10-06 MED ORDER — ONDANSETRON 4 MG PO TBDP: 4.0000 mg | ORAL_TABLET | Freq: Three times a day (TID) | ORAL | Status: DC | PRN

## 2019-10-06 MED ORDER — TORSEMIDE 20 MG PO TABS
20.0000 mg | ORAL_TABLET | ORAL | Status: DC
  Administered 2019-10-08: 09:00:00 20 mg via ORAL
  Filled 2019-10-06: qty 1

## 2019-10-06 MED ORDER — APIXABAN 5 MG PO TABS
5.0000 mg | ORAL_TABLET | Freq: Two times a day (BID) | ORAL | Status: DC
  Administered 2019-10-06 – 2019-10-08 (×5): 5 mg via ORAL
  Filled 2019-10-06: qty 2
  Filled 2019-10-06 (×4): qty 1

## 2019-10-06 MED ORDER — LEVOTHYROXINE SODIUM 75 MCG PO TABS
75.0000 ug | ORAL_TABLET | Freq: Every day | ORAL | Status: DC
  Administered 2019-10-07 – 2019-10-08 (×2): 75 ug via ORAL
  Filled 2019-10-06 (×2): qty 1

## 2019-10-06 MED ORDER — BACID PO TABS
2.0000 | ORAL_TABLET | Freq: Two times a day (BID) | ORAL | Status: DC
  Filled 2019-10-06: qty 2

## 2019-10-06 MED ORDER — ISOSORBIDE DINITRATE 20 MG PO TABS
40.0000 mg | ORAL_TABLET | Freq: Two times a day (BID) | ORAL | Status: DC
  Administered 2019-10-06 – 2019-10-08 (×4): 40 mg via ORAL
  Filled 2019-10-06 (×6): qty 2

## 2019-10-06 MED ORDER — FEBUXOSTAT 40 MG PO TABS
80.0000 mg | ORAL_TABLET | Freq: Every day | ORAL | Status: DC
  Administered 2019-10-06 – 2019-10-08 (×3): 80 mg via ORAL
  Filled 2019-10-06 (×4): qty 2

## 2019-10-06 MED ORDER — BISACODYL 10 MG RE SUPP: 10.0000 mg | Freq: Two times a day (BID) | RECTAL | Status: DC | PRN

## 2019-10-06 MED ORDER — TRAZODONE HCL 50 MG PO TABS
50.0000 mg | ORAL_TABLET | Freq: Every day | ORAL | Status: DC
  Administered 2019-10-06 – 2019-10-07 (×2): 50 mg via ORAL
  Filled 2019-10-06 (×2): qty 1

## 2019-10-06 MED ORDER — HEPARIN SODIUM (PORCINE) 5000 UNIT/ML IJ SOLN
5000.0000 [IU] | Freq: Three times a day (TID) | INTRAMUSCULAR | Status: DC
  Administered 2019-10-06: 5000 [IU] via SUBCUTANEOUS
  Filled 2019-10-06: qty 1

## 2019-10-06 MED ORDER — POTASSIUM CHLORIDE CRYS ER 10 MEQ PO TBCR
10.0000 meq | EXTENDED_RELEASE_TABLET | Freq: Every day | ORAL | Status: DC
  Administered 2019-10-06 – 2019-10-08 (×3): 10 meq via ORAL
  Filled 2019-10-06 (×3): qty 1

## 2019-10-06 MED ORDER — LORAZEPAM 0.5 MG PO TABS
0.5000 mg | ORAL_TABLET | Freq: Every day | ORAL | Status: DC
  Administered 2019-10-07 – 2019-10-08 (×2): 0.5 mg via ORAL
  Filled 2019-10-06 (×2): qty 1

## 2019-10-06 MED ORDER — POLYETHYLENE GLYCOL 3350 17 G PO PACK
17.0000 g | PACK | Freq: Every day | ORAL | Status: DC
  Administered 2019-10-06 – 2019-10-07 (×2): 17 g via ORAL
  Filled 2019-10-06 (×3): qty 1

## 2019-10-06 MED ORDER — ACETAMINOPHEN 650 MG RE SUPP: 650.0000 mg | Freq: Four times a day (QID) | RECTAL | Status: DC | PRN

## 2019-10-06 MED ORDER — METOPROLOL SUCCINATE ER 25 MG PO TB24
25.0000 mg | ORAL_TABLET | Freq: Every day | ORAL | Status: DC
  Administered 2019-10-06 – 2019-10-08 (×3): 25 mg via ORAL
  Filled 2019-10-06 (×3): qty 1

## 2019-10-06 MED ORDER — PANTOPRAZOLE SODIUM 40 MG PO TBEC
40.0000 mg | DELAYED_RELEASE_TABLET | Freq: Every day | ORAL | Status: DC
  Administered 2019-10-06 – 2019-10-08 (×3): 40 mg via ORAL
  Filled 2019-10-06 (×3): qty 1

## 2019-10-06 MED ORDER — SODIUM CHLORIDE 0.9 % IV SOLN
500.0000 mg | Freq: Two times a day (BID) | INTRAVENOUS | Status: DC
  Administered 2019-10-06 – 2019-10-07 (×2): 500 mg via INTRAVENOUS
  Filled 2019-10-06: qty 500
  Filled 2019-10-06 (×2): qty 0.5

## 2019-10-06 MED ORDER — ISOSORBIDE DINITRATE 20 MG PO TABS
20.0000 mg | ORAL_TABLET | Freq: Every day | ORAL | Status: DC
  Administered 2019-10-07 – 2019-10-08 (×2): 20 mg via ORAL
  Filled 2019-10-06 (×2): qty 1

## 2019-10-06 MED ORDER — ACETAMINOPHEN 325 MG PO TABS
650.0000 mg | ORAL_TABLET | Freq: Four times a day (QID) | ORAL | Status: DC | PRN
  Administered 2019-10-06 – 2019-10-08 (×4): 650 mg via ORAL
  Filled 2019-10-06 (×5): qty 2

## 2019-10-06 MED ORDER — VITAMIN D 25 MCG (1000 UNIT) PO TABS
2000.0000 [IU] | ORAL_TABLET | Freq: Every day | ORAL | Status: DC
  Administered 2019-10-06 – 2019-10-08 (×3): 2000 [IU] via ORAL
  Filled 2019-10-06 (×3): qty 2

## 2019-10-06 MED ORDER — SENNOSIDES-DOCUSATE SODIUM 8.6-50 MG PO TABS
1.0000 | ORAL_TABLET | Freq: Two times a day (BID) | ORAL | Status: DC
  Administered 2019-10-06 – 2019-10-08 (×4): 1 via ORAL
  Filled 2019-10-06 (×4): qty 1

## 2019-10-06 MED ORDER — HYDROCODONE-ACETAMINOPHEN 5-325 MG PO TABS
1.0000 | ORAL_TABLET | Freq: Three times a day (TID) | ORAL | Status: DC
  Administered 2019-10-06 – 2019-10-08 (×6): 1 via ORAL
  Filled 2019-10-06 (×6): qty 1

## 2019-10-06 MED ORDER — HYDRALAZINE HCL 25 MG PO TABS
25.0000 mg | ORAL_TABLET | Freq: Three times a day (TID) | ORAL | Status: DC
  Administered 2019-10-06 – 2019-10-08 (×6): 25 mg via ORAL
  Filled 2019-10-06 (×6): qty 1

## 2019-10-06 MED ORDER — AMLODIPINE BESYLATE 5 MG PO TABS
5.0000 mg | ORAL_TABLET | Freq: Every day | ORAL | Status: DC
  Administered 2019-10-06 – 2019-10-08 (×3): 5 mg via ORAL
  Filled 2019-10-06 (×3): qty 1

## 2019-10-06 MED ORDER — PHENYLEPHRINE-MINERAL OIL-PET 0.25-14-74.9 % RE OINT
1.0000 "application " | TOPICAL_OINTMENT | Freq: Four times a day (QID) | RECTAL | Status: DC | PRN
  Filled 2019-10-06: qty 57

## 2019-10-06 MED ORDER — SODIUM CHLORIDE 0.9 % IV SOLN
1.0000 g | Freq: Once | INTRAVENOUS | Status: AC
  Administered 2019-10-06: 1 g via INTRAVENOUS
  Filled 2019-10-06: qty 1

## 2019-10-06 MED ORDER — TORSEMIDE 10 MG PO TABS
10.0000 mg | ORAL_TABLET | ORAL | Status: DC
  Administered 2019-10-07: 09:00:00 10 mg via ORAL
  Filled 2019-10-06 (×2): qty 1

## 2019-10-06 MED ORDER — LACTINEX PO CHEW
1.0000 | CHEWABLE_TABLET | Freq: Three times a day (TID) | ORAL | Status: DC
  Administered 2019-10-07 – 2019-10-08 (×5): 1 via ORAL
  Filled 2019-10-06 (×8): qty 1

## 2019-10-06 MED ORDER — FLEET ENEMA 7-19 GM/118ML RE ENEM
1.0000 | ENEMA | Freq: Once | RECTAL | Status: AC
  Administered 2019-10-06: 1 via RECTAL
  Filled 2019-10-06: qty 1

## 2019-10-06 MED ORDER — ONDANSETRON HCL 4 MG/2ML IJ SOLN
4.0000 mg | Freq: Once | INTRAMUSCULAR | Status: AC
  Administered 2019-10-06: 4 mg via INTRAVENOUS
  Filled 2019-10-06: qty 2

## 2019-10-06 NOTE — ED Notes (Signed)
ED TO INPATIENT HANDOFF REPORT  Name/Age/Gender Krystal Mckenzie 86 y.o. female  Code Status    Code Status Orders  (From admission, onward)         Start     Ordered   10/06/19 0329  Do not attempt resuscitation (DNR)  Continuous    Question Answer Comment  In the event of cardiac or respiratory ARREST Do not call a "code blue"   In the event of cardiac or respiratory ARREST Do not perform Intubation, CPR, defibrillation or ACLS   In the event of cardiac or respiratory ARREST Use medication by any route, position, wound care, and other measures to relive pain and suffering. May use oxygen, suction and manual treatment of airway obstruction as needed for comfort.      10/06/19 0334        Code Status History    Date Active Date Inactive Code Status Order ID Comments User Context   09/21/2019 0024 09/28/2019 2139 DNR [id number]  Vianne Bulls, MD ED   09/20/2019 2231 09/21/2019 0024 DNR [id number]  Drenda Freeze, MD ED   09/20/2019 2222 09/20/2019 2231 DNR [id number]  Drenda Freeze, MD ED   Advance Care Planning Activity    Advance Directive Documentation     Most Recent Value  Type of Advance Directive  Out of facility DNR (pink MOST or yellow form)  Pre-existing out of facility DNR order (yellow form or pink MOST form)  Pink MOST/Yellow Form most recent copy in chart - Physician notified to receive inpatient order  "MOST" Form in Place?  -      Home/SNF/Other Skilled nursing facility  Chief Complaint Acute lower UTI [N39.0]  Level of Care/Admitting Diagnosis ED Disposition    ED Disposition Condition [place]: [facility] [[id number]]  Level of Care: Telemetry [5]  Admit to tele based on following criteria: Monitor for Ischemic changes  Covid Evaluation: Confirmed COVID Positive  Diagnosis: Acute lower UTI [id number]  Admitting Physician: Jani Gravel [[id number]]  Attending Physician: Jani Gravel [[id number]]       Medical  History Past Medical History:  Diagnosis Date  . CHF (congestive heart failure) ([place])   . GERD (gastroesophageal reflux disease)   . Hypertension   . Hypothyroid   . Renal disorder    Stage 4    Allergies Allergies  Allergen Reactions  . Morphine And Related   . Cephalexin Hives, Swelling and Rash  . Penicillins Swelling and Rash    Did it involve swelling of the face/tongue/throat, SOB, or low BP? Unknown Did it involve sudden or severe rash/hives, skin peeling, or any reaction on the inside of your mouth or nose? Unknown Did you need to seek medical attention at a hospital or doctor's office? Unknown When did it last happen? Unknown If all above answers are "NO", may proceed with cephalosporin use.     IV Location/Drains/Wounds Patient Lines/Drains/Airways Status   Active Line/Drains/Airways    Name:   Placement date:   Placement time:   Site:   Days:   Peripheral IV 10/06/19 Left Antecubital   10/06/19    0046    Antecubital   less than 1   External Urinary Catheter   09/27/19    0325    -   9   Pressure Injury 09/21/19 Buttocks Left Stage 2 -  Partial thickness loss of dermis presenting as a shallow open injury with a red, pink wound bed without slough.  09/21/19    1600     15   Pressure Injury 09/21/19 Buttocks Right Stage 1 -  Intact skin with non-blanchable redness of a localized area usually over a bony prominence.   09/21/19    1600     15          Labs/Imaging Results for orders placed or performed during the hospital encounter of 10/05/19 (from the past 48 hour(s))  Comprehensive metabolic panel     Status: Abnormal   Collection Time: 10/06/19 12:50 AM  Result Value Ref Range   Sodium 137 135 - 145 mmol/L   Potassium 4.0 3.5 - 5.1 mmol/L   Chloride 102 98 - 111 mmol/L   CO2 24 22 - 32 mmol/L   Glucose, Bld 124 (H) 70 - 99 mg/dL   BUN 67 (H) 8 - 23 mg/dL   Creatinine, Ser 1.93 (H) 0.44 - 1.00 mg/dL   Calcium 8.2 (L) 8.9 - 10.3 mg/dL   Total Protein  6.0 (L) 6.5 - 8.1 g/dL   Albumin 2.4 (L) 3.5 - 5.0 g/dL   AST 17 15 - 41 U/L   ALT 24 0 - 44 U/L   Alkaline Phosphatase 67 38 - 126 U/L   Total Bilirubin 0.7 0.3 - 1.2 mg/dL   GFR calc non Af Amer 22 (L) >60 mL/min   GFR calc Af Amer 26 (L) >60 mL/min   Anion gap 11 5 - 15    Comment: Performed at [facility], [place] [address], [place], [place] [address]  CBC with Differential     Status: Abnormal   Collection Time: 10/06/19 12:50 AM  Result Value Ref Range   WBC 12.5 (H) 4.0 - 10.5 K/uL   RBC 2.68 (L) 3.87 - 5.11 MIL/uL   Hemoglobin 8.4 (L) 12.0 - 15.0 g/dL   HCT 26.9 (L) 36.0 - 46.0 %   MCV 100.4 (H) 80.0 - 100.0 fL   MCH 31.3 26.0 - 34.0 pg   MCHC 31.2 30.0 - 36.0 g/dL   RDW 15.9 (H) 11.5 - 15.5 %   Platelets 197 150 - 400 K/uL   nRBC 0.0 0.0 - 0.2 %   Neutrophils Relative % 89 %   Neutro Abs 11.1 (H) 1.7 - 7.7 K/uL   Lymphocytes Relative 3 %   Lymphs Abs 0.4 (L) 0.7 - 4.0 K/uL   Monocytes Relative 5 %   Monocytes Absolute 0.6 0.1 - 1.0 K/uL   Eosinophils Relative 1 %   Eosinophils Absolute 0.2 0.0 - 0.5 K/uL   Basophils Relative 0 %   Basophils Absolute 0.0 0.0 - 0.1 K/uL   Immature Granulocytes 2 %   Abs Immature Granulocytes 0.24 (H) 0.00 - 0.07 K/uL    Comment: Performed at [facility], [place] [address], [place], [place] [address]  Lactic acid, plasma     Status: None   Collection Time: 10/06/19 12:54 AM  Result Value Ref Range   Lactic Acid, Venous 1.2 0.5 - 1.9 mmol/L    Comment: Performed at [facility], [place] [address] [place], [place] [address]  Urinalysis, Routine w reflex microscopic     Status: Abnormal   Collection Time: 10/06/19  1:48 AM  Result Value Ref Range   Color, Urine YELLOW YELLOW   APPearance CLEAR CLEAR   Specific Gravity, Urine 1.009 1.005 - 1.030   pH 5.0 5.0 - 8.0   Glucose, UA NEGATIVE NEGATIVE mg/dL   Hgb urine dipstick NEGATIVE  NEGATIVE   Bilirubin Urine NEGATIVE NEGATIVE    Ketones, ur NEGATIVE NEGATIVE mg/dL   Protein, ur NEGATIVE NEGATIVE mg/dL   Nitrite POSITIVE (A) NEGATIVE   Leukocytes,Ua LARGE (A) NEGATIVE   RBC / HPF 0-5 0 - 5 RBC/hpf   WBC, UA >50 (H) 0 - 5 WBC/hpf   Bacteria, UA RARE (A) NONE SEEN   Mucus PRESENT    Hyaline Casts, UA PRESENT     Comment: Performed at [facility], [place] [address] [place], [place] [address]   CT Abdomen Pelvis Wo Contrast  Result Date: 10/06/2019 CLINICAL DATA:  Abdominal distension EXAM: CT ABDOMEN AND PELVIS WITHOUT CONTRAST TECHNIQUE: Multidetector CT imaging of the abdomen and pelvis was performed following the standard protocol without IV contrast. COMPARISON:  09/24/2019 FINDINGS: Lower chest: Bibasilar atelectatic changes are noted improved from the prior exam. Previously seen effusions have resolved. Hepatobiliary: No focal liver abnormality is seen. No gallstones, gallbladder wall thickening, or biliary dilatation. Pancreas: Pancreas is well visualized in the tail and body. In the region of the head of the pancreas there is significant increased density identified which is new from the prior exam from 10 days previous. This likely represents filling of a large duodenal diverticulum with adjacent pancreatic tissue as opposed to a pancreatic abnormality. Spleen: Normal in size without focal abnormality. Adrenals/Urinary Tract: Adrenal glands are within normal limits. Kidneys are well visualized bilaterally with tiny nonobstructing renal stones. No obstructive changes are seen. The bladder is well distended. Stomach/Bowel: There is evidence of fecal impaction within the rectum. Diverticular change without evidence of diverticulitis is seen. No obstructive or inflammatory changes of the colon are seen. The small bowel is otherwise within limits. Stomach is within normal limits. Vascular/Lymphatic: Aortic atherosclerosis. No enlarged abdominal or pelvic lymph nodes. Reproductive: Uterus and bilateral adnexa  are unremarkable. Other: No abdominal wall hernia or abnormality. No abdominopelvic ascites. Musculoskeletal: Degenerative changes of the lumbar spine are noted. Changes of prior vertebral augmentation seen at T12. IMPRESSION: Changes consistent with fecal impaction within the rectum. 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 No other focal abnormality is noted. Electronically Signed   By: Inez Catalina M.D.   On: 10/06/2019 01:41   DG Chest 1 View  Result Date: 10/06/2019 CLINICAL DATA:  Pain EXAM: CHEST  1 VIEW COMPARISON:  09/27/2019 FINDINGS: Cardiac shadow is enlarged but stable. Aortic calcifications are again seen. The lungs are well aerated bilaterally. No focal infiltrate or sizable effusion is seen. Clearing of previously seen left basilar infiltrate is noted. No bony abnormality is noted. Changes of prior vertebral augmentation are seen. IMPRESSION: No acute abnormality noted. Electronically Signed   By: Inez Catalina M.D.   On: 10/06/2019 01:25   DG Femur Min 2 Views Left  Result Date: 10/06/2019 CLINICAL DATA:  Left leg bruising EXAM: LEFT FEMUR 2 VIEWS COMPARISON:  None. FINDINGS: Left knee prosthesis is seen. No definitive acute fracture or dislocation is seen. No soft tissue abnormality is noted. IMPRESSION: No acute abnormality noted. Electronically Signed   By: Inez Catalina M.D.   On: 10/06/2019 01:27    Pending Labs Unresulted Labs (From admission, onward)    Start     Ordered   10/06/19 0500  Comprehensive metabolic panel  Tomorrow morning,   R    Question:  Release to patient  Answer:  Immediate   10/06/19 0334   10/06/19 0500  CBC  Tomorrow morning,   R    Question:  Release to patient  Answer:  Immediate   10/06/19 0334   10/06/19 0328  Creatinine, serum  (heparin)  Once,   STAT    Comments: Baseline for heparin therapy IF NOT ALREADY DRAWN.    10/06/19 0334   10/06/19 0030   Culture, blood (routine x 2)  BLOOD CULTURE X 2,   STAT     10/06/19 0029   10/05/19 2326  Urine culture  ONCE - STAT,   STAT     10/05/19 2330          Vitals/Pain Today's Vitals   10/05/19 2204 10/05/19 2241 10/06/19 0130 10/06/19 0300  BP:  (!) 139/57 121/67 138/64  Pulse:  93 88 84  Resp:  18 16 16   Temp:      TempSrc:      SpO2: 100% 98% 96% 97%    Isolation Precautions No active isolations  Medications Medications  iohexol (OMNIPAQUE) 9 MG/ML oral solution 500 mL (has no administration in time range)  heparin injection 5,000 Units (has no administration in time range)  acetaminophen (TYLENOL) tablet 650 mg (has no administration in time range)    Or  acetaminophen (TYLENOL) suppository 650 mg (has no administration in time range)  meropenem (MERREM) 1 g in sodium chloride 0.9 % 100 mL IVPB (1 g Intravenous New Bag/Given 10/06/19 0358)  fentaNYL (SUBLIMAZE) injection 25 mcg (25 mcg Intravenous Given 10/06/19 0047)  ondansetron (ZOFRAN) injection 4 mg (4 mg Intravenous Given 10/06/19 0150)  sodium phosphate (FLEET) 7-19 GM/118ML enema 1 enema (1 enema Rectal Given 10/06/19 0250)    Mobility non-ambulatory

## 2019-10-06 NOTE — Progress Notes (Signed)
PROGRESS NOTE    Krystal Mckenzie  [id number] DOB: July 05, 1927 DOA: 10/05/2019 PCP: Patient, No Pcp Per    Brief Narrative:   86 y.o. female,  chronic diastolic CHF, hypertension, hypothyroidism, chronic kidney disease, covid-19 positive w recent admission 09/20/19, presents with constipation, lack of BM x 5 days and found to have UTI.  Pt is a poor historian and unable to provide much history.   In Ed,  CT abd/ pelvis IMPRESSION: Changes consistent with fecal impaction within the rectum.  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  No other focal abnormality is noted.  Assessment & Plan:   Principal Problem:   Fecal impaction (HCC) Active Problems:   Hypothyroid   Chronic diastolic CHF (congestive heart failure) (HCC)   Acute lower UTI   CKD (chronic kidney disease), stage III   Fecal impaction -Resolved with cathartics given on presentation -Continue with cathartics as needed  Acute lower UTI -UA suggestive of UTI. Patient complains of pain in the pubic region this AM -Urine cx pending. Previous urine cx from 1/17 with insignificant growth -currently on Meropenem iv pharmacy to dose  Presumed PE, see discharge summary from prior admission -Cont Eliquis x 73months -Stable at this time. Was discharged recently on [facility] was on 3L[facility] this AM but currently back to [facility]  Hypothyroidism -Cont Levothyroxine as tolerated  Chronic diastolic CHF Hypertension -Cont Amlodipine 5mg  po qday, Hydralazine 25mg  op tid, Isordil 20mg  po qday, Toprol XL 25mg  po qday -Continued on Torsemide 20mg  po 2x per week Monday, Thursday, 10mg  po qday other days.   DVT prophylaxis: eliquis Code Status: DNR Family Communication: Pt in room, family not at bedside Disposition Plan: Return to facility pending bed availability  Consultants:     Procedures:      Antimicrobials: Anti-infectives (From admission, onward)   Start     Dose/Rate Route Frequency Ordered Stop   10/06/19 1800  meropenem (MERREM) 500 mg in sodium chloride 0.9 % 100 mL IVPB     500 mg 200 mL/hr over 30 Minutes Intravenous Every 12 hours 10/06/19 0647     10/06/19 0345  meropenem (MERREM) 1 g in sodium chloride 0.9 % 100 mL IVPB     1 g 200 mL/hr over 30 Minutes Intravenous  Once 10/06/19 0335 10/06/19 0436       Subjective: Complains of pain in the pubic region and feeling uncomfortable laying in bed. Asking for her pain meds  Objective: Vitals:   10/06/19 1339 10/06/19 1355 10/06/19 1427 10/06/19 1431  BP:  125/60    Pulse:  85    Resp:  (!) 6 19   Temp:  98 F (36.7 C)    TempSrc:  Oral    SpO2: 93%  99% 99%    Intake/Output Summary (Last 24 hours) at 10/06/2019 1436 Last data filed at 10/06/2019 1355 Gross per 24 hour  Intake 336.34 ml  Output --  Net 336.34 ml   There were no vitals filed for this visit.  Examination:  General exam: Appears calm and comfortable  Respiratory system: Clear to auscultation. Respiratory effort normal. Cardiovascular system: S1 & S2 heard, Regular Gastrointestinal system: Abdomen is nondistended, soft and nontender. No organomegaly or masses felt. Normal bowel sounds heard. Central nervous system: Alert and oriented. No focal neurological deficits. Extremities: Symmetric 5 x 5 power. Skin: No rashes, lesions Psychiatry: Judgement and insight appear normal. Mood & affect appropriate.  Data Reviewed: I have personally reviewed following labs and imaging studies  CBC: Recent Labs  Lab 10/06/19 0050 10/06/19 0604  WBC 12.5* 11.4*  NEUTROABS 11.1*  --   HGB 8.4* 9.1*  HCT 26.9* 29.9*  MCV 100.4* 102.4*  PLT 197 [id number]   Basic Metabolic Panel: Recent Labs  Lab 10/06/19 0050 10/06/19 0604  NA 137 137  K 4.0 4.0  CL 102 100  CO2 24 22  GLUCOSE 124* 124*  BUN 67* 65*  CREATININE 1.93* 1.89*  CALCIUM  8.2* 8.4*   GFR: Estimated Creatinine Clearance: 18.6 mL/min (A) (by C-G formula based on SCr of 1.89 mg/dL (H)). Liver Function Tests: Recent Labs  Lab 10/06/19 0050 10/06/19 0604  AST 17 18  ALT 24 26  ALKPHOS 67 71  BILITOT 0.7 1.1  PROT 6.0* 6.1*  ALBUMIN 2.4* 2.6*   No results for input(s): LIPASE, AMYLASE in the last 168 hours. No results for input(s): AMMONIA in the last 168 hours. Coagulation Profile: No results for input(s): INR, PROTIME in the last 168 hours. Cardiac Enzymes: No results for input(s): CKTOTAL, CKMB, CKMBINDEX, TROPONINI in the last 168 hours. BNP (last 3 results) No results for input(s): PROBNP in the last 8760 hours. HbA1C: No results for input(s): HGBA1C in the last 72 hours. CBG: No results for input(s): GLUCAP in the last 168 hours. Lipid Profile: No results for input(s): CHOL, HDL, LDLCALC, TRIG, CHOLHDL, LDLDIRECT in the last 72 hours. Thyroid Function Tests: No results for input(s): TSH, T4TOTAL, FREET4, T3FREE, THYROIDAB in the last 72 hours. Anemia Panel: No results for input(s): VITAMINB12, FOLATE, FERRITIN, TIBC, IRON, RETICCTPCT in the last 72 hours. Sepsis Labs: Recent Labs  Lab 10/06/19 0054  LATICACIDVEN 1.2    Recent Results (from the past 240 hour(s))  Culture, blood (routine x 2)     Status: None (Preliminary result)   Collection Time: 10/06/19 12:51 AM   Specimen: BLOOD  Result Value Ref Range Status   Specimen Description BLOOD LEFT ANTECUBITAL  Final   Special Requests   Final    BOTTLES DRAWN AEROBIC AND ANAEROBIC Blood Culture adequate volume Performed at [facility] [address], [place], [place] [address]    Culture NO GROWTH <12 HOURS  Final   Report Status PENDING  Incomplete  Culture, blood (routine x 2)     Status: None (Preliminary result)   Collection Time: 10/06/19  1:56 AM   Specimen: BLOOD  Result Value Ref Range Status   Specimen Description BLOOD RIGHT ANTECUBITAL  Final    Special Requests   Final    BOTTLES DRAWN AEROBIC AND ANAEROBIC Blood Culture results may not be optimal due to an excessive volume of blood received in culture bottles Performed at [facility], [place] [address] [place], [place] [address]    Culture NO GROWTH <12 HOURS  Final   Report Status PENDING  Incomplete     Radiology Studies: CT Abdomen Pelvis Wo Contrast  Result Date: 10/06/2019 CLINICAL DATA:  Abdominal distension EXAM: CT ABDOMEN AND PELVIS WITHOUT CONTRAST TECHNIQUE: Multidetector CT imaging of the abdomen and pelvis was performed following the standard protocol without IV contrast. COMPARISON:  09/24/2019 FINDINGS: Lower chest: Bibasilar atelectatic changes are noted improved from the prior exam. Previously seen effusions have resolved. Hepatobiliary: No focal liver abnormality is seen. No gallstones, gallbladder wall thickening, or biliary dilatation. Pancreas: Pancreas is well visualized in the tail and body. In the region of the head of the pancreas  there is significant increased density identified which is new from the prior exam from 10 days previous. This likely represents filling of a large duodenal diverticulum with adjacent pancreatic tissue as opposed to a pancreatic abnormality. Spleen: Normal in size without focal abnormality. Adrenals/Urinary Tract: Adrenal glands are within normal limits. Kidneys are well visualized bilaterally with tiny nonobstructing renal stones. No obstructive changes are seen. The bladder is well distended. Stomach/Bowel: There is evidence of fecal impaction within the rectum. Diverticular change without evidence of diverticulitis is seen. No obstructive or inflammatory changes of the colon are seen. The small bowel is otherwise within limits. Stomach is within normal limits. Vascular/Lymphatic: Aortic atherosclerosis. No enlarged abdominal or pelvic lymph nodes. Reproductive: Uterus and bilateral adnexa are unremarkable. Other: No  abdominal wall hernia or abnormality. No abdominopelvic ascites. Musculoskeletal: Degenerative changes of the lumbar spine are noted. Changes of prior vertebral augmentation seen at T12. IMPRESSION: Changes consistent with fecal impaction within the rectum. 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 No other focal abnormality is noted. Electronically Signed   By: Inez Catalina M.D.   On: 10/06/2019 01:41   DG Chest 1 View  Result Date: 10/06/2019 CLINICAL DATA:  Pain EXAM: CHEST  1 VIEW COMPARISON:  09/27/2019 FINDINGS: Cardiac shadow is enlarged but stable. Aortic calcifications are again seen. The lungs are well aerated bilaterally. No focal infiltrate or sizable effusion is seen. Clearing of previously seen left basilar infiltrate is noted. No bony abnormality is noted. Changes of prior vertebral augmentation are seen. IMPRESSION: No acute abnormality noted. Electronically Signed   By: Inez Catalina M.D.   On: 10/06/2019 01:25   DG Femur Min 2 Views Left  Result Date: 10/06/2019 CLINICAL DATA:  Left leg bruising EXAM: LEFT FEMUR 2 VIEWS COMPARISON:  None. FINDINGS: Left knee prosthesis is seen. No definitive acute fracture or dislocation is seen. No soft tissue abnormality is noted. IMPRESSION: No acute abnormality noted. Electronically Signed   By: Inez Catalina M.D.   On: 10/06/2019 01:27    Scheduled Meds: . apixaban  5 mg Oral BID  . polyethylene glycol  17 g Oral Daily   Continuous Infusions: . meropenem (MERREM) IV       LOS: 0 days   Marylu Lund, MD [facility] Pager On Amion  If 7PM-7AM, please contact night-coverage 10/06/2019, 2:36 PM

## 2019-10-06 NOTE — H&P (Addendum)
TRH H&P    Patient Demographics:    Krystal Mckenzie, is a 86 y.o. female  MRN: [id number]  DOB - Jan 24, 1927  Admit Date - 10/05/2019  Referring MD/NP/PA: Thayer Jew  Outpatient Primary MD for the patient is Patient, No Pcp Per  Patient coming from:  [place]  Chief complaint-  Constipation,    HPI:    Krystal Mckenzie  is a 86 y.o. female,  chronic diastolic CHF, hypertension, hypothyroidism, chronic kidney disease, covid-19 positive w recent admission 09/20/19, presents with constipation, lack of BM x 5 days and found to have UTI.  Pt is a poor historian and unable to provide much history.   In Ed,    CT abd/ pelvis IMPRESSION: Changes consistent with fecal impaction within the rectum.  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  No other focal abnormality is noted.  CXR IMPRESSION: No acute abnormality noted.  Na 137, K 4.0, Bun 67, Creatinine 1.93 Alb 2.4 Ast 17, Alt 24 Wbc 12.5, Hgb 8.4, , Plt 197 Urinalysis wbc >50, rbc 0-5 Lactic acid 1.2,  Blood culture x2 pending   Pt will be admitted for fecal impaction and acute lower UTI    Review of systems:    In addition to the HPI above,  No Fever-chills, No Headache, No changes with Vision or hearing, No problems swallowing food or Liquids, No Chest pain, Cough or Shortness of Breath, No Abdominal pain, No Nausea or Vomiting, bowel movements are regular, No Blood in stool or Urine, No dysuria, No new skin rashes or bruises, No new joints pains-aches,  No new weakness, tingling, numbness in any extremity, No recent weight gain or loss, No polyuria, polydypsia or polyphagia, No significant Mental Stressors.  All other systems reviewed and are negative.    Past History of the following :    Past Medical History:  Diagnosis  Date  . CHF (congestive heart failure) ([place])   . GERD (gastroesophageal reflux disease)   . Hypertension   . Hypothyroid   . Renal disorder    Stage 4      Past Surgical History:  Procedure Laterality Date  . JOINT REPLACEMENT     Left knee  . REPLACEMENT TOTAL KNEE        Social History:      Social History   Tobacco Use  . Smoking status: Never Smoker  . Smokeless tobacco: Never Used  Substance Use Topics  . Alcohol use: Never       Family History :    No family history on file.  unable to obtain from patient    Home Medications:   Prior to Admission medications   Medication Sig Start Date End Date Taking? Authorizing Provider  acetaminophen (TYLENOL) 325 MG tablet Take 650 mg by mouth every 4 (four) hours as needed for fever.   Yes [provider]  amLODipine (NORVASC) 5 MG tablet Take 5 mg by mouth daily. 09/15/19  Yes [provider]  apixaban (ELIQUIS) 5 MG TABS tablet Take 1 tablet (5 mg total) by mouth 2 (two) times daily. 09/28/19  Yes Gherghe, Vella Redhead, MD  AZO-CRANBERRY PO Take 1 tablet by mouth 2 (two) times daily. AZO cranberry + Probiotic 250mg -30mg -50 million cell   Yes [provider]  bisacodyl (DULCOLAX) 10 MG suppository Place 10 mg rectally every 12 (twelve) hours as needed for moderate constipation. x3days started 2.1.21   Yes [provider]  Cholecalciferol (VITAMIN D3) 50 MCG (2000 UT) TABS Take 50 mcg by mouth daily.   Yes [provider]  clindamycin (CLEOCIN) 150 MG capsule Take 600 mg by mouth See admin instructions. 600 mg PO x1 one hour prior to all dental appointments   Yes [provider]  dexamethasone (DECADRON) 6 MG tablet Take 1 tablet (6 mg total) by mouth daily. 09/28/19  Yes Caren Griffins, MD  Febuxostat 80 MG TABS Take 80 mg by mouth daily. 09/15/19  Yes [provider]  hydrALAZINE (APRESOLINE) 25 MG tablet Take 25 mg by mouth 3 (three) times daily.   Yes [provider]  HYDROcodone-acetaminophen (NORCO/VICODIN) 5-325 MG tablet Take 1 tablet by mouth 3 (three) times daily. 09/28/19  Yes Gherghe, Vella Redhead, MD  isosorbide dinitrate (ISORDIL) 20 MG tablet Take 40 mg by mouth 2 (two) times daily. 800 and 2130 08/07/19  Yes [provider]  isosorbide dinitrate (ISORDIL) 20 MG tablet Take 20 mg by mouth daily. Midday (1330) 05/20/19  Yes [provider]  lactobacillus acidophilus (BACID) TABS tablet Take 2 tablets by mouth 2 (two) times daily with a meal.   Yes [provider]  levothyroxine (SYNTHROID, LEVOTHROID) 75 MCG tablet Take 75 mcg by mouth daily.     Yes [provider]  LORazepam (ATIVAN) 0.5 MG tablet Take 1 tablet (0.5 mg total) by mouth daily. 09/28/19  Yes Caren Griffins, MD  metoprolol succinate (TOPROL-XL) 25 MG 24 hr tablet Take 25 mg by mouth daily.   Yes [provider]  multivitamin-iron-minerals-folic acid (CENTRUM) chewable tablet Chew 1 tablet by mouth daily.   Yes [provider]  neomycin-polymyxin b-dexamethasone (MAXITROL) 3.5-10000-0.1 OINT Place 1 application into the right eye at bedtime as needed (eye swelling, eye infection).   Yes [provider]  omeprazole (PRILOSEC) 20 MG capsule Take 20 mg by mouth daily. 08/17/19  Yes [provider]  ondansetron (ZOFRAN-ODT) 4 MG disintegrating tablet Take 4 mg by mouth every 8 (eight) hours as needed for nausea or vomiting.   Yes [provider]  phenylephrine-shark liver oil-mineral oil-petrolatum (PREPARATION H) 0.25-14-74.9 % rectal ointment Place 1 application rectally 4 (four) times daily as needed for hemorrhoids.   Yes [provider]  polyethylene glycol (MIRALAX / GLYCOLAX) 17 g packet Take 17 g by mouth daily as needed.   Yes [provider]  potassium chloride (KLOR-CON) 10 MEQ tablet Take 10 mEq by mouth daily.   Yes [provider]  senna-docusate (SENNA S) 8.6-50  MG tablet Take 1 tablet by mouth 2 (two) times daily.   Yes [provider]  torsemide (DEMADEX) 10 MG tablet Take 10 mg by mouth See admin instructions. 10 mg PO five times weekly (Tuesdays, Wednesdays, Fridays, Saturdays, and Sundays) 08/17/19  Yes [provider]  torsemide (DEMADEX) 20 MG tablet Take 20 mg by mouth 2 (two) times a week. On Mondays and Thursdays 09/15/19  Yes [provider]  traZODone (DESYREL) 50 MG tablet Take 1  tablet (50 mg total) by mouth at bedtime. 09/28/19  Yes Gherghe, Vella Redhead, MD  lactulose (CHRONULAC) 10 GM/15ML solution Take 20 g by mouth once.    [provider]     Allergies:     Allergies  Allergen Reactions  . Morphine And Related   . Cephalexin Hives, Swelling and Rash  . Penicillins Swelling and Rash    Did it involve swelling of the face/tongue/throat, SOB, or low BP? Unknown Did it involve sudden or severe rash/hives, skin peeling, or any reaction on the inside of your mouth or nose? Unknown Did you need to seek medical attention at a hospital or doctor's office? Unknown When did it last happen? Unknown If all above answers are "NO", may proceed with cephalosporin use.      Physical Exam:   Vitals  Blood pressure 117/60, pulse 84, temperature 98.5 F (36.9 C), temperature source Oral, resp. rate 16, SpO2 98 %.  1.  General: axoxo2 (person, hospital), might be due to her HOH  2. Psychiatric: euthymic  3. Neurologic: nonfocal  4. HEENMT:  Anicteric, pupils 1.73mm symmetric, direct, consensual intact Neck: no jvd  5. Respiratory : CTAB  6. Cardiovascular : rrr s1, s2,   7. Gastrointestinal:  Abd: soft, nt, nd, +bs  8. Skin:  Ext: no c/c/e, no rash  9.Musculoskeletal:  Good ROM    Data Review:    CBC Recent Labs  Lab 10/06/19 0050  WBC 12.5*  HGB 8.4*  HCT 26.9*  PLT 197  MCV 100.4*  MCH 31.3  MCHC 31.2  RDW 15.9*  LYMPHSABS 0.4*  MONOABS 0.6  EOSABS 0.2  BASOSABS 0.0     ------------------------------------------------------------------------------------------------------------------  Results for orders placed or performed during the hospital encounter of 10/05/19 (from the past 48 hour(s))  Comprehensive metabolic panel     Status: Abnormal   Collection Time: 10/06/19 12:50 AM  Result Value Ref Range   Sodium 137 135 - 145 mmol/L   Potassium 4.0 3.5 - 5.1 mmol/L   Chloride 102 98 - 111 mmol/L   CO2 24 22 - 32 mmol/L   Glucose, Bld 124 (H) 70 - 99 mg/dL   BUN 67 (H) 8 - 23 mg/dL   Creatinine, Ser 1.93 (H) 0.44 - 1.00 mg/dL   Calcium 8.2 (L) 8.9 - 10.3 mg/dL   Total Protein 6.0 (L) 6.5 - 8.1 g/dL   Albumin 2.4 (L) 3.5 - 5.0 g/dL   AST 17 15 - 41 U/L   ALT 24 0 - 44 U/L   Alkaline Phosphatase 67 38 - 126 U/L   Total Bilirubin 0.7 0.3 - 1.2 mg/dL   GFR calc non Af Amer 22 (L) >60 mL/min   GFR calc Af Amer 26 (L) >60 mL/min   Anion gap 11 5 - 15    Comment: Performed at [facility], [place] [address], [place], [place] [address]  CBC with Differential     Status: Abnormal   Collection Time: 10/06/19 12:50 AM  Result Value Ref Range   WBC 12.5 (H) 4.0 - 10.5 K/uL   RBC 2.68 (L) 3.87 - 5.11 MIL/uL   Hemoglobin 8.4 (L) 12.0 - 15.0 g/dL   HCT 26.9 (L) 36.0 - 46.0 %   MCV 100.4 (H) 80.0 - 100.0 fL   MCH 31.3 26.0 - 34.0 pg   MCHC 31.2 30.0 - 36.0 g/dL   RDW 15.9 (H) 11.5 - 15.5 %   Platelets 197 150 - 400 K/uL  nRBC 0.0 0.0 - 0.2 %   Neutrophils Relative % 89 %   Neutro Abs 11.1 (H) 1.7 - 7.7 K/uL   Lymphocytes Relative 3 %   Lymphs Abs 0.4 (L) 0.7 - 4.0 K/uL   Monocytes Relative 5 %   Monocytes Absolute 0.6 0.1 - 1.0 K/uL   Eosinophils Relative 1 %   Eosinophils Absolute 0.2 0.0 - 0.5 K/uL   Basophils Relative 0 %   Basophils Absolute 0.0 0.0 - 0.1 K/uL   Immature Granulocytes 2 %   Abs Immature Granulocytes 0.24 (H) 0.00 - 0.07 K/uL    Comment: Performed at [facility], [place] [address],  [place], [place] [address]  Lactic acid, plasma     Status: None   Collection Time: 10/06/19 12:54 AM  Result Value Ref Range   Lactic Acid, Venous 1.2 0.5 - 1.9 mmol/L    Comment: Performed at [facility], [place] [address], [place], [place] [address]  Urinalysis, Routine w reflex microscopic     Status: Abnormal   Collection Time: 10/06/19  1:48 AM  Result Value Ref Range   Color, Urine YELLOW YELLOW   APPearance CLEAR CLEAR   Specific Gravity, Urine 1.009 1.005 - 1.030   pH 5.0 5.0 - 8.0   Glucose, UA NEGATIVE NEGATIVE mg/dL   Hgb urine dipstick NEGATIVE NEGATIVE   Bilirubin Urine NEGATIVE NEGATIVE   Ketones, ur NEGATIVE NEGATIVE mg/dL   Protein, ur NEGATIVE NEGATIVE mg/dL   Nitrite POSITIVE (A) NEGATIVE   Leukocytes,Ua LARGE (A) NEGATIVE   RBC / HPF 0-5 0 - 5 RBC/hpf   WBC, UA >50 (H) 0 - 5 WBC/hpf   Bacteria, UA RARE (A) NONE SEEN   Mucus PRESENT    Hyaline Casts, UA PRESENT     Comment: Performed at [facility], [address] [place], [place] [address]    Chemistries  Recent Labs  Lab 10/06/19 0050  NA 137  K 4.0  CL 102  CO2 24  GLUCOSE 124*  BUN 67*  CREATININE 1.93*  CALCIUM 8.2*  AST 17  ALT 24  ALKPHOS 67  BILITOT 0.7   ------------------------------------------------------------------------------------------------------------------  ------------------------------------------------------------------------------------------------------------------ GFR: Estimated Creatinine Clearance: 18.2 mL/min (A) (by C-G formula based on SCr of 1.93 mg/dL (H)). Liver Function Tests: Recent Labs  Lab 10/06/19 0050  AST 17  ALT 24  ALKPHOS 67  BILITOT 0.7  PROT 6.0*  ALBUMIN 2.4*   No results for input(s): LIPASE, AMYLASE in the last 168 hours. No results for input(s): AMMONIA in the last 168 hours. Coagulation Profile: No results for input(s): INR, PROTIME in the last 168 hours. Cardiac Enzymes: No results for input(s):  CKTOTAL, CKMB, CKMBINDEX, TROPONINI in the last 168 hours. BNP (last 3 results) No results for input(s): PROBNP in the last 8760 hours. HbA1C: No results for input(s): HGBA1C in the last 72 hours. CBG: No results for input(s): GLUCAP in the last 168 hours. Lipid Profile: No results for input(s): CHOL, HDL, LDLCALC, TRIG, CHOLHDL, LDLDIRECT in the last 72 hours. Thyroid Function Tests: No results for input(s): TSH, T4TOTAL, FREET4, T3FREE, THYROIDAB in the last 72 hours. Anemia Panel: No results for input(s): VITAMINB12, FOLATE, FERRITIN, TIBC, IRON, RETICCTPCT in the last 72 hours.  --------------------------------------------------------------------------------------------------------------- Urine analysis:    Component Value Date/Time   COLORURINE YELLOW 10/06/2019 0148   APPEARANCEUR CLEAR 10/06/2019 0148   LABSPEC 1.009 10/06/2019 0148   PHURINE 5.0 10/06/2019 0148   GLUCOSEU NEGATIVE 10/06/2019 0148  HGBUR NEGATIVE 10/06/2019 0148   BILIRUBINUR NEGATIVE 10/06/2019 0148   KETONESUR NEGATIVE 10/06/2019 0148   PROTEINUR NEGATIVE 10/06/2019 0148   UROBILINOGEN 0.2 05/09/2011 1918   NITRITE POSITIVE (A) 10/06/2019 0148   LEUKOCYTESUR LARGE (A) 10/06/2019 0148      Imaging Results:    CT Abdomen Pelvis Wo Contrast  Result Date: 10/06/2019 CLINICAL DATA:  Abdominal distension EXAM: CT ABDOMEN AND PELVIS WITHOUT CONTRAST TECHNIQUE: Multidetector CT imaging of the abdomen and pelvis was performed following the standard protocol without IV contrast. COMPARISON:  09/24/2019 FINDINGS: Lower chest: Bibasilar atelectatic changes are noted improved from the prior exam. Previously seen effusions have resolved. Hepatobiliary: No focal liver abnormality is seen. No gallstones, gallbladder wall thickening, or biliary dilatation. Pancreas: Pancreas is well visualized in the tail and body. In the region of the head of the pancreas there is significant increased density identified which is new  from the prior exam from 10 days previous. This likely represents filling of a large duodenal diverticulum with adjacent pancreatic tissue as opposed to a pancreatic abnormality. Spleen: Normal in size without focal abnormality. Adrenals/Urinary Tract: Adrenal glands are within normal limits. Kidneys are well visualized bilaterally with tiny nonobstructing renal stones. No obstructive changes are seen. The bladder is well distended. Stomach/Bowel: There is evidence of fecal impaction within the rectum. Diverticular change without evidence of diverticulitis is seen. No obstructive or inflammatory changes of the colon are seen. The small bowel is otherwise within limits. Stomach is within normal limits. Vascular/Lymphatic: Aortic atherosclerosis. No enlarged abdominal or pelvic lymph nodes. Reproductive: Uterus and bilateral adnexa are unremarkable. Other: No abdominal wall hernia or abnormality. No abdominopelvic ascites. Musculoskeletal: Degenerative changes of the lumbar spine are noted. Changes of prior vertebral augmentation seen at T12. IMPRESSION: Changes consistent with fecal impaction within the rectum. 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 No other focal abnormality is noted. Electronically Signed   By: Inez Catalina M.D.   On: 10/06/2019 01:41   DG Chest 1 View  Result Date: 10/06/2019 CLINICAL DATA:  Pain EXAM: CHEST  1 VIEW COMPARISON:  09/27/2019 FINDINGS: Cardiac shadow is enlarged but stable. Aortic calcifications are again seen. The lungs are well aerated bilaterally. No focal infiltrate or sizable effusion is seen. Clearing of previously seen left basilar infiltrate is noted. No bony abnormality is noted. Changes of prior vertebral augmentation are seen. IMPRESSION: No acute abnormality noted. Electronically Signed   By: Inez Catalina M.D.   On: 10/06/2019 01:25   DG Femur Min 2 Views Left  Result  Date: 10/06/2019 CLINICAL DATA:  Left leg bruising EXAM: LEFT FEMUR 2 VIEWS COMPARISON:  None. FINDINGS: Left knee prosthesis is seen. No definitive acute fracture or dislocation is seen. No soft tissue abnormality is noted. IMPRESSION: No acute abnormality noted. Electronically Signed   By: Inez Catalina M.D.   On: 10/06/2019 01:27       Assessment & Plan:    Principal Problem:   Fecal impaction (HCC) Active Problems:   Hypothyroid   Chronic diastolic CHF (congestive heart failure) (HCC)   Acute lower UTI   CKD (chronic kidney disease), stage III  Fecal impaction Fleet enema received in ED miralax 17gm po qday and prn  Senna S 1 po bid Cont dulcolax prn  Monitor  Acute lower UTI Urine culture pending Meropenem iv pharmacy to dose  Presumed PE, see discharge summary from prior admission Cont Eliquis  x 19months  Hypothyroidism Cont Levothyroxine  Chronic diastolic CHF Hypertension Cont Amlodipine 5mg  po qday Cont Hydralazine 25mg  op tid Cont Isordil 20mg  po qday Cont Toprol XL 25mg  po qday Cont Torsemide 20mg  po 2x per week Monday, Thursday, 10mg  po qday other days.      DVT Prophylaxis-   Eliquis SCDs  AM Labs Ordered, also please review Full Orders  Family Communication: Admission, patients condition and plan of care including tests being ordered have been discussed with the patient who indicate understanding and agree with the plan and Code Status.  Code Status:  DNR per yellow ticket, notified daughter of admission to [facility]  Admission status: Observation: Based on patients clinical presentation and evaluation of above clinical data, I have made determination that patient meets Observation criteria at this time.     Time spent in minutes : 55 minutes   Jani Gravel M.D on 10/06/2019 at 6:18 AM

## 2019-10-06 NOTE — Progress Notes (Signed)
A consult was received from an ED physician for meropenem per pharmacy dosing.  The patient's profile has been reviewed for ht/wt/allergies/indication/available labs.   A one time order has been placed for Meropenem 1 Gm .  Further antibiotics/pharmacy consults should be ordered by admitting physician if indicated.                       Thank you, Dorrene German 10/06/2019  3:36 AM

## 2019-10-06 NOTE — ED Provider Notes (Signed)
Patient signed out pending work-up including labs and CT scan.   Work-up notable for nitrite positive urine with greater than 50 white cells and rare bacteria.  She was treated for UTI while in the hospital recently.  She has recently been on Bactrim, Macrobid, and was treated with meropenem and fosfomycin while in the hospital.  Urinalysis and urine culture from 1/17 was not nitrite positive and culture showed less than 10,000 colonies.  Will repeat culture.  Lactate is normal.  No significant white count.  CT scan shows fecal impaction.  Rectal exam was done by prior provider and reported to have soft stool in the vault.  I have ordered an enema.  Per nursing, only able to do half of an enema because it was leaking out so much.  I have reviewed the CT scan independently and she has a large high riding fecal impaction.  Reportedly was given lactulose, MiraLAX, at her living facility with no results.  Also notably on her labs she is more anemic with a hemoglobin of 8.4, down from baseline of 10.  Given patient's age, evidence of UTI likely resistant, and fecal impaction that has not improved with outpatient measures, feel she would benefit from admission for bowel cleanout and IV antibiotics for UTI.  Will discuss with admitting hospitalist regarding antibiotic choice.  Results for orders placed or performed during the hospital encounter of 10/05/19  Comprehensive metabolic panel  Result Value Ref Range   Sodium 137 135 - 145 mmol/L   Potassium 4.0 3.5 - 5.1 mmol/L   Chloride 102 98 - 111 mmol/L   CO2 24 22 - 32 mmol/L   Glucose, Bld 124 (H) 70 - 99 mg/dL   BUN 67 (H) 8 - 23 mg/dL   Creatinine, Ser 1.93 (H) 0.44 - 1.00 mg/dL   Calcium 8.2 (L) 8.9 - 10.3 mg/dL   Total Protein 6.0 (L) 6.5 - 8.1 g/dL   Albumin 2.4 (L) 3.5 - 5.0 g/dL   AST 17 15 - 41 U/L   ALT 24 0 - 44 U/L   Alkaline Phosphatase 67 38 - 126 U/L   Total Bilirubin 0.7 0.3 - 1.2 mg/dL   GFR calc non Af Amer 22 (L) >60 mL/min   GFR  calc Af Amer 26 (L) >60 mL/min   Anion gap 11 5 - 15  CBC with Differential  Result Value Ref Range   WBC 12.5 (H) 4.0 - 10.5 K/uL   RBC 2.68 (L) 3.87 - 5.11 MIL/uL   Hemoglobin 8.4 (L) 12.0 - 15.0 g/dL   HCT 26.9 (L) 36.0 - 46.0 %   MCV 100.4 (H) 80.0 - 100.0 fL   MCH 31.3 26.0 - 34.0 pg   MCHC 31.2 30.0 - 36.0 g/dL   RDW 15.9 (H) 11.5 - 15.5 %   Platelets 197 150 - 400 K/uL   nRBC 0.0 0.0 - 0.2 %   Neutrophils Relative % 89 %   Neutro Abs 11.1 (H) 1.7 - 7.7 K/uL   Lymphocytes Relative 3 %   Lymphs Abs 0.4 (L) 0.7 - 4.0 K/uL   Monocytes Relative 5 %   Monocytes Absolute 0.6 0.1 - 1.0 K/uL   Eosinophils Relative 1 %   Eosinophils Absolute 0.2 0.0 - 0.5 K/uL   Basophils Relative 0 %   Basophils Absolute 0.0 0.0 - 0.1 K/uL   Immature Granulocytes 2 %   Abs Immature Granulocytes 0.24 (H) 0.00 - 0.07 K/uL  Urinalysis, Routine w reflex microscopic  Result  Value Ref Range   Color, Urine YELLOW YELLOW   APPearance CLEAR CLEAR   Specific Gravity, Urine 1.009 1.005 - 1.030   pH 5.0 5.0 - 8.0   Glucose, UA NEGATIVE NEGATIVE mg/dL   Hgb urine dipstick NEGATIVE NEGATIVE   Bilirubin Urine NEGATIVE NEGATIVE   Ketones, ur NEGATIVE NEGATIVE mg/dL   Protein, ur NEGATIVE NEGATIVE mg/dL   Nitrite POSITIVE (A) NEGATIVE   Leukocytes,Ua LARGE (A) NEGATIVE   RBC / HPF 0-5 0 - 5 RBC/hpf   WBC, UA >50 (H) 0 - 5 WBC/hpf   Bacteria, UA RARE (A) NONE SEEN   Mucus PRESENT    Hyaline Casts, UA PRESENT   Lactic acid, plasma  Result Value Ref Range   Lactic Acid, Venous 1.2 0.5 - 1.9 mmol/L   CT Abdomen Pelvis Wo Contrast  Result Date: 10/06/2019 CLINICAL DATA:  Abdominal distension EXAM: CT ABDOMEN AND PELVIS WITHOUT CONTRAST TECHNIQUE: Multidetector CT imaging of the abdomen and pelvis was performed following the standard protocol without IV contrast. COMPARISON:  09/24/2019 FINDINGS: Lower chest: Bibasilar atelectatic changes are noted improved from the prior exam. Previously seen effusions have  resolved. Hepatobiliary: No focal liver abnormality is seen. No gallstones, gallbladder wall thickening, or biliary dilatation. Pancreas: Pancreas is well visualized in the tail and body. In the region of the head of the pancreas there is significant increased density identified which is new from the prior exam from 10 days previous. This likely represents filling of a large duodenal diverticulum with adjacent pancreatic tissue as opposed to a pancreatic abnormality. Spleen: Normal in size without focal abnormality. Adrenals/Urinary Tract: Adrenal glands are within normal limits. Kidneys are well visualized bilaterally with tiny nonobstructing renal stones. No obstructive changes are seen. The bladder is well distended. Stomach/Bowel: There is evidence of fecal impaction within the rectum. Diverticular change without evidence of diverticulitis is seen. No obstructive or inflammatory changes of the colon are seen. The small bowel is otherwise within limits. Stomach is within normal limits. Vascular/Lymphatic: Aortic atherosclerosis. No enlarged abdominal or pelvic lymph nodes. Reproductive: Uterus and bilateral adnexa are unremarkable. Other: No abdominal wall hernia or abnormality. No abdominopelvic ascites. Musculoskeletal: Degenerative changes of the lumbar spine are noted. Changes of prior vertebral augmentation seen at T12. IMPRESSION: Changes consistent with fecal impaction within the rectum. 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 No other focal abnormality is noted. Electronically Signed   By: Inez Catalina M.D.   On: 10/06/2019 01:41   CT ABDOMEN PELVIS WO CONTRAST  Result Date: 09/24/2019 CLINICAL DATA:  Lower abdominal and pelvic pain. EXAM: CT ABDOMEN AND PELVIS WITHOUT CONTRAST TECHNIQUE: Multidetector CT imaging of the abdomen and pelvis was performed following the standard protocol without IV contrast.  COMPARISON:  CT scan 05/10/2011 FINDINGS: Exam significantly limited by patient motion. Lower chest: Small bilateral pleural effusions and bibasilar infiltrates. The heart is mildly enlarged. Stable vascular calcifications. Hepatobiliary: No focal hepatic lesions or intrahepatic biliary dilatation. Gallbladder is grossly normal. No obvious common bile duct dilatation. Pancreas: Very poorly visualized due to motion artifact. No gross abnormality. Spleen: Normal size. Adrenals/Urinary Tract: No obvious renal abnormality. No hydronephrosis or renal calculi. The left kidney is small. The bladder is grossly normal. Mild bladder distention. No bladder mass or calculi. Stomach/Bowel: Stomach, duodenum, small bowel and colon are grossly normal but exam quite limited by motion artifact. No obvious acute obstructive findings or mass lesions. Colonic diverticulosis  without definite findings for acute diverticulitis. Vascular/Lymphatic: Advanced vascular calcifications. No definite aneurysm. No mesenteric or retroperitoneal mass or adenopathy is identified. Reproductive: The uterus and ovaries are unremarkable. Other: No pelvic mass or pelvic lymphadenopathy. No free pelvic fluid collections. No inguinal mass. Small left inguinal hernia containing fat. Diffuse bulging anterior abdominal wall but no discrete hernia. Musculoskeletal: Scoliosis and degenerative lumbar spondylosis. Remote vertebral augmentation changes noted at T12. No acute bony findings or destructive bony changes. IMPRESSION: 1. Very limited examination due to motion artifact. 2. Small bilateral pleural effusions and bibasilar infiltrates. 3. No obvious acute abdominal/pelvic findings, mass lesions or adenopathy. Electronically Signed   By: Marijo Sanes M.D.   On: 09/24/2019 17:02   DG Chest 1 View  Result Date: 10/06/2019 CLINICAL DATA:  Pain EXAM: CHEST  1 VIEW COMPARISON:  09/27/2019 FINDINGS: Cardiac shadow is enlarged but stable. Aortic calcifications  are again seen. The lungs are well aerated bilaterally. No focal infiltrate or sizable effusion is seen. Clearing of previously seen left basilar infiltrate is noted. No bony abnormality is noted. Changes of prior vertebral augmentation are seen. IMPRESSION: No acute abnormality noted. Electronically Signed   By: Inez Catalina M.D.   On: 10/06/2019 01:25   US RENAL  Result Date: 09/22/2019 CLINICAL DATA:  Acute renal failure EXAM: RENAL / URINARY TRACT ULTRASOUND COMPLETE COMPARISON:  CT 05/10/2011 FINDINGS: Right Kidney: Renal measurements: 8.2 x 3.8 x 3.9 cm = volume: 64.3 mL. Echogenic cortex. No mass or hydronephrosis Left Kidney: Renal measurements: 7.8 x 3.3 x 3.4 cm = volume: 45.3 mL. Echogenic cortex. No mass or hydronephrosis. Bladder: Appears normal for degree of bladder distention. Other: None. IMPRESSION: Small echogenic kidneys consistent with medical renal disease and mild atrophy. No hydronephrosis. Electronically Signed   By: Donavan Foil M.D.   On: 09/22/2019 19:19   DG CHEST PORT 1 VIEW  Result Date: 09/27/2019 CLINICAL DATA:  Hypoxemia, COVID-19 EXAM: PORTABLE CHEST 1 VIEW COMPARISON:  Portable exam 1046 hours compared to 09/20/2019 FINDINGS: Upper normal heart size. Atherosclerotic calcification aorta. Mediastinal contours and pulmonary vascularity otherwise normal. Small LEFT pleural effusion with atelectasis versus infiltrate LEFT lower lobe, minimally improved. Remaining lungs clear. No pneumothorax. Bones demineralized. IMPRESSION: Small LEFT pleural effusion with slightly improved infiltrate versus atelectasis LEFT lower lobe. Electronically Signed   By: Lavonia Dana M.D.   On: 09/27/2019 10:54   DG Chest Port 1 View  Result Date: 09/20/2019 CLINICAL DATA:  Shortness of breath, COVID-19 positive EXAM: PORTABLE CHEST 1 VIEW COMPARISON:  Radiograph 05/09/2011 FINDINGS: Patchy right basilar and dense retrocardiac opacities. Obscuration of left hemidiaphragm may reflect basilar  consolidation or effusion. No visible pneumothorax. Biapical pleuroparenchymal scarring is seen. There is some cephalized vascularity with hazy interstitial opacities suggesting pulmonary edema. Tracheal tortuosity is similar to prior. The aorta is calcified. The remaining cardiomediastinal contours are unremarkable. No acute osseous or soft tissue abnormality. Degenerative changes are present in the imaged spine and shoulders. Telemetry leads and support devices overlie the chest. IMPRESSION: 1. Bibasilar opacities, left greater than right, worrisome for pneumonia in the setting of COVID-19. 2. Pulmonary vascular congestion and hazy interstitial opacities suggesting some concomitant edema. 3. Biapical pleuroparenchymal scarring. 4.  Aortic Atherosclerosis (ICD10-I70.0). Electronically Signed   By: Lovena Le M.D.   On: 09/20/2019 21:06   DG Femur Min 2 Views Left  Result Date: 10/06/2019 CLINICAL DATA:  Left leg bruising EXAM: LEFT FEMUR 2 VIEWS COMPARISON:  None. FINDINGS: Left knee prosthesis is seen. No definitive  acute fracture or dislocation is seen. No soft tissue abnormality is noted. IMPRESSION: No acute abnormality noted. Electronically Signed   By: Inez Catalina M.D.   On: 10/06/2019 01:27     Physical Exam  BP 138/64   Pulse 84   Temp 98.5 F (36.9 C) (Oral)   Resp 16   SpO2 97%     ED Course/Procedures    Problem List Items Addressed This Visit    None    Visit Diagnoses    Generalized abdominal pain    -  Primary   Pain       Relevant Orders   DG Chest 1 View (Completed)   Fecal impaction (HCC)       Anemia, unspecified type       Urinary tract infection without hematuria, site unspecified               Merryl Hacker, MD 10/06/19 [phone]

## 2019-10-06 NOTE — ED Notes (Signed)
Pt transported to radiology.

## 2019-10-06 NOTE — Progress Notes (Signed)
Pharmacy Antibiotic Note  Krystal Mckenzie is a 86 y.o. female admitted on 10/05/2019 with UTI.  Pharmacy has been consulted for meropenem dosing.  Plan: Meropenem 1 Gm x1 then 500 mg IV q12h F/u scr/cultures Apixaban 5 mg bid as on PTA for presumed PE      Temp (24hrs), Avg:98.5 F (36.9 C), Min:98.5 F (36.9 C), Max:98.5 F (36.9 C)  Recent Labs  Lab 10/06/19 0050 10/06/19 0054 10/06/19 0604  WBC 12.5*  --  11.4*  CREATININE 1.93*  --  1.89*  LATICACIDVEN  --  1.2  --     Estimated Creatinine Clearance: 18.6 mL/min (A) (by C-G formula based on SCr of 1.89 mg/dL (H)).    Allergies  Allergen Reactions  . Morphine And Related   . Cephalexin Hives, Swelling and Rash  . Penicillins Swelling and Rash    Did it involve swelling of the face/tongue/throat, SOB, or low BP? Unknown Did it involve sudden or severe rash/hives, skin peeling, or any reaction on the inside of your mouth or nose? Unknown Did you need to seek medical attention at a hospital or doctor's office? Unknown When did it last happen? Unknown If all above answers are "NO", may proceed with cephalosporin use.     Antimicrobials this admission: 2/2 meropenem >>    >>   Dose adjustments this admission:   Microbiology results:  BCx:   UCx:    Sputum:    MRSA PCR:  Thank you for allowing pharmacy to be a part of this patient's care.  Dorrene German 10/06/2019 6:39 AM

## 2019-10-06 NOTE — Progress Notes (Signed)
Patient was being turned side to side for linen, peri care after having a BM

## 2019-10-07 DIAGNOSIS — F0390 Unspecified dementia without behavioral disturbance: Secondary | ICD-10-CM | POA: Diagnosis present

## 2019-10-07 DIAGNOSIS — Z79890 Hormone replacement therapy (postmenopausal): Secondary | ICD-10-CM | POA: Diagnosis not present

## 2019-10-07 DIAGNOSIS — B965 Pseudomonas (aeruginosa) (mallei) (pseudomallei) as the cause of diseases classified elsewhere: Secondary | ICD-10-CM | POA: Diagnosis present

## 2019-10-07 DIAGNOSIS — Z79891 Long term (current) use of opiate analgesic: Secondary | ICD-10-CM | POA: Diagnosis not present

## 2019-10-07 DIAGNOSIS — Z66 Do not resuscitate: Secondary | ICD-10-CM | POA: Diagnosis present

## 2019-10-07 DIAGNOSIS — Z79899 Other long term (current) drug therapy: Secondary | ICD-10-CM | POA: Diagnosis not present

## 2019-10-07 DIAGNOSIS — K5641 Fecal impaction: Secondary | ICD-10-CM | POA: Diagnosis present

## 2019-10-07 DIAGNOSIS — Z888 Allergy status to other drugs, medicaments and biological substances status: Secondary | ICD-10-CM | POA: Diagnosis not present

## 2019-10-07 DIAGNOSIS — Z7901 Long term (current) use of anticoagulants: Secondary | ICD-10-CM | POA: Diagnosis not present

## 2019-10-07 DIAGNOSIS — Z8619 Personal history of other infectious and parasitic diseases: Secondary | ICD-10-CM | POA: Diagnosis not present

## 2019-10-07 DIAGNOSIS — Z880 Allergy status to penicillin: Secondary | ICD-10-CM | POA: Diagnosis not present

## 2019-10-07 DIAGNOSIS — K219 Gastro-esophageal reflux disease without esophagitis: Secondary | ICD-10-CM | POA: Diagnosis present

## 2019-10-07 DIAGNOSIS — I5032 Chronic diastolic (congestive) heart failure: Secondary | ICD-10-CM | POA: Diagnosis present

## 2019-10-07 DIAGNOSIS — N390 Urinary tract infection, site not specified: Secondary | ICD-10-CM | POA: Diagnosis present

## 2019-10-07 DIAGNOSIS — N183 Chronic kidney disease, stage 3 unspecified: Secondary | ICD-10-CM | POA: Diagnosis not present

## 2019-10-07 DIAGNOSIS — Z96652 Presence of left artificial knee joint: Secondary | ICD-10-CM | POA: Diagnosis present

## 2019-10-07 DIAGNOSIS — D631 Anemia in chronic kidney disease: Secondary | ICD-10-CM | POA: Diagnosis present

## 2019-10-07 DIAGNOSIS — E039 Hypothyroidism, unspecified: Secondary | ICD-10-CM | POA: Diagnosis present

## 2019-10-07 DIAGNOSIS — Z885 Allergy status to narcotic agent status: Secondary | ICD-10-CM | POA: Diagnosis not present

## 2019-10-07 DIAGNOSIS — U071 COVID-19: Secondary | ICD-10-CM | POA: Diagnosis present

## 2019-10-07 DIAGNOSIS — N184 Chronic kidney disease, stage 4 (severe): Secondary | ICD-10-CM | POA: Diagnosis present

## 2019-10-07 DIAGNOSIS — I130 Hypertensive heart and chronic kidney disease with heart failure and stage 1 through stage 4 chronic kidney disease, or unspecified chronic kidney disease: Secondary | ICD-10-CM | POA: Diagnosis present

## 2019-10-07 LAB — CBC
HCT: 25.7 % — ABNORMAL LOW (ref 36.0–46.0)
Hemoglobin: 7.9 g/dL — ABNORMAL LOW (ref 12.0–15.0)
MCH: 31 pg (ref 26.0–34.0)
MCHC: 30.7 g/dL (ref 30.0–36.0)
MCV: 100.8 fL — ABNORMAL HIGH (ref 80.0–100.0)
Platelets: 194 10*3/uL (ref 150–400)
RBC: 2.55 MIL/uL — ABNORMAL LOW (ref 3.87–5.11)
RDW: 15.8 % — ABNORMAL HIGH (ref 11.5–15.5)
WBC: 11 10*3/uL — ABNORMAL HIGH (ref 4.0–10.5)
nRBC: 0 % (ref 0.0–0.2)

## 2019-10-07 LAB — BASIC METABOLIC PANEL
Anion gap: 7 (ref 5–15)
BUN: 59 mg/dL — ABNORMAL HIGH (ref 8–23)
CO2: 28 mmol/L (ref 22–32)
Calcium: 8.4 mg/dL — ABNORMAL LOW (ref 8.9–10.3)
Chloride: 103 mmol/L (ref 98–111)
Creatinine, Ser: 1.87 mg/dL — ABNORMAL HIGH (ref 0.44–1.00)
GFR calc Af Amer: 27 mL/min — ABNORMAL LOW (ref >60)
GFR calc non Af Amer: 23 mL/min — ABNORMAL LOW (ref >60)
Glucose, Bld: 106 mg/dL — ABNORMAL HIGH (ref 70–99)
Potassium: 4.4 mmol/L (ref 3.5–5.1)
Sodium: 138 mmol/L (ref 135–145)

## 2019-10-07 LAB — URINE CULTURE: Culture: 90000 — AB

## 2019-10-07 MED ORDER — FOSFOMYCIN TROMETHAMINE 3 G PO PACK
3.0000 g | PACK | Freq: Once | ORAL | Status: AC
  Administered 2019-10-07: 16:00:00 3 g via ORAL
  Filled 2019-10-07: qty 3

## 2019-10-07 NOTE — Progress Notes (Addendum)
PROGRESS NOTE    Krystal Mckenzie  [id number] DOB: 10/06/1926 DOA: 10/05/2019 PCP: Patient, No Pcp Per    Brief Narrative:  86 y.o. female,  chronic diastolic CHF, hypertension, hypothyroidism, chronic kidney disease, covid-19 positive w recent admission 09/20/19, presents with constipation, lack of BM x 5 days and found to have UTI.  Pt is a poor historian and unable to provide much history. In ED, CT abd/ pelvis showed changes consistent with fecal impaction within the rectum. No other focal abnormality is noted. Pt admitted for further management.  Assessment & Plan:   Principal Problem:   Fecal impaction (HCC) Active Problems:   Hypothyroid   Chronic diastolic CHF (congestive heart failure) (HCC)   Acute lower UTI   CKD (chronic kidney disease), stage III   Acute UTI   Fecal impaction Resolved with cathartics given on presentation Continue with cathartics as needed  Acute lower UTI 2/2 Pseudomonas aeruginosa UA showed positive nitrites, leukocytes Urine cx with 90,000 Pseudomonas aeruginosa Currently on meropenem, pharmacy recommending 1 dose of fosfomycin, will give on 10/07/19, d/c meropenem   Hx of COVID-19 infection Tested positive for Covid on 09/20/2019 Currently still requiring about 2 L of O2, plan to wean Monitor closely  Presumed PE, see discharge summary from prior admission Cont Eliquis x 58months  Hypothyroidism Cont Levothyroxine as tolerated  Chronic diastolic CHF Hypertension Cont Amlodipine 5mg  po qday, Hydralazine 25mg  op tid, Isordil 20mg  po qday, Toprol XL 25mg  po qday Continue on Torsemide 20mg  po 2x per week Monday, Thursday, 10mg  po qday other days.  CKD stage IV Creatinine at baseline Daily BMP  Anemia of chronic kidney disease Hemoglobin currently 7.9, baseline around  10-11 FOBT pending, patient on Eliquis Anemia panel pending Daily CBC     DVT prophylaxis: eliquis Code Status: DNR Family Communication: Pt in room, family  not at bedside Disposition Plan: Return to facility pending bed availability and clinical improvement as pt continues to yell in pain when touched around her BLE and lower abdomen. Constantly asking for pain meds  Consultants:   None  Procedures:   None  Antimicrobials: Anti-infectives (From admission, onward)   Start     Dose/Rate Route Frequency Ordered Stop   10/06/19 1800  meropenem (MERREM) 500 mg in sodium chloride 0.9 % 100 mL IVPB     500 mg 200 mL/hr over 30 Minutes Intravenous Every 12 hours 10/06/19 0647     10/06/19 0345  meropenem (MERREM) 1 g in sodium chloride 0.9 % 100 mL IVPB     1 g 200 mL/hr over 30 Minutes Intravenous  Once 10/06/19 0335 10/06/19 0436      Subjective: Continues to complain of pain around her lower extremities, lower abdomen.  Continues to constantly ask for pain meds.   Objective: Vitals:   10/06/19 2152 10/07/19 0500 10/07/19 0602 10/07/19 1409  BP: 129/62  (!) 132/51 116/82  Pulse: 96  91 86  Resp: 19  (!) 22 16  Temp: 99.8 F (37.7 C)  98.5 F (36.9 C) 98 F (36.7 C)  TempSrc: Oral  Oral Oral  SpO2: 97%  98% 99%  Weight:  72.3 kg      Intake/Output Summary (Last 24 hours) at 10/07/2019 1513 Last data filed at 10/07/2019 1253 Gross per 24 hour  Intake 1319.87 ml  Output 150 ml  Net 1169.87 ml   Filed Weights   10/07/19 0500  Weight: 72.3 kg    Examination:  General: NAD, tearful  Cardiovascular: S1, S2 present  Respiratory:  Diminished breath sounds bilaterally  Abdomen: Soft, +tender suprapubic region, nondistended, bowel sounds present  Musculoskeletal: No bilateral pedal edema noted  Skin: Normal  Psychiatry:  Unable to assess, tearful   Data Reviewed: I have personally reviewed following labs and imaging studies  CBC: Recent Labs  Lab 10/06/19 0050 10/06/19 0604 10/07/19 0532  WBC 12.5* 11.4* 11.0*  NEUTROABS 11.1*  --   --   HGB 8.4* 9.1* 7.9*  HCT 26.9* 29.9* 25.7*  MCV 100.4* 102.4* 100.8*    PLT 197 213 [id number]   Basic Metabolic Panel: Recent Labs  Lab 10/06/19 0050 10/06/19 0604 10/07/19 0532  NA 137 137 138  K 4.0 4.0 4.4  CL 102 100 103  CO2 24 22 28   GLUCOSE 124* 124* 106*  BUN 67* 65* 59*  CREATININE 1.93* 1.89* 1.87*  CALCIUM 8.2* 8.4* 8.4*   GFR: Estimated Creatinine Clearance: 18.3 mL/min (A) (by C-G formula based on SCr of 1.87 mg/dL (H)). Liver Function Tests: Recent Labs  Lab 10/06/19 0050 10/06/19 0604  AST 17 18  ALT 24 26  ALKPHOS 67 71  BILITOT 0.7 1.1  PROT 6.0* 6.1*  ALBUMIN 2.4* 2.6*   No results for input(s): LIPASE, AMYLASE in the last 168 hours. No results for input(s): AMMONIA in the last 168 hours. Coagulation Profile: No results for input(s): INR, PROTIME in the last 168 hours. Cardiac Enzymes: No results for input(s): CKTOTAL, CKMB, CKMBINDEX, TROPONINI in the last 168 hours. BNP (last 3 results) No results for input(s): PROBNP in the last 8760 hours. HbA1C: No results for input(s): HGBA1C in the last 72 hours. CBG: No results for input(s): GLUCAP in the last 168 hours. Lipid Profile: No results for input(s): CHOL, HDL, LDLCALC, TRIG, CHOLHDL, LDLDIRECT in the last 72 hours. Thyroid Function Tests: No results for input(s): TSH, T4TOTAL, FREET4, T3FREE, THYROIDAB in the last 72 hours. Anemia Panel: No results for input(s): VITAMINB12, FOLATE, FERRITIN, TIBC, IRON, RETICCTPCT in the last 72 hours. Sepsis Labs: Recent Labs  Lab 10/06/19 0054  LATICACIDVEN 1.2    Recent Results (from the past 240 hour(s))  Culture, blood (routine x 2)     Status: None (Preliminary result)   Collection Time: 10/06/19 12:51 AM   Specimen: BLOOD  Result Value Ref Range Status   Specimen Description   Final    BLOOD LEFT ANTECUBITAL Performed at [facility] [address] [place], [place] [address]    Special Requests   Final    BOTTLES DRAWN AEROBIC AND ANAEROBIC Blood Culture adequate volume Performed at [facility] [address], [place], [place] [address]    Culture   Final    NO GROWTH 1 DAY Performed at [facility], [place] [address] [place], [place] [address]    Report Status PENDING  Incomplete  Urine culture     Status: Abnormal   Collection Time: 10/06/19  1:56 AM   Specimen: Urine, Clean Catch  Result Value Ref Range Status   Specimen Description   Final    URINE, CLEAN CATCH Performed at [facility], [place] [address], [place], [place] [address]    Special Requests   Final    NONE Performed at [facility], [place] [address], [place], [place] [address]    Culture 90,000 COLONIES/mL PSEUDOMONAS AERUGINOSA (A)  Final   Report Status 10/07/2019 FINAL  Final   Organism ID, Bacteria PSEUDOMONAS AERUGINOSA (A)  Final  Susceptibility   Pseudomonas aeruginosa - MIC*    CEFTAZIDIME 4 SENSITIVE Sensitive     CIPROFLOXACIN 2 INTERMEDIATE Intermediate     GENTAMICIN 2 SENSITIVE Sensitive     IMIPENEM 2 SENSITIVE Sensitive     PIP/TAZO 8 SENSITIVE Sensitive     * 90,000 COLONIES/mL PSEUDOMONAS AERUGINOSA  Culture, blood (routine x 2)     Status: None (Preliminary result)   Collection Time: 10/06/19  1:56 AM   Specimen: BLOOD  Result Value Ref Range Status   Specimen Description   Final    BLOOD RIGHT ANTECUBITAL Performed at [place] [address], [place], [place] [address]    Special Requests   Final    BOTTLES DRAWN AEROBIC AND ANAEROBIC Blood Culture results may not be optimal due to an excessive volume of blood received in culture bottles Performed at [place] [address], [place], [place] [address]    Culture   Final    NO GROWTH 1 DAY Performed at [facility], [place] [address], [place], [place] [address]    Report Status PENDING  Incomplete     Radiology Studies: CT Abdomen Pelvis Wo Contrast  Result Date: 10/06/2019 CLINICAL DATA:  Abdominal distension  EXAM: CT ABDOMEN AND PELVIS WITHOUT CONTRAST TECHNIQUE: Multidetector CT imaging of the abdomen and pelvis was performed following the standard protocol without IV contrast. COMPARISON:  09/24/2019 FINDINGS: Lower chest: Bibasilar atelectatic changes are noted improved from the prior exam. Previously seen effusions have resolved. Hepatobiliary: No focal liver abnormality is seen. No gallstones, gallbladder wall thickening, or biliary dilatation. Pancreas: Pancreas is well visualized in the tail and body. In the region of the head of the pancreas there is significant increased density identified which is new from the prior exam from 10 days previous. This likely represents filling of a large duodenal diverticulum with adjacent pancreatic tissue as opposed to a pancreatic abnormality. Spleen: Normal in size without focal abnormality. Adrenals/Urinary Tract: Adrenal glands are within normal limits. Kidneys are well visualized bilaterally with tiny nonobstructing renal stones. No obstructive changes are seen. The bladder is well distended. Stomach/Bowel: There is evidence of fecal impaction within the rectum. Diverticular change without evidence of diverticulitis is seen. No obstructive or inflammatory changes of the colon are seen. The small bowel is otherwise within limits. Stomach is within normal limits. Vascular/Lymphatic: Aortic atherosclerosis. No enlarged abdominal or pelvic lymph nodes. Reproductive: Uterus and bilateral adnexa are unremarkable. Other: No abdominal wall hernia or abnormality. No abdominopelvic ascites. Musculoskeletal: Degenerative changes of the lumbar spine are noted. Changes of prior vertebral augmentation seen at T12. IMPRESSION: Changes consistent with fecal impaction within the rectum. 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 No other focal abnormality is noted. Electronically Signed   By: Inez Catalina M.D.   On: 10/06/2019 01:41   DG Chest 1 View  Result Date: 10/06/2019 CLINICAL DATA:  Pain EXAM: CHEST  1 VIEW COMPARISON:  09/27/2019 FINDINGS: Cardiac shadow is enlarged but stable. Aortic calcifications are again seen. The lungs are well aerated bilaterally. No focal infiltrate or sizable effusion is seen. Clearing of previously seen left basilar infiltrate is noted. No bony abnormality is noted. Changes of prior vertebral augmentation are seen. IMPRESSION: No acute abnormality noted. Electronically Signed   By: Inez Catalina M.D.   On: 10/06/2019 01:25   DG Femur Min 2 Views Left  Result  Date: 10/06/2019 CLINICAL DATA:  Left leg bruising EXAM: LEFT FEMUR 2 VIEWS COMPARISON:  None. FINDINGS: Left knee prosthesis is seen. No definitive acute fracture or dislocation is seen. No soft tissue abnormality is noted. IMPRESSION: No acute abnormality noted. Electronically Signed   By: Inez Catalina M.D.   On: 10/06/2019 01:27    Scheduled Meds: . amLODipine  5 mg Oral Daily  . apixaban  5 mg Oral BID  . cholecalciferol  2,000 Units Oral Daily  . febuxostat  80 mg Oral Daily  . hydrALAZINE  25 mg Oral TID  . HYDROcodone-acetaminophen  1 tablet Oral TID  . isosorbide dinitrate  20 mg Oral Daily  . isosorbide dinitrate  40 mg Oral BID  . lactobacillus acidophilus & bulgar  1 tablet Oral TID WC  . levothyroxine  75 mcg Oral Daily  . LORazepam  0.5 mg Oral Daily  . metoprolol succinate  25 mg Oral Daily  . multivitamin with minerals  1 tablet Oral Daily  . pantoprazole  40 mg Oral Daily  . polyethylene glycol  17 g Oral Daily  . potassium chloride  10 mEq Oral Daily  . senna-docusate  1 tablet Oral BID  . torsemide  10 mg Oral Once per day on Sun Tue Wed Fri Sat  . [START ON 10/08/2019] torsemide  20 mg Oral Once per day on Mon Thu  . traZODone  50 mg Oral QHS   Continuous Infusions: . meropenem (MERREM) IV 500 mg (10/07/19 0538)     LOS: 0 days   Alma Friendly, MD [facility] Pager On Amion  If 7PM-7AM, please contact night-coverage 10/07/2019, 3:13 PM

## 2019-10-08 DIAGNOSIS — N183 Chronic kidney disease, stage 3 unspecified: Secondary | ICD-10-CM

## 2019-10-08 LAB — IRON AND TIBC
Iron: 28 ug/dL (ref 28–170)
Saturation Ratios: 17 % (ref 10.4–31.8)
TIBC: 167 ug/dL — ABNORMAL LOW (ref 250–450)
UIBC: 139 ug/dL

## 2019-10-08 LAB — FERRITIN: Ferritin: 390 ng/mL — ABNORMAL HIGH (ref 11–307)

## 2019-10-08 LAB — BASIC METABOLIC PANEL
Anion gap: 5 (ref 5–15)
BUN: 57 mg/dL — ABNORMAL HIGH (ref 8–23)
CO2: 30 mmol/L (ref 22–32)
Calcium: 8.5 mg/dL — ABNORMAL LOW (ref 8.9–10.3)
Chloride: 102 mmol/L (ref 98–111)
Creatinine, Ser: 1.75 mg/dL — ABNORMAL HIGH (ref 0.44–1.00)
GFR calc Af Amer: 29 mL/min — ABNORMAL LOW (ref >60)
GFR calc non Af Amer: 25 mL/min — ABNORMAL LOW (ref >60)
Glucose, Bld: 94 mg/dL (ref 70–99)
Potassium: 4.6 mmol/L (ref 3.5–5.1)
Sodium: 137 mmol/L (ref 135–145)

## 2019-10-08 LAB — CBC WITH DIFFERENTIAL/PLATELET
Abs Immature Granulocytes: 0.16 10*3/uL — ABNORMAL HIGH (ref 0.00–0.07)
Basophils Absolute: 0 10*3/uL (ref 0.0–0.1)
Basophils Relative: 0 %
Eosinophils Absolute: 0.3 10*3/uL (ref 0.0–0.5)
Eosinophils Relative: 3 %
HCT: 27.4 % — ABNORMAL LOW (ref 36.0–46.0)
Hemoglobin: 8.1 g/dL — ABNORMAL LOW (ref 12.0–15.0)
Immature Granulocytes: 2 %
Lymphocytes Relative: 6 %
Lymphs Abs: 0.6 10*3/uL — ABNORMAL LOW (ref 0.7–4.0)
MCH: 30.5 pg (ref 26.0–34.0)
MCHC: 29.6 g/dL — ABNORMAL LOW (ref 30.0–36.0)
MCV: 103 fL — ABNORMAL HIGH (ref 80.0–100.0)
Monocytes Absolute: 0.6 10*3/uL (ref 0.1–1.0)
Monocytes Relative: 6 %
Neutro Abs: 7.9 10*3/uL — ABNORMAL HIGH (ref 1.7–7.7)
Neutrophils Relative %: 83 %
Platelets: 204 10*3/uL (ref 150–400)
RBC: 2.66 MIL/uL — ABNORMAL LOW (ref 3.87–5.11)
RDW: 15.9 % — ABNORMAL HIGH (ref 11.5–15.5)
WBC: 9.6 10*3/uL (ref 4.0–10.5)
nRBC: 0 % (ref 0.0–0.2)

## 2019-10-08 LAB — VITAMIN B12: Vitamin B-12: 387 pg/mL (ref 180–914)

## 2019-10-08 LAB — FOLATE: Folate: 19.7 ng/mL (ref >5.9)

## 2019-10-08 MED ORDER — VITAMIN B-12 1000 MCG PO TABS
1000.0000 ug | ORAL_TABLET | Freq: Every day | ORAL | Status: DC
  Administered 2019-10-08: 1000 ug via ORAL
  Filled 2019-10-08: qty 1

## 2019-10-08 MED ORDER — POLYETHYLENE GLYCOL 3350 17 G PO PACK: 17.0000 g | PACK | Freq: Every day | ORAL | 0 refills | Status: DC

## 2019-10-08 MED ORDER — CYANOCOBALAMIN 1000 MCG PO TABS: 1000.0000 ug | ORAL_TABLET | Freq: Every day | ORAL | Status: AC

## 2019-10-08 NOTE — TOC Transition Note (Signed)
Transition of Care [facility]) - CM/SW Discharge Note   Patient Details  Name: Scarlette Gammill MRN: [id number] Date of Birth: 1927/05/24  Transition [facility]) CM/SW Contact:  Ross Ludwig, LCSW Phone Number: 10/08/2019, 2:58 PM   Clinical Narrative:     Patient to be d/c'ed today to [facility] at [facility].  Patient and family agreeable to plans will transport via ems RN to call report to [phone].  CSW spoke to patient's daughter and made her aware that patient is discharging today.  Final next level of care: Skilled Nursing Facility Barriers to Discharge: Barriers Resolved   Patient Goals and CMS Choice Patient states their goals for this hospitalization and ongoing recovery are:: Patient plans to return back to SNF. CMS Medicare.gov Compare Post Acute Care list provided to:: Other (Comment Required) Choice offered to / list presented to : Adult Children  Discharge Placement   Existing PASRR number confirmed : 10/08/19          Patient chooses bed at: [facility] Patient to be transferred to facility by: [facility] Name of family member notified: Patient's daughter Manuela Schwartz Patient and family notified of of transfer: 10/08/19  Discharge Plan and Services     Post Acute Care Choice: [place]          DME Arranged: N/A         HH Arranged: NA          Social Determinants of Health (SDOH) Interventions     Readmission Risk Interventions Readmission Risk Prevention Plan 10/08/2019 09/25/2019  Transportation Screening Complete Complete  HRI or Home Care Consult - Not Complete  HRI or Home Care Consult comments - Going to SNF  Social Work Consult for [place] Planning/Counseling - Complete  Palliative Care Screening - Not Applicable  Medication Review Press photographer) Referral to Pharmacy Complete  PCP or Specialist appointment within 3-5 days of discharge Complete -  SW Recovery Care/Counseling Consult Complete  -  Palliative Care Screening Complete -  [place] Complete -  Some recent data might be hidden

## 2019-10-08 NOTE — Progress Notes (Signed)
Patient discharged to [facility] in stable condition.

## 2019-10-08 NOTE — Progress Notes (Signed)
Patient was repositioned every 2 hours to decrease risk for pressure injury. Pillows were used to help offload weight and patient to stay on sides.

## 2019-10-08 NOTE — Progress Notes (Signed)
[facility] arrived to pick patient up to take her to  [facility]. Patient in no distress upon discharge.

## 2019-10-08 NOTE — TOC Initial Note (Signed)
Transition of Care [facility]) - Initial/Assessment Note    Patient Details  Name: Krystal Mckenzie MRN: [id number] Date of Birth: March 15, 1927  Transition of Care [facility]) CM/SW Contact:    Ross Ludwig, LCSW Phone Number: 10/08/2019, 2:12 PM  Clinical Narrative:                  Patient is a 86 year old female who is alert and oriented x1.  Patient is a long term care resident at [facility].  Patient has dementia, assessment completed by speaking to SNF and reviewing patient's chart.  Patient has been at [facility] for several years, family did not report any issues or concerns.  Patient is planning to return back to SNF once she is medically ready for discharge.  CSW spoke to person covering admissions today, and she said patient can return today once discharge summary has been completed.  Expected Discharge Plan: Skilled Nursing Facility Barriers to Discharge: Continued Medical Work up   Patient Goals and CMS Choice Patient states their goals for this hospitalization and ongoing recovery are:: Patient plans to return back to SNF. CMS Medicare.gov Compare Post Acute Care list provided to:: Other (Comment Required) Choice offered to / list presented to : (Admissions worker at [facility])  Expected Discharge Plan and Services Expected Discharge Plan: [place] Choice: [place] arrangements for the past 2 months: [place]: NA          Prior Living Arrangements/Services Living arrangements for the past 2 months: [place] Lives with:: Facility Resident Patient language and need for interpreter reviewed:: Yes Do you feel safe going back to the place where you live?: Yes      Need for Family Participation in Patient Care: Yes (Comment) Care giver support system in place?: Yes (comment)   Criminal Activity/Legal Involvement Pertinent to  Current Situation/Hospitalization: No - Comment as needed  Activities of Daily Living   ADL Screening (condition at time of admission) Patient's cognitive ability adequate to safely complete daily activities?: No Is the patient deaf or have difficulty hearing?: Yes Does the patient have difficulty seeing, even when wearing glasses/contacts?: No Does the patient have difficulty concentrating, remembering, or making decisions?: Yes Patient able to express need for assistance with ADLs?: Yes Does the patient have difficulty dressing or bathing?: Yes Independently performs ADLs?: No Communication: Independent Dressing (OT): Needs assistance Is this a change from baseline?: Pre-admission baseline Grooming: Needs assistance Is this a change from baseline?: Pre-admission baseline Feeding: Independent Is this a change from baseline?: Pre-admission baseline Bathing: Needs assistance Is this a change from baseline?: Pre-admission baseline Toileting: Needs assistance Is this a change from baseline?: Pre-admission baseline In/Out Bed: Needs assistance Is this a change from baseline?: Pre-admission baseline Walks in Home: Needs assistance Is this a change from baseline?: Pre-admission baseline Does the patient have difficulty walking or climbing stairs?: Yes Weakness of Legs: Both Weakness of Arms/Hands: Both  Permission Sought/Granted Permission sought to share information with : Family Supports, Customer service manager Permission granted to share information with : Yes, Release of Information Signed  Share Information with NAME: Krystal Mckenzie Daughter [phone]  [phone] and Krystal Mckenzie, Krystal Mckenzie Relative [phone]  [phone]  Permission granted to share info w AGENCY: SNF admissions  Emotional Assessment Appearance:: Appears stated age   Affect (typically observed): Accepting, Appropriate, Calm Orientation: : Oriented to Self Alcohol / Substance Use: Not  Applicable Psych Involvement: No (comment)  Admission diagnosis:  Fecal impaction ([place]) [K56.41] Pain [R52] Generalized abdominal pain [R10.84] Acute lower UTI [N39.0] Urinary tract infection without hematuria, site unspecified [N39.0] Anemia, unspecified type [D64.9] Acute UTI [N39.0] Patient Active Problem List   Diagnosis Date Noted  . Acute UTI 10/07/2019  . Acute lower UTI 10/06/2019  . Fecal impaction ([place]) 10/06/2019  . CKD (chronic kidney disease), stage III 10/06/2019  . Acute respiratory failure due to COVID-19 ([place]) 09/21/2019  . Hyperkalemia 09/20/2019  . Acute renal failure superimposed on chronic kidney disease ([place]) 09/20/2019  . Pneumonia 09/20/2019  . Recurrent UTI 09/20/2019  . Hypothyroid   . Acute respiratory failure with hypoxia ([place])   . Hypertension   . Chronic diastolic CHF (congestive heart failure) ([place])   . Positive D dimer    PCP:  Patient, No Pcp Per Pharmacy:   [id number] - [place], [place] - [address] AT [place] [address] [place] [address] Phone: [phone] Fax: [phone]     Social Determinants of Health (SDOH) Interventions    Readmission Risk Interventions Readmission Risk Prevention Plan 10/08/2019 09/25/2019  Transportation Screening Complete Complete  HRI or Home Care Consult - Not Complete  HRI or Home Care Consult comments - Going to SNF  Social Work Consult for [place] Planning/Counseling - Complete  Palliative Care Screening - Not Applicable  Medication Review Press photographer) Referral to Pharmacy Complete  PCP or Specialist appointment within 3-5 days of discharge Complete -  SW Recovery Care/Counseling Consult Complete -  Palliative Care Screening Complete -  [place] Complete -  Some recent data might be hidden

## 2019-10-08 NOTE — Discharge Summary (Signed)
Discharge Summary  Krystal Mckenzie [id number] DOB: 1927-08-14  PCP: Patient, No Pcp Per  Admit date: 10/05/2019 Discharge date: 10/08/2019  Time spent: 40 mins  Recommendations for Outpatient Follow-up:  1. Follow-up at SNF  Discharge Diagnoses:  Active Hospital Problems   Diagnosis Date Noted  . Fecal impaction ([place]) 10/06/2019  . Acute UTI 10/07/2019  . Acute lower UTI 10/06/2019  . CKD (chronic kidney disease), stage III 10/06/2019  . Hypothyroid   . Chronic diastolic CHF (congestive heart failure) [facility])     Resolved Hospital Problems  No resolved problems to display.    Discharge Condition: Stable  Diet recommendation: As tolerated  Vitals:   10/07/19 2105 10/08/19 0528  BP: 109/73 (!) 119/52  Pulse: 79 86  Resp: (!) 24 (!) 22  Temp: 98.5 F (36.9 C) 97.8 F (36.6 C)  SpO2: 99% 97%    History of present illness:  86 y.o.female,chronic diastolic CHF, hypertension, hypothyroidism, chronic kidney disease, covid-19 positive w recent admission 09/20/19, presents with constipation, lack of BM x 5 days and found to have UTI. Pt is a poor historian and unable to provide much history. In ED, CT abd/ pelvis showed changes consistent with fecal impaction within the rectum. No other focal abnormality is noted. Pt admitted for further management.      Today, patient noted to be stable, continues to require a lot of assistance.  Complains of chronic bilateral lower extremity pain which has been ongoing for years.  Denies any chest pain, abdominal pain, nausea/vomiting, fever/chills.  Discussed with the patient's daughter Eben Burow on 10/08/2019, all questions/concerns addressed.  Patient stable to be transferred to SNF for further rehab needs.   Hospital Course:  Principal Problem:   Fecal impaction [facility]) Active Problems:   Hypothyroid   Chronic diastolic CHF (congestive heart failure) (HCC)   Acute lower UTI   CKD (chronic kidney disease), stage III   Acute  UTI   Fecal impaction Resolved with cathartics given on presentation Continue with cathartics as needed  Acute lower UTI 2/2 Pseudomonas aeruginosa UA showed positive nitrites, leukocytes Urine cx with 90,000 Pseudomonas aeruginosa S/P meropenem, pharmacy recommending 1 dose of fosfomycin, received on 10/07/19   Hx of COVID-19 infection Tested positive for Covid on 09/20/2019 Currently still requiring about 2 L of O2, plan to wean off at SNF  Presumed PE, see discharge summary from prior admission Cont Eliquis x 50months  Hypothyroidism Cont Levothyroxine as tolerated  Chronic diastolic CHF Hypertension Cont Amlodipine 5mg  po qday, Hydralazine 25mg  op tid, Isordil 20mg  po qday, Toprol XL 25mg  po qday Continue on Torsemide 20mg  po 2x per week Monday, Thursday, 10mg  po qday other days.  CKD stage IV Creatinine at baseline Daily BMP  Anemia of chronic kidney disease Hemoglobin currently 8.1, baseline around 10-11 Anemia panel showed iron 28, sats 17%, folate 19.7, vitamin B12 387 Started on vitamin B12 supplements FOBT was unable to be collected Patient on Eliquis, monitor closely         Malnutrition Type:      Malnutrition Characteristics:      Nutrition Interventions:      Estimated body mass index is 29.15 kg/m as calculated from the following:   Height as of 09/21/19: 5\' 2"  (1.575 m).   Weight as of this encounter: 72.3 kg.    Procedures:  None  Consultations:  None  Discharge Exam: BP (!) 119/52 (BP Location: Left Arm)   Pulse 86   Temp 97.8 F (36.6 C) (Oral)  Resp (!) 22   Wt 72.3 kg   SpO2 97%   BMI 29.15 kg/m   General: NAD Cardiovascular: S1, S2 present Respiratory: Diminished breath sounds bilaterally    Discharge Instructions You were cared for by a hospitalist during your hospital stay. If you have any questions about your discharge medications or the care you received while you were in the hospital after you are  discharged, you can call the unit and asked to speak with the hospitalist on call if the hospitalist that took care of you is not available. Once you are discharged, your primary care physician will handle any further medical issues. Please note that NO REFILLS for any discharge medications will be authorized once you are discharged, as it is imperative that you return to your primary care physician (or establish a relationship with a primary care physician if you do not have one) for your aftercare needs so that they can reassess your need for medications and monitor your lab values.  Discharge Instructions    Diet - low sodium heart healthy   Complete by: As directed    Increase activity slowly   Complete by: As directed      Allergies as of 10/08/2019      Reactions   Morphine And Related    Cephalexin Hives, Swelling, Rash   Penicillins Swelling, Rash   Did it involve swelling of the face/tongue/throat, SOB, or low BP? Unknown Did it involve sudden or severe rash/hives, skin peeling, or any reaction on the inside of your mouth or nose? Unknown Did you need to seek medical attention at a hospital or doctor's office? Unknown When did it last happen? Unknown If all above answers are "NO", may proceed with cephalosporin use.      Medication List    STOP taking these medications   dexamethasone 6 MG tablet Commonly known as: DECADRON     TAKE these medications   acetaminophen 325 MG tablet Commonly known as: TYLENOL Take 650 mg by mouth every 4 (four) hours as needed for fever.   amLODipine 5 MG tablet Commonly known as: NORVASC Take 5 mg by mouth daily.   apixaban 5 MG Tabs tablet Commonly known as: ELIQUIS Take 1 tablet (5 mg total) by mouth 2 (two) times daily.   AZO-CRANBERRY PO Take 1 tablet by mouth 2 (two) times daily. AZO cranberry + Probiotic 250mg -30mg -50 million cell   bisacodyl 10 MG suppository Commonly known as: DULCOLAX Place 10 mg rectally every 12  (twelve) hours as needed for moderate constipation. x3days started 2.1.21   clindamycin 150 MG capsule Commonly known as: CLEOCIN Take 600 mg by mouth See admin instructions. 600 mg PO x1 one hour prior to all dental appointments   cyanocobalamin 1000 MCG tablet Take 1 tablet (1,000 mcg total) by mouth daily. Start taking on: October 09, 2019   Febuxostat 80 MG Tabs Take 80 mg by mouth daily.   hydrALAZINE 25 MG tablet Commonly known as: APRESOLINE Take 25 mg by mouth 3 (three) times daily.   HYDROcodone-acetaminophen 5-325 MG tablet Commonly known as: NORCO/VICODIN Take 1 tablet by mouth 3 (three) times daily.   isosorbide dinitrate 20 MG tablet Commonly known as: ISORDIL Take 20 mg by mouth daily. Midday (1330)   isosorbide dinitrate 20 MG tablet Commonly known as: ISORDIL Take 40 mg by mouth 2 (two) times daily. 800 and 2130   lactobacillus acidophilus Tabs tablet Take 2 tablets by mouth 2 (two) times daily with a meal.  lactulose 10 GM/15ML solution Commonly known as: CHRONULAC Take 20 g by mouth once.   levothyroxine 75 MCG tablet Commonly known as: SYNTHROID Take 75 mcg by mouth daily.   LORazepam 0.5 MG tablet Commonly known as: ATIVAN Take 1 tablet (0.5 mg total) by mouth daily.   metoprolol succinate 25 MG 24 hr tablet Commonly known as: TOPROL-XL Take 25 mg by mouth daily.   multivitamin-iron-minerals-folic acid chewable tablet Chew 1 tablet by mouth daily.   neomycin-polymyxin b-dexamethasone 3.5-10000-0.1 Oint Commonly known as: MAXITROL Place 1 application into the right eye at bedtime as needed (eye swelling, eye infection).   omeprazole 20 MG capsule Commonly known as: PRILOSEC Take 20 mg by mouth daily.   ondansetron 4 MG disintegrating tablet Commonly known as: ZOFRAN-ODT Take 4 mg by mouth every 8 (eight) hours as needed for nausea or vomiting.   polyethylene glycol 17 g packet Commonly known as: MIRALAX / GLYCOLAX Take 17 g by mouth  daily. What changed:   when to take this  reasons to take this   potassium chloride 10 MEQ tablet Commonly known as: KLOR-CON Take 10 mEq by mouth daily.   Preparation H 0.25-14-74.9 % rectal ointment Generic drug: phenylephrine-shark liver oil-mineral oil-petrolatum Place 1 application rectally 4 (four) times daily as needed for hemorrhoids.   Senna S 8.6-50 MG tablet Generic drug: senna-docusate Take 1 tablet by mouth 2 (two) times daily.   torsemide 10 MG tablet Commonly known as: DEMADEX Take 10 mg by mouth See admin instructions. 10 mg PO five times weekly (Tuesdays, Wednesdays, Fridays, Saturdays, and Sundays)   torsemide 20 MG tablet Commonly known as: DEMADEX Take 20 mg by mouth 2 (two) times a week. On Mondays and Thursdays   traZODone 50 MG tablet Commonly known as: DESYREL Take 1 tablet (50 mg total) by mouth at bedtime.   Vitamin D3 50 MCG (2000 UT) Tabs Take 50 mcg by mouth daily.      Allergies  Allergen Reactions  . Morphine And Related   . Cephalexin Hives, Swelling and Rash  . Penicillins Swelling and Rash    Did it involve swelling of the face/tongue/throat, SOB, or low BP? Unknown Did it involve sudden or severe rash/hives, skin peeling, or any reaction on the inside of your mouth or nose? Unknown Did you need to seek medical attention at a hospital or doctor's office? Unknown When did it last happen? Unknown If all above answers are "NO", may proceed with cephalosporin use.       The results of significant diagnostics from this hospitalization (including imaging, microbiology, ancillary and laboratory) are listed below for reference.    Significant Diagnostic Studies: CT Abdomen Pelvis Wo Contrast  Result Date: 10/06/2019 CLINICAL DATA:  Abdominal distension EXAM: CT ABDOMEN AND PELVIS WITHOUT CONTRAST TECHNIQUE: Multidetector CT imaging of the abdomen and pelvis was performed following the standard protocol without IV contrast.  COMPARISON:  09/24/2019 FINDINGS: Lower chest: Bibasilar atelectatic changes are noted improved from the prior exam. Previously seen effusions have resolved. Hepatobiliary: No focal liver abnormality is seen. No gallstones, gallbladder wall thickening, or biliary dilatation. Pancreas: Pancreas is well visualized in the tail and body. In the region of the head of the pancreas there is significant increased density identified which is new from the prior exam from 10 days previous. This likely represents filling of a large duodenal diverticulum with adjacent pancreatic tissue as opposed to a pancreatic abnormality. Spleen: Normal in size without focal abnormality. Adrenals/Urinary Tract: Adrenal glands are  within normal limits. Kidneys are well visualized bilaterally with tiny nonobstructing renal stones. No obstructive changes are seen. The bladder is well distended. Stomach/Bowel: There is evidence of fecal impaction within the rectum. Diverticular change without evidence of diverticulitis is seen. No obstructive or inflammatory changes of the colon are seen. The small bowel is otherwise within limits. Stomach is within normal limits. Vascular/Lymphatic: Aortic atherosclerosis. No enlarged abdominal or pelvic lymph nodes. Reproductive: Uterus and bilateral adnexa are unremarkable. Other: No abdominal wall hernia or abnormality. No abdominopelvic ascites. Musculoskeletal: Degenerative changes of the lumbar spine are noted. Changes of prior vertebral augmentation seen at T12. IMPRESSION: Changes consistent with fecal impaction within the rectum. 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 No other focal abnormality is noted. Electronically Signed   By: Inez Catalina M.D.   On: 10/06/2019 01:41   CT ABDOMEN PELVIS WO CONTRAST  Result Date: 09/24/2019 CLINICAL DATA:  Lower abdominal and pelvic pain. EXAM: CT ABDOMEN AND PELVIS  WITHOUT CONTRAST TECHNIQUE: Multidetector CT imaging of the abdomen and pelvis was performed following the standard protocol without IV contrast. COMPARISON:  CT scan 05/10/2011 FINDINGS: Exam significantly limited by patient motion. Lower chest: Small bilateral pleural effusions and bibasilar infiltrates. The heart is mildly enlarged. Stable vascular calcifications. Hepatobiliary: No focal hepatic lesions or intrahepatic biliary dilatation. Gallbladder is grossly normal. No obvious common bile duct dilatation. Pancreas: Very poorly visualized due to motion artifact. No gross abnormality. Spleen: Normal size. Adrenals/Urinary Tract: No obvious renal abnormality. No hydronephrosis or renal calculi. The left kidney is small. The bladder is grossly normal. Mild bladder distention. No bladder mass or calculi. Stomach/Bowel: Stomach, duodenum, small bowel and colon are grossly normal but exam quite limited by motion artifact. No obvious acute obstructive findings or mass lesions. Colonic diverticulosis without definite findings for acute diverticulitis. Vascular/Lymphatic: Advanced vascular calcifications. No definite aneurysm. No mesenteric or retroperitoneal mass or adenopathy is identified. Reproductive: The uterus and ovaries are unremarkable. Other: No pelvic mass or pelvic lymphadenopathy. No free pelvic fluid collections. No inguinal mass. Small left inguinal hernia containing fat. Diffuse bulging anterior abdominal wall but no discrete hernia. Musculoskeletal: Scoliosis and degenerative lumbar spondylosis. Remote vertebral augmentation changes noted at T12. No acute bony findings or destructive bony changes. IMPRESSION: 1. Very limited examination due to motion artifact. 2. Small bilateral pleural effusions and bibasilar infiltrates. 3. No obvious acute abdominal/pelvic findings, mass lesions or adenopathy. Electronically Signed   By: Marijo Sanes M.D.   On: 09/24/2019 17:02   DG Chest 1 View  Result Date:  10/06/2019 CLINICAL DATA:  Pain EXAM: CHEST  1 VIEW COMPARISON:  09/27/2019 FINDINGS: Cardiac shadow is enlarged but stable. Aortic calcifications are again seen. The lungs are well aerated bilaterally. No focal infiltrate or sizable effusion is seen. Clearing of previously seen left basilar infiltrate is noted. No bony abnormality is noted. Changes of prior vertebral augmentation are seen. IMPRESSION: No acute abnormality noted. Electronically Signed   By: Inez Catalina M.D.   On: 10/06/2019 01:25   US RENAL  Result Date: 09/22/2019 CLINICAL DATA:  Acute renal failure EXAM: RENAL / URINARY TRACT ULTRASOUND COMPLETE COMPARISON:  CT 05/10/2011 FINDINGS: Right Kidney: Renal measurements: 8.2 x 3.8 x 3.9 cm = volume: 64.3 mL. Echogenic cortex. No mass or hydronephrosis Left Kidney: Renal measurements: 7.8 x 3.3 x 3.4 cm = volume: 45.3 mL. Echogenic cortex. No mass or hydronephrosis. Bladder: Appears normal for  degree of bladder distention. Other: None. IMPRESSION: Small echogenic kidneys consistent with medical renal disease and mild atrophy. No hydronephrosis. Electronically Signed   By: Donavan Foil M.D.   On: 09/22/2019 19:19   DG CHEST PORT 1 VIEW  Result Date: 09/27/2019 CLINICAL DATA:  Hypoxemia, COVID-19 EXAM: PORTABLE CHEST 1 VIEW COMPARISON:  Portable exam 1046 hours compared to 09/20/2019 FINDINGS: Upper normal heart size. Atherosclerotic calcification aorta. Mediastinal contours and pulmonary vascularity otherwise normal. Small LEFT pleural effusion with atelectasis versus infiltrate LEFT lower lobe, minimally improved. Remaining lungs clear. No pneumothorax. Bones demineralized. IMPRESSION: Small LEFT pleural effusion with slightly improved infiltrate versus atelectasis LEFT lower lobe. Electronically Signed   By: Lavonia Dana M.D.   On: 09/27/2019 10:54   DG Chest Port 1 View  Result Date: 09/20/2019 CLINICAL DATA:  Shortness of breath, COVID-19 positive EXAM: PORTABLE CHEST 1 VIEW COMPARISON:   Radiograph 05/09/2011 FINDINGS: Patchy right basilar and dense retrocardiac opacities. Obscuration of left hemidiaphragm may reflect basilar consolidation or effusion. No visible pneumothorax. Biapical pleuroparenchymal scarring is seen. There is some cephalized vascularity with hazy interstitial opacities suggesting pulmonary edema. Tracheal tortuosity is similar to prior. The aorta is calcified. The remaining cardiomediastinal contours are unremarkable. No acute osseous or soft tissue abnormality. Degenerative changes are present in the imaged spine and shoulders. Telemetry leads and support devices overlie the chest. IMPRESSION: 1. Bibasilar opacities, left greater than right, worrisome for pneumonia in the setting of COVID-19. 2. Pulmonary vascular congestion and hazy interstitial opacities suggesting some concomitant edema. 3. Biapical pleuroparenchymal scarring. 4.  Aortic Atherosclerosis (ICD10-I70.0). Electronically Signed   By: Lovena Le M.D.   On: 09/20/2019 21:06   DG Femur Min 2 Views Left  Result Date: 10/06/2019 CLINICAL DATA:  Left leg bruising EXAM: LEFT FEMUR 2 VIEWS COMPARISON:  None. FINDINGS: Left knee prosthesis is seen. No definitive acute fracture or dislocation is seen. No soft tissue abnormality is noted. IMPRESSION: No acute abnormality noted. Electronically Signed   By: Inez Catalina M.D.   On: 10/06/2019 01:27   VAS [place] LOWER EXTREMITY VENOUS (DVT)  Result Date: 09/22/2019  Lower Venous Study Indications: Pain.  Risk Factors: COVID 19 positive. Limitations: Poor ultrasound/tissue interface and patient pain tolerance, patient immobility, patient positioning. Comparison Study: No prior studies. Performing Technologist: Oliver Hum RVT  Examination Guidelines: A complete evaluation includes B-mode imaging, spectral Doppler, color Doppler, and power Doppler as needed of all accessible portions of each vessel. Bilateral testing is considered an integral part of a complete  examination. Limited examinations for reoccurring indications may be performed as noted.  +---------+---------------+---------+-----------+----------+--------------+ RIGHT    CompressibilityPhasicitySpontaneityPropertiesThrombus Aging +---------+---------------+---------+-----------+----------+--------------+ CFV      Full           Yes      Yes                                 +---------+---------------+---------+-----------+----------+--------------+ FV Prox  Full                                                        +---------+---------------+---------+-----------+----------+--------------+ FV Mid                  Yes      Yes                                 +---------+---------------+---------+-----------+----------+--------------+  FV Distal               Yes      Yes                                 +---------+---------------+---------+-----------+----------+--------------+ POP      Full           Yes      Yes                                 +---------+---------------+---------+-----------+----------+--------------+ PTV      Full                                                        +---------+---------------+---------+-----------+----------+--------------+ PERO                                                  Not visualized +---------+---------------+---------+-----------+----------+--------------+   +---------+---------------+---------+-----------+----------+--------------+ LEFT     CompressibilityPhasicitySpontaneityPropertiesThrombus Aging +---------+---------------+---------+-----------+----------+--------------+ CFV                     Yes      Yes                                 +---------+---------------+---------+-----------+----------+--------------+ FV Prox                 Yes      Yes                                 +---------+---------------+---------+-----------+----------+--------------+ FV Mid                  Yes       Yes                                 +---------+---------------+---------+-----------+----------+--------------+ FV Distal                                             Not visualized +---------+---------------+---------+-----------+----------+--------------+ POP                                                   Not visualized +---------+---------------+---------+-----------+----------+--------------+ PTV                                                   Not visualized +---------+---------------+---------+-----------+----------+--------------+ PERO  Not visualized +---------+---------------+---------+-----------+----------+--------------+     Summary: Right: There is no evidence of deep vein thrombosis in the lower extremity. However, portions of this examination were limited- see technologist comments above. No cystic structure found in the popliteal fossa. Left: There is no evidence of deep vein thrombosis in the lower extremity. However, portions of this examination were limited- see technologist comments above. No cystic structure found in the popliteal fossa.  *See table(s) above for measurements and observations. Electronically signed by Deitra Mayo MD on 09/22/2019 at 12:53:25 PM.    Final     Microbiology: Recent Results (from the past 240 hour(s))  Culture, blood (routine x 2)     Status: None (Preliminary result)   Collection Time: 10/06/19 12:51 AM   Specimen: BLOOD  Result Value Ref Range Status   Specimen Description   Final    BLOOD LEFT ANTECUBITAL Performed at [facility], [place] [address] [place], [place] [address]    Special Requests   Final    BOTTLES DRAWN AEROBIC AND ANAEROBIC Blood Culture adequate volume Performed at [facility] [address] [place], [place] [address]    Culture   Final    NO GROWTH 2 DAYS Performed at [facility] [address], [place], [place] [address]    Report Status PENDING  Incomplete  Urine culture     Status: Abnormal   Collection Time: 10/06/19  1:56 AM   Specimen: Urine, Clean Catch  Result Value Ref Range Status   Specimen Description   Final    URINE, CLEAN CATCH Performed at [facility], [place] [address] [place], [place] [address]    Special Requests   Final    NONE Performed at [facility], [place] [address] [place], [place] [address]    Culture 90,000 COLONIES/mL PSEUDOMONAS AERUGINOSA (A)  Final   Report Status 10/07/2019 FINAL  Final   Organism ID, Bacteria PSEUDOMONAS AERUGINOSA (A)  Final      Susceptibility   Pseudomonas aeruginosa - MIC*    CEFTAZIDIME 4 SENSITIVE Sensitive     CIPROFLOXACIN 2 INTERMEDIATE Intermediate     GENTAMICIN 2 SENSITIVE Sensitive     IMIPENEM 2 SENSITIVE Sensitive     PIP/TAZO 8 SENSITIVE Sensitive     * 90,000 COLONIES/mL PSEUDOMONAS AERUGINOSA  Culture, blood (routine x 2)     Status: None (Preliminary result)   Collection Time: 10/06/19  1:56 AM   Specimen: BLOOD  Result Value Ref Range Status   Specimen Description   Final    BLOOD RIGHT ANTECUBITAL Performed at [facility], [place] [address], [place], [place] [address]    Special Requests   Final    BOTTLES DRAWN AEROBIC AND ANAEROBIC Blood Culture results may not be optimal due to an excessive volume of blood received in culture bottles Performed at [facility] [address] [place], [place] [address]    Culture   Final    NO GROWTH 2 DAYS Performed at [facility] [address] [place], [place] [address]    Report Status PENDING  Incomplete     Labs: Basic Metabolic Panel: Recent Labs  Lab 10/06/19 0050 10/06/19 0604 10/07/19 0532 10/08/19 0538  NA 137 137 138 137  K 4.0 4.0 4.4 4.6  CL 102 100 103 102  CO2 24 22 28 30   GLUCOSE 124* 124* 106* 94  BUN 67* 65* 59* 57*  CREATININE  1.93*  1.89* 1.87* 1.75*  CALCIUM 8.2* 8.4* 8.4* 8.5*   Liver Function Tests: Recent Labs  Lab 10/06/19 0050 10/06/19 0604  AST 17 18  ALT 24 26  ALKPHOS 67 71  BILITOT 0.7 1.1  PROT 6.0* 6.1*  ALBUMIN 2.4* 2.6*   No results for input(s): LIPASE, AMYLASE in the last 168 hours. No results for input(s): AMMONIA in the last 168 hours. CBC: Recent Labs  Lab 10/06/19 0050 10/06/19 0604 10/07/19 0532 10/08/19 0538  WBC 12.5* 11.4* 11.0* 9.6  NEUTROABS 11.1*  --   --  7.9*  HGB 8.4* 9.1* 7.9* 8.1*  HCT 26.9* 29.9* 25.7* 27.4*  MCV 100.4* 102.4* 100.8* 103.0*  PLT 197 213 194 204   Cardiac Enzymes: No results for input(s): CKTOTAL, CKMB, CKMBINDEX, TROPONINI in the last 168 hours. BNP: BNP (last 3 results) No results for input(s): BNP in the last 8760 hours.  ProBNP (last 3 results) No results for input(s): PROBNP in the last 8760 hours.  CBG: No results for input(s): GLUCAP in the last 168 hours.     Signed:  Alma Friendly, MD [facility] 10/08/2019, 2:18 PM

## 2019-10-08 NOTE — [place] FL2 (Signed)
[place] MEDICAID FL2 LEVEL OF CARE SCREENING TOOL     IDENTIFICATION  Patient Name: Krystal Mckenzie Birthdate: 11-01-1926 Sex: female Admission Date (Current Location): 10/05/2019  [facility] and [place] Number:  Herbalist and Address:  [facility],  [place], [place]      Provider Number: [id number]  Attending Physician Name and Address:  Alma Friendly, MD  Relative Name and Phone Number:  Eben Burow Daughter [phone]  [phone] or Christle, [place] Relative [phone]  [phone]    Current Level of Care: Hospital Recommended Level of Care: [place] Prior Approval Number:    Date Approved/Denied:   PASRR Number: [id number]  Discharge Plan: SNF    Current Diagnoses: Patient Active Problem List   Diagnosis Date Noted  . Acute UTI 10/07/2019  . Acute lower UTI 10/06/2019  . Fecal impaction ([place]) 10/06/2019  . CKD (chronic kidney disease), stage III 10/06/2019  . Acute respiratory failure due to COVID-19 ([place]) 09/21/2019  . Hyperkalemia 09/20/2019  . Acute renal failure superimposed on chronic kidney disease ([place]) 09/20/2019  . Pneumonia 09/20/2019  . Recurrent UTI 09/20/2019  . Hypothyroid   . Acute respiratory failure with hypoxia ([place])   . Hypertension   . Chronic diastolic CHF (congestive heart failure) ([place])   . Positive D dimer     Orientation RESPIRATION BLADDER Height & Weight     Self  O2(2L) Incontinent Weight: 159 lb 6.3 oz (72.3 kg) Height:     BEHAVIORAL SYMPTOMS/MOOD NEUROLOGICAL BOWEL NUTRITION STATUS      Incontinent Diet(Cardiac)  AMBULATORY STATUS COMMUNICATION OF NEEDS Skin   Extensive Assist Verbally PU Stage and Appropriate Care PU Stage 1 Dressing: (PRN dressing changes) PU Stage 2 Dressing: (PRN dressing changes)                   Personal Care Assistance Level of Assistance  Bathing, Feeding, Dressing Bathing Assistance: Maximum assistance Feeding  assistance: Maximum assistance Dressing Assistance: Maximum assistance     Functional Limitations Info  Sight, Hearing, Speech Sight Info: Adequate Hearing Info: Adequate Speech Info: Adequate    SPECIAL CARE FACTORS FREQUENCY                       Contractures Contractures Info: Not present    Additional Factors Info  Code Status, Allergies, Isolation Precautions Code Status Info: DNR Allergies Info: Morphine And Related Cephalexin Penicillins     Isolation Precautions Info: Covid +     Current Medications (10/08/2019):  This is the current hospital active medication list Current Facility-Administered Medications  Medication Dose Route Frequency Provider Last Rate Last Admin  . acetaminophen (TYLENOL) tablet 650 mg  650 mg Oral Q6H PRN Jani Gravel, MD   650 mg at 10/07/19 [id number]   Or  . acetaminophen (TYLENOL) suppository 650 mg  650 mg Rectal Q6H PRN Jani Gravel, MD      . amLODipine (NORVASC) tablet 5 mg  5 mg Oral Daily Jani Gravel, MD   5 mg at 10/08/19 1033  . apixaban (ELIQUIS) tablet 5 mg  5 mg Oral BID Dorrene German, RPH   5 mg at 10/08/19 1033  . bisacodyl (DULCOLAX) suppository 10 mg  10 mg Rectal Q12H PRN Jani Gravel, MD      . cholecalciferol (VITAMIN D3) tablet 2,000 Units  2,000 Units Oral Daily Jani Gravel, MD   2,000 Units at 10/08/19 1034  . febuxostat (ULORIC) tablet 80 mg  80  mg Oral Daily Jani Gravel, MD   80 mg at 10/08/19 1042  . hydrALAZINE (APRESOLINE) tablet 25 mg  25 mg Oral TID Jani Gravel, MD   25 mg at 10/08/19 1033  . HYDROcodone-acetaminophen (NORCO/VICODIN) 5-325 MG per tablet 1 tablet  1 tablet Oral TID Jani Gravel, MD   1 tablet at 10/08/19 1033  . isosorbide dinitrate (ISORDIL) tablet 20 mg  20 mg Oral Daily Jani Gravel, MD   20 mg at 10/07/19 1214  . isosorbide dinitrate (ISORDIL) tablet 40 mg  40 mg Oral BID Jani Gravel, MD   40 mg at 10/08/19 0845  . lactobacillus acidophilus & bulgar (LACTINEX) chewable tablet 1 tablet  1 tablet Oral TID  WC Jani Gravel, MD   1 tablet at 10/08/19 1127  . levothyroxine (SYNTHROID) tablet 75 mcg  75 mcg Oral Daily Jani Gravel, MD   75 mcg at 10/08/19 [id number]  . LORazepam (ATIVAN) tablet 0.5 mg  0.5 mg Oral Daily Jani Gravel, MD   0.5 mg at 10/08/19 1033  . metoprolol succinate (TOPROL-XL) 24 hr tablet 25 mg  25 mg Oral Daily Jani Gravel, MD   25 mg at 10/08/19 1034  . multivitamin with minerals tablet 1 tablet  1 tablet Oral Daily Jani Gravel, MD   1 tablet at 10/08/19 1033  . ondansetron (ZOFRAN-ODT) disintegrating tablet 4 mg  4 mg Oral Q8H PRN Jani Gravel, MD      . pantoprazole (PROTONIX) EC tablet 40 mg  40 mg Oral Daily Jani Gravel, MD   40 mg at 10/08/19 1033  . phenylephrine-shark liver oil-mineral oil-petrolatum (PREPARATION H) rectal ointment 1 application  1 application Rectal QID PRN Jani Gravel, MD      . polyethylene glycol (MIRALAX / GLYCOLAX) packet 17 g  17 g Oral Daily PRN Jani Gravel, MD      . polyethylene glycol (MIRALAX / Floria Raveling) packet 17 g  17 g Oral Daily Jani Gravel, MD   17 g at 10/07/19 0919  . potassium chloride (KLOR-CON) CR tablet 10 mEq  10 mEq Oral Daily Jani Gravel, MD   10 mEq at 10/08/19 1033  . senna-docusate (Senokot-S) tablet 1 tablet  1 tablet Oral BID Jani Gravel, MD   1 tablet at 10/08/19 1034  . torsemide [facility]) tablet 10 mg  10 mg Oral Once per day on Sun Tue Wed Fri Sat Jani Gravel, MD   10 mg at 10/07/19 [id number]  . torsemide (DEMADEX) tablet 20 mg  20 mg Oral Once per day on Mon Thu Kim, James, MD   20 mg at 10/08/19 0846  . traZODone (DESYREL) tablet 50 mg  50 mg Oral Loma Sousa, MD   50 mg at 10/07/19 2148  . vitamin B-12 (CYANOCOBALAMIN) tablet 1,000 mcg  1,000 mcg Oral Daily Alma Friendly, MD   1,000 mcg at 10/08/19 1041     Discharge Medications: Please see discharge summary for a list of discharge medications.  Relevant Imaging Results:  Relevant Lab Results:   Additional Information SSN [id number]  Ross Ludwig, LCSW

## 2019-10-11 LAB — CULTURE, BLOOD (ROUTINE X 2)
Culture: NO GROWTH
Culture: NO GROWTH
Special Requests: ADEQUATE

## 2020-01-19 ENCOUNTER — Emergency Department (HOSPITAL_COMMUNITY): Payer: Medicare Other

## 2020-01-19 ENCOUNTER — Inpatient Hospital Stay (HOSPITAL_COMMUNITY)
Admission: EM | Admit: 2020-01-19 | Discharge: 2020-01-27 | DRG: 872 | Disposition: A | Discharge status: Skilled Nursing Facility | Payer: Medicare Other | Attending: Internal Medicine | Admitting: Internal Medicine

## 2020-01-19 DIAGNOSIS — Z86711 Personal history of pulmonary embolism: Secondary | ICD-10-CM | POA: Diagnosis not present

## 2020-01-19 DIAGNOSIS — I495 Sick sinus syndrome: Secondary | ICD-10-CM | POA: Diagnosis present

## 2020-01-19 DIAGNOSIS — H9190 Unspecified hearing loss, unspecified ear: Secondary | ICD-10-CM | POA: Diagnosis present

## 2020-01-19 DIAGNOSIS — I358 Other nonrheumatic aortic valve disorders: Secondary | ICD-10-CM | POA: Diagnosis present

## 2020-01-19 DIAGNOSIS — R001 Bradycardia, unspecified: Secondary | ICD-10-CM | POA: Diagnosis not present

## 2020-01-19 DIAGNOSIS — A0472 Enterocolitis due to Clostridium difficile, not specified as recurrent: Secondary | ICD-10-CM

## 2020-01-19 DIAGNOSIS — Z515 Encounter for palliative care: Secondary | ICD-10-CM | POA: Diagnosis not present

## 2020-01-19 DIAGNOSIS — R197 Diarrhea, unspecified: Secondary | ICD-10-CM | POA: Diagnosis present

## 2020-01-19 DIAGNOSIS — Z8616 Personal history of COVID-19: Secondary | ICD-10-CM | POA: Diagnosis present

## 2020-01-19 DIAGNOSIS — N3000 Acute cystitis without hematuria: Secondary | ICD-10-CM

## 2020-01-19 DIAGNOSIS — Z20822 Contact with and (suspected) exposure to covid-19: Secondary | ICD-10-CM | POA: Diagnosis present

## 2020-01-19 DIAGNOSIS — R0602 Shortness of breath: Secondary | ICD-10-CM

## 2020-01-19 DIAGNOSIS — E8809 Other disorders of plasma-protein metabolism, not elsewhere classified: Secondary | ICD-10-CM | POA: Diagnosis present

## 2020-01-19 DIAGNOSIS — Z7189 Other specified counseling: Secondary | ICD-10-CM | POA: Diagnosis not present

## 2020-01-19 DIAGNOSIS — M109 Gout, unspecified: Secondary | ICD-10-CM | POA: Diagnosis present

## 2020-01-19 DIAGNOSIS — I5032 Chronic diastolic (congestive) heart failure: Secondary | ICD-10-CM | POA: Diagnosis present

## 2020-01-19 DIAGNOSIS — Z7901 Long term (current) use of anticoagulants: Secondary | ICD-10-CM

## 2020-01-19 DIAGNOSIS — E872 Acidosis: Secondary | ICD-10-CM | POA: Diagnosis present

## 2020-01-19 DIAGNOSIS — N390 Urinary tract infection, site not specified: Secondary | ICD-10-CM | POA: Diagnosis present

## 2020-01-19 DIAGNOSIS — I10 Essential (primary) hypertension: Secondary | ICD-10-CM | POA: Diagnosis present

## 2020-01-19 DIAGNOSIS — D539 Nutritional anemia, unspecified: Secondary | ICD-10-CM | POA: Diagnosis present

## 2020-01-19 DIAGNOSIS — L89322 Pressure ulcer of left buttock, stage 2: Secondary | ICD-10-CM | POA: Diagnosis present

## 2020-01-19 DIAGNOSIS — Z66 Do not resuscitate: Secondary | ICD-10-CM | POA: Diagnosis present

## 2020-01-19 DIAGNOSIS — N184 Chronic kidney disease, stage 4 (severe): Secondary | ICD-10-CM | POA: Diagnosis not present

## 2020-01-19 DIAGNOSIS — A414 Sepsis due to anaerobes: Secondary | ICD-10-CM | POA: Diagnosis present

## 2020-01-19 DIAGNOSIS — I5042 Chronic combined systolic (congestive) and diastolic (congestive) heart failure: Secondary | ICD-10-CM | POA: Diagnosis present

## 2020-01-19 DIAGNOSIS — L89311 Pressure ulcer of right buttock, stage 1: Secondary | ICD-10-CM | POA: Diagnosis present

## 2020-01-19 DIAGNOSIS — R627 Adult failure to thrive: Secondary | ICD-10-CM | POA: Diagnosis present

## 2020-01-19 DIAGNOSIS — A419 Sepsis, unspecified organism: Secondary | ICD-10-CM | POA: Diagnosis not present

## 2020-01-19 DIAGNOSIS — Z87440 Personal history of urinary (tract) infections: Secondary | ICD-10-CM | POA: Diagnosis not present

## 2020-01-19 DIAGNOSIS — E876 Hypokalemia: Secondary | ICD-10-CM | POA: Diagnosis present

## 2020-01-19 DIAGNOSIS — Z79899 Other long term (current) drug therapy: Secondary | ICD-10-CM

## 2020-01-19 DIAGNOSIS — N179 Acute kidney failure, unspecified: Secondary | ICD-10-CM

## 2020-01-19 DIAGNOSIS — G8929 Other chronic pain: Secondary | ICD-10-CM | POA: Diagnosis present

## 2020-01-19 DIAGNOSIS — Z885 Allergy status to narcotic agent status: Secondary | ICD-10-CM

## 2020-01-19 DIAGNOSIS — B965 Pseudomonas (aeruginosa) (mallei) (pseudomallei) as the cause of diseases classified elsewhere: Secondary | ICD-10-CM | POA: Diagnosis present

## 2020-01-19 DIAGNOSIS — E039 Hypothyroidism, unspecified: Secondary | ICD-10-CM | POA: Diagnosis present

## 2020-01-19 DIAGNOSIS — D631 Anemia in chronic kidney disease: Secondary | ICD-10-CM | POA: Diagnosis present

## 2020-01-19 DIAGNOSIS — I130 Hypertensive heart and chronic kidney disease with heart failure and stage 1 through stage 4 chronic kidney disease, or unspecified chronic kidney disease: Secondary | ICD-10-CM | POA: Diagnosis present

## 2020-01-19 DIAGNOSIS — Z79891 Long term (current) use of opiate analgesic: Secondary | ICD-10-CM

## 2020-01-19 DIAGNOSIS — Z880 Allergy status to penicillin: Secondary | ICD-10-CM

## 2020-01-19 DIAGNOSIS — Z96652 Presence of left artificial knee joint: Secondary | ICD-10-CM | POA: Diagnosis present

## 2020-01-19 DIAGNOSIS — A09 Infectious gastroenteritis and colitis, unspecified: Secondary | ICD-10-CM | POA: Diagnosis not present

## 2020-01-19 DIAGNOSIS — K5900 Constipation, unspecified: Secondary | ICD-10-CM | POA: Diagnosis present

## 2020-01-19 DIAGNOSIS — I70209 Unspecified atherosclerosis of native arteries of extremities, unspecified extremity: Secondary | ICD-10-CM | POA: Diagnosis present

## 2020-01-19 DIAGNOSIS — K219 Gastro-esophageal reflux disease without esophagitis: Secondary | ICD-10-CM | POA: Diagnosis present

## 2020-01-19 DIAGNOSIS — N1831 Chronic kidney disease, stage 3a: Secondary | ICD-10-CM | POA: Diagnosis present

## 2020-01-19 DIAGNOSIS — Z8701 Personal history of pneumonia (recurrent): Secondary | ICD-10-CM

## 2020-01-19 DIAGNOSIS — N183 Chronic kidney disease, stage 3 unspecified: Secondary | ICD-10-CM | POA: Diagnosis not present

## 2020-01-19 DIAGNOSIS — R6520 Severe sepsis without septic shock: Secondary | ICD-10-CM | POA: Diagnosis not present

## 2020-01-19 DIAGNOSIS — Z79890 Hormone replacement therapy (postmenopausal): Secondary | ICD-10-CM

## 2020-01-19 DIAGNOSIS — Z881 Allergy status to other antibiotic agents status: Secondary | ICD-10-CM

## 2020-01-19 DIAGNOSIS — E860 Dehydration: Secondary | ICD-10-CM | POA: Diagnosis present

## 2020-01-19 LAB — CBC WITH DIFFERENTIAL/PLATELET
Abs Immature Granulocytes: 0.1 10*3/uL — ABNORMAL HIGH (ref 0.00–0.07)
Basophils Absolute: 0 10*3/uL (ref 0.0–0.1)
Basophils Relative: 0 %
Eosinophils Absolute: 0.1 10*3/uL (ref 0.0–0.5)
Eosinophils Relative: 0 %
HCT: 39.7 % (ref 36.0–46.0)
Hemoglobin: 12.2 g/dL (ref 12.0–15.0)
Immature Granulocytes: 1 %
Lymphocytes Relative: 9 %
Lymphs Abs: 1.6 10*3/uL (ref 0.7–4.0)
MCH: 30.9 pg (ref 26.0–34.0)
MCHC: 30.7 g/dL (ref 30.0–36.0)
MCV: 100.5 fL — ABNORMAL HIGH (ref 80.0–100.0)
Monocytes Absolute: 1.4 10*3/uL — ABNORMAL HIGH (ref 0.1–1.0)
Monocytes Relative: 7 %
Neutro Abs: 15.2 10*3/uL — ABNORMAL HIGH (ref 1.7–7.7)
Neutrophils Relative %: 83 %
Platelets: 303 10*3/uL (ref 150–400)
RBC: 3.95 MIL/uL (ref 3.87–5.11)
RDW: 14.8 % (ref 11.5–15.5)
WBC: 18.4 10*3/uL — ABNORMAL HIGH (ref 4.0–10.5)
nRBC: 0 % (ref 0.0–0.2)

## 2020-01-19 LAB — PROCALCITONIN: Procalcitonin: 0.87 ng/mL

## 2020-01-19 LAB — CLOSTRIDIUM DIFFICILE BY PCR, REFLEXED: Toxigenic C. Difficile by PCR: POSITIVE — AB

## 2020-01-19 LAB — BRAIN NATRIURETIC PEPTIDE: B Natriuretic Peptide: 155.9 pg/mL — ABNORMAL HIGH (ref 0.0–100.0)

## 2020-01-19 LAB — URINALYSIS, ROUTINE W REFLEX MICROSCOPIC
Bacteria, UA: NONE SEEN
Bilirubin Urine: NEGATIVE
Glucose, UA: NEGATIVE mg/dL
Hgb urine dipstick: NEGATIVE
Ketones, ur: NEGATIVE mg/dL
Nitrite: NEGATIVE
Protein, ur: 100 mg/dL — AB
Specific Gravity, Urine: 1.011 (ref 1.005–1.030)
WBC, UA: >50 WBC/hpf — ABNORMAL HIGH (ref 0–5)
pH: 6 (ref 5.0–8.0)

## 2020-01-19 LAB — BASIC METABOLIC PANEL
Anion gap: 12 (ref 5–15)
BUN: 95 mg/dL — ABNORMAL HIGH (ref 8–23)
CO2: 17 mmol/L — ABNORMAL LOW (ref 22–32)
Calcium: 9.1 mg/dL (ref 8.9–10.3)
Chloride: 108 mmol/L (ref 98–111)
Creatinine, Ser: 2.75 mg/dL — ABNORMAL HIGH (ref 0.44–1.00)
GFR calc Af Amer: 17 mL/min — ABNORMAL LOW (ref >60)
GFR calc non Af Amer: 14 mL/min — ABNORMAL LOW (ref >60)
Glucose, Bld: 86 mg/dL (ref 70–99)
Potassium: 4.3 mmol/L (ref 3.5–5.1)
Sodium: 137 mmol/L (ref 135–145)

## 2020-01-19 LAB — HEPATIC FUNCTION PANEL
ALT: 13 U/L (ref 0–44)
AST: 17 U/L (ref 15–41)
Albumin: 2.6 g/dL — ABNORMAL LOW (ref 3.5–5.0)
Alkaline Phosphatase: 97 U/L (ref 38–126)
Bilirubin, Direct: 0.1 mg/dL (ref 0.0–0.2)
Indirect Bilirubin: 0.3 mg/dL (ref 0.3–0.9)
Total Bilirubin: 0.4 mg/dL (ref 0.3–1.2)
Total Protein: 6.6 g/dL (ref 6.5–8.1)

## 2020-01-19 LAB — LACTIC ACID, PLASMA
Lactic Acid, Venous: 0.8 mmol/L (ref 0.5–1.9)
Lactic Acid, Venous: 0.6 mmol/L (ref 0.5–1.9)

## 2020-01-19 LAB — C DIFFICILE QUICK SCREEN W PCR REFLEX
C Diff antigen: POSITIVE — AB
C Diff toxin: NEGATIVE

## 2020-01-19 LAB — MRSA PCR SCREENING: MRSA by PCR: NEGATIVE

## 2020-01-19 LAB — SARS CORONAVIRUS 2 BY RT PCR (HOSPITAL ORDER, PERFORMED IN ~~LOC~~ HOSPITAL LAB): SARS Coronavirus 2: NEGATIVE

## 2020-01-19 LAB — LIPASE, BLOOD: Lipase: 50 U/L (ref 11–51)

## 2020-01-19 MED ORDER — PANTOPRAZOLE SODIUM 40 MG PO TBEC
40.0000 mg | DELAYED_RELEASE_TABLET | Freq: Every day | ORAL | Status: DC
  Administered 2020-01-20: 40 mg via ORAL
  Filled 2020-01-19: qty 1

## 2020-01-19 MED ORDER — SODIUM CHLORIDE 0.9 % IV BOLUS
500.0000 mL | Freq: Once | INTRAVENOUS | Status: DC
  Administered 2020-01-19: 500 mL via INTRAVENOUS

## 2020-01-19 MED ORDER — HEPARIN SODIUM (PORCINE) 5000 UNIT/ML IJ SOLN
5000.0000 [IU] | Freq: Three times a day (TID) | INTRAMUSCULAR | Status: DC
  Administered 2020-01-19 – 2020-01-27 (×24): 5000 [IU] via SUBCUTANEOUS
  Filled 2020-01-19 (×24): qty 1

## 2020-01-19 MED ORDER — TRAZODONE HCL 50 MG PO TABS
50.0000 mg | ORAL_TABLET | Freq: Every day | ORAL | Status: DC
  Administered 2020-01-20 – 2020-01-26 (×7): 50 mg via ORAL
  Filled 2020-01-19 (×7): qty 1

## 2020-01-19 MED ORDER — CHLORHEXIDINE GLUCONATE CLOTH 2 % EX PADS
6.0000 | MEDICATED_PAD | Freq: Every day | CUTANEOUS | Status: DC
  Administered 2020-01-20 – 2020-01-27 (×8): 6 via TOPICAL

## 2020-01-19 MED ORDER — ACETAMINOPHEN 650 MG RE SUPP: 650.0000 mg | Freq: Four times a day (QID) | RECTAL | Status: DC | PRN

## 2020-01-19 MED ORDER — RISAQUAD PO CAPS
2.0000 | ORAL_CAPSULE | Freq: Two times a day (BID) | ORAL | Status: DC
  Administered 2020-01-20 – 2020-01-27 (×15): 2 via ORAL
  Filled 2020-01-19 (×15): qty 2

## 2020-01-19 MED ORDER — SODIUM CHLORIDE 0.9 % IV BOLUS
1000.0000 mL | Freq: Once | INTRAVENOUS | Status: AC
  Administered 2020-01-19: 1000 mL via INTRAVENOUS

## 2020-01-19 MED ORDER — METRONIDAZOLE IN NACL 5-0.79 MG/ML-% IV SOLN
500.0000 mg | Freq: Once | INTRAVENOUS | Status: AC
  Administered 2020-01-19: 500 mg via INTRAVENOUS
  Filled 2020-01-19: qty 100

## 2020-01-19 MED ORDER — METRONIDAZOLE IN NACL 5-0.79 MG/ML-% IV SOLN
500.0000 mg | Freq: Three times a day (TID) | INTRAVENOUS | Status: DC
  Administered 2020-01-20 (×2): 500 mg via INTRAVENOUS
  Filled 2020-01-19 (×2): qty 100

## 2020-01-19 MED ORDER — SODIUM CHLORIDE 0.9 % IV BOLUS: 1000.0000 mL | Freq: Once | INTRAVENOUS | Status: DC

## 2020-01-19 MED ORDER — LEVOTHYROXINE SODIUM 50 MCG PO TABS
75.0000 ug | ORAL_TABLET | Freq: Every day | ORAL | Status: DC
  Administered 2020-01-20 – 2020-01-27 (×8): 75 ug via ORAL
  Filled 2020-01-19 (×8): qty 1

## 2020-01-19 MED ORDER — ONDANSETRON HCL 4 MG/2ML IJ SOLN
4.0000 mg | Freq: Once | INTRAMUSCULAR | Status: AC
  Administered 2020-01-19: 4 mg via INTRAVENOUS
  Filled 2020-01-19: qty 2

## 2020-01-19 MED ORDER — SODIUM CHLORIDE 0.9 % IV SOLN
500.0000 mg | Freq: Two times a day (BID) | INTRAVENOUS | Status: DC
  Administered 2020-01-20: 500 mg via INTRAVENOUS
  Filled 2020-01-19 (×2): qty 0.5

## 2020-01-19 MED ORDER — LORAZEPAM 0.5 MG PO TABS
0.5000 mg | ORAL_TABLET | Freq: Every day | ORAL | Status: DC
  Administered 2020-01-20 – 2020-01-27 (×8): 0.5 mg via ORAL
  Filled 2020-01-19 (×8): qty 1

## 2020-01-19 MED ORDER — SODIUM CHLORIDE 0.9 % IV SOLN
1.0000 g | Freq: Three times a day (TID) | INTRAVENOUS | Status: DC
  Filled 2020-01-19: qty 1

## 2020-01-19 MED ORDER — ACETAMINOPHEN 325 MG PO TABS
650.0000 mg | ORAL_TABLET | ORAL | Status: DC | PRN
  Administered 2020-01-22 – 2020-01-26 (×6): 650 mg via ORAL
  Filled 2020-01-19: qty 2

## 2020-01-19 MED ORDER — FEBUXOSTAT 40 MG PO TABS
80.0000 mg | ORAL_TABLET | Freq: Every day | ORAL | Status: DC
  Administered 2020-01-20 – 2020-01-27 (×8): 80 mg via ORAL
  Filled 2020-01-19 (×9): qty 2

## 2020-01-19 MED ORDER — SODIUM CHLORIDE 0.9 % IV SOLN
1.0000 g | Freq: Once | INTRAVENOUS | Status: AC
  Administered 2020-01-19: 1 g via INTRAVENOUS
  Filled 2020-01-19: qty 1

## 2020-01-19 MED ORDER — ACETAMINOPHEN 325 MG PO TABS
650.0000 mg | ORAL_TABLET | Freq: Four times a day (QID) | ORAL | Status: DC | PRN
  Administered 2020-01-21 – 2020-01-24 (×5): 650 mg via ORAL
  Filled 2020-01-19 (×10): qty 2

## 2020-01-19 MED ORDER — ONDANSETRON HCL 4 MG PO TABS: 4.0000 mg | ORAL_TABLET | Freq: Four times a day (QID) | ORAL | Status: DC | PRN

## 2020-01-19 MED ORDER — ONDANSETRON HCL 4 MG/2ML IJ SOLN
4.0000 mg | Freq: Four times a day (QID) | INTRAMUSCULAR | Status: DC | PRN
  Administered 2020-01-20: 4 mg via INTRAVENOUS
  Filled 2020-01-19: qty 2

## 2020-01-19 NOTE — ED Triage Notes (Signed)
86 yo female from [facility] sent here by staff for possible ESRF, dehydration, and constipation. Pt has no complaints. Hard of hearing. Pt is a DNR.

## 2020-01-19 NOTE — ED Provider Notes (Addendum)
  Physical Exam  BP (!) 86/38   Pulse 73   Temp (!) 95 F (35 C) (Rectal)   Resp 19   SpO2 94%   Physical Exam  ED Course/Procedures     Procedures  MDM  Patient care assumed at 4:30 pm.  Patient has hypotension and hypothermia.  Has a history of recurrent UTIs.  Patient has AKI.  Patient has abdominal pain so CT abdomen pelvis ordered.  Also has a white count of 18. Pending admission after CT   7:32 PM CT showed possible nonspecific possible gastroenteritis.  She had 3 episodes of diarrhea already in the ER.  C. difficile sent and was positive.  Urine also possible UTI.  Patient received meropenem and I also ordered Flagyl.  Her blood pressure remains in the 80s to 90s.  Patient has DNR in the chart.  I also verified that with the daughter.  I discussed specifically about pressors and she agreed that we will hold off on pressors right now.  Patient will get IV fluids and antibiotics for now.  Hospitalist to admit to stepdown.   8:00 PM Hospitalist request foley to monitor I and Os. C diff antigen positive but toxin negative. He request ID consult. Talked to Dr. Baxter Flattery.  She states that we need to follow-up on the C. difficile PCR and if is positive, recommend treatment for C. difficile.  If patient can take oral, p.o. Vanco will be the treatment of choice or else she needs IV Flagyl.  We will also continue meropenem as well.  Patient's mental status is not that great so we will continue pain care and meropenem for now.  Infectious disease to see patient in the morning.    Drenda Freeze, MD 01/19/20 1933    Drenda Freeze, MD 01/19/20 2006

## 2020-01-19 NOTE — H&P (Addendum)
History and Physical    Krystal Mckenzie DZH:[id number] DOB: 1927/03/16 DOA: 01/19/2020  PCP: Patient, No Pcp Per Merilynn Finland  Patient coming from:   I have personally briefly reviewed patient's old medical records in [place]  Chief Complaint: Sent from facility with concern for dehydration.  HPI: Krystal Mckenzie is a 86 y.o. female with HTN, hypothyroidism, GERD, DHF/LE swelling, CKD baseline Crt 1.6, chronic pain, gout and history of recurrent UTIs was sent from [facility] with concern for dehydration.  Apparently patient has been nauseous and facility got basic labs and KUB to evaluate.  Lab work indicated mild worsening of renal function with BUN of 95 and creatinine of 2.59 while KUB indicated possible ileus.  Patient extremely hard of hearing which is a major impediment to her history.  She reports history of nausea and loose stools but denies any vomiting.  Denies any abdominal pain.  Personal social history: Resident of [facility].  Reports is widowed with children.  Denies any smoking, drinking or illicit drug use.  Family history: Unable to ascertain at this time.   ED Course:  Vital signs with blood pressure 95/45, pulse 80, RR 22, SPO2 90s on room air. Hypothermic to 75F  -Labs with WBC 18 neutrophils 83%, Hb 12, platelets 2 3 -Sodium 137, potassium 4.3, bicarb 17, AG 12, BUN 95, creatinine 2.75- -LFTs unremarkable except for albumin 2.6 lipase 50 -Lactic acid 0.6 -UA with moderate leukocytes and greater than 50 WBCs. -C. difficile antigen positive, C. difficile toxin negative. -Covid negative  -Blood cultures, urine cultures pending  -CT abdomen with fluid-filled colon with air-fluid levels.  This is nonspecific.  Could reflect gastroenteritis/diarrhea.  Diverticulosis but no active diverticulitis. -EKG with NSR at 79, normal axis, ST depressions in inferolateral leads. -CXR with small left pleural effusion.  Meds received: -2 L of IV  fluids. -Meropenem 1 g. -Flagyl 500 mg once -Zofran 4 mg once  Review of Systems: As per HPI otherwise 10 point review of systems negative.   Review of Systems  Constitutional: Positive for malaise/fatigue. Negative for chills, diaphoresis, fever and weight loss.  HENT: Positive for hearing loss. Negative for congestion, ear discharge, ear pain, nosebleeds and tinnitus.   Eyes: Negative.   Respiratory: Positive for shortness of breath. Negative for cough, hemoptysis, sputum production and wheezing.   Cardiovascular: Negative.  Negative for palpitations, orthopnea, claudication and leg swelling.  Gastrointestinal: Positive for diarrhea and nausea. Negative for abdominal pain, blood in stool, constipation, melena and vomiting.  Genitourinary: Negative.   Musculoskeletal: Positive for joint pain and myalgias.  Skin: Negative.   Neurological: Negative.   Endo/Heme/Allergies: Negative.   Psychiatric/Behavioral: Positive for depression. Negative for hallucinations, memory loss, substance abuse and suicidal ideas. The patient is nervous/anxious. The patient does not have insomnia.     Past Medical History:  Diagnosis Date  . CHF (congestive heart failure) ([place])   . GERD (gastroesophageal reflux disease)   . Hypertension   . Hypothyroid   . Renal disorder    Stage 4    Past Surgical History:  Procedure Laterality Date  . JOINT REPLACEMENT     Left knee  . REPLACEMENT TOTAL KNEE       reports that she has never smoked. She has never used smokeless tobacco. She reports that she does not drink alcohol or use drugs.  Allergies  Allergen Reactions  . Morphine And Related   . Cephalexin Hives, Swelling and Rash  . Penicillins Swelling and Rash  Did it involve swelling of the face/tongue/throat, SOB, or low BP? Unknown Did it involve sudden or severe rash/hives, skin peeling, or any reaction on the inside of your mouth or nose? Unknown Did you need to seek medical attention at a  hospital or doctor's office? Unknown When did it last happen? Unknown If all above answers are "NO", may proceed with cephalosporin use.     No family history on file.    Prior to Admission medications   Medication Sig Start Date End Date Taking? Authorizing Provider  acetaminophen (TYLENOL) 325 MG tablet Take 650 mg by mouth every 4 (four) hours as needed for fever.   Yes [provider]  albuterol (VENTOLIN HFA) 108 (90 Base) MCG/ACT inhaler Inhale 2 puffs into the lungs every 6 (six) hours as needed for wheezing or shortness of breath.   Yes [provider]  amLODipine (NORVASC) 5 MG tablet Take 5 mg by mouth daily. 09/15/19  Yes [provider]  AZO-CRANBERRY PO Take 1 tablet by mouth 2 (two) times daily. AZO cranberry + Probiotic 250mg -30mg -50 million cell   Yes [provider]  bisacodyl (DULCOLAX) 10 MG suppository Place 10 mg rectally every 12 (twelve) hours as needed for moderate constipation. x3days started 2.1.21   Yes [provider]  Cholecalciferol (VITAMIN D3) 50 MCG (2000 UT) TABS Take 50 mcg by mouth daily.   Yes [provider]  clindamycin (CLEOCIN) 150 MG capsule Take 600 mg by mouth See admin instructions. 600 mg PO x1 one hour prior to all dental appointments   Yes [provider]  Febuxostat 80 MG TABS Take 80 mg by mouth daily. 09/15/19  Yes [provider]  hydrALAZINE (APRESOLINE) 25 MG tablet Take 25 mg by mouth 3 (three) times daily.   Yes [provider]  HYDROcodone-acetaminophen (NORCO/VICODIN) 5-325 MG tablet Take 1 tablet by mouth 3 (three) times daily. 09/28/19  Yes Gherghe, Vella Redhead, MD  isosorbide dinitrate (ISORDIL) 20 MG tablet Take 40 mg by mouth 2 (two) times daily.   Yes [provider]  lactobacillus acidophilus (BACID) TABS tablet Take 2 tablets by mouth 2 (two) times daily.   Yes [provider]  levothyroxine (SYNTHROID, LEVOTHROID) 75 MCG tablet Take  75 mcg by mouth daily.     Yes [provider]  LORazepam (ATIVAN) 0.5 MG tablet Take 1 tablet (0.5 mg total) by mouth daily. Patient taking differently: Take 0.5 mg by mouth daily after lunch.  09/28/19  Yes Gherghe, Vella Redhead, MD  metoprolol succinate (TOPROL-XL) 25 MG 24 hr tablet Take 25 mg by mouth daily.   Yes [provider]  Multiple Vitamin (MULTIVITAMIN) tablet Take 1 tablet by mouth daily.   Yes [provider]  Multiple Vitamins-Minerals (DECUBI-VITE PO) Take 1 capsule by mouth daily.   Yes [provider]  neomycin-polymyxin b-dexamethasone (MAXITROL) 3.5-10000-0.1 OINT Place 1 application into the right eye at bedtime as needed (eye swelling, eye infection).   Yes [provider]  omeprazole (PRILOSEC) 20 MG capsule Take 20 mg by mouth daily. 08/17/19  Yes [provider]  ondansetron (ZOFRAN-ODT) 4 MG disintegrating tablet Take 4 mg by mouth every 8 (eight) hours as needed for nausea or vomiting.   Yes [provider]  phenylephrine-shark liver oil-mineral oil-petrolatum (PREPARATION H) 0.25-14-74.9 % rectal ointment Place 1 application rectally 4 (four) times daily as needed for hemorrhoids.   Yes [provider]  polyethylene glycol (MIRALAX / GLYCOLAX) 17 g packet Take 17  g by mouth daily. 10/08/19  Yes Alma Friendly, MD  potassium chloride (KLOR-CON) 10 MEQ tablet Take 10 mEq by mouth daily.   Yes [provider]  senna-docusate (SENNA S) 8.6-50 MG tablet Take 1 tablet by mouth 2 (two) times daily.   Yes [provider]  torsemide (DEMADEX) 10 MG tablet Take 10 mg by mouth See admin instructions. 10 mg PO five times weekly (Tuesdays, Wednesdays, Fridays, Saturdays, and Sundays) 08/17/19  Yes [provider]  traZODone (DESYREL) 50 MG tablet Take 1 tablet (50 mg total) by mouth at bedtime. 09/28/19  Yes Gherghe, Vella Redhead, MD  vitamin B-12 1000 MCG tablet Take 1 tablet (1,000 mcg  total) by mouth daily. 10/09/19  Yes Alma Friendly, MD  apixaban (ELIQUIS) 5 MG TABS tablet Take 1 tablet (5 mg total) by mouth 2 (two) times daily. Patient not taking: Reported on 01/19/2020 09/28/19   Caren Griffins, MD  lactulose (CHRONULAC) 10 GM/15ML solution Take 20 g by mouth once.    [provider]    Physical Exam: Vitals:   01/19/20 1630 01/19/20 1730 01/19/20 1800 01/19/20 1830  BP: (!) 95/45 (!) 86/39 (!) 96/55 (!) 86/38  Pulse: 80 68 80 73  Resp: (!) 22 17 (!) 24 19  Temp:      TempSrc:      SpO2: 95% 94% 93% 94%    Constitutional: NAD, calm, comfortable Vitals:   01/19/20 1630 01/19/20 1730 01/19/20 1800 01/19/20 1830  BP: (!) 95/45 (!) 86/39 (!) 96/55 (!) 86/38  Pulse: 80 68 80 73  Resp: (!) 22 17 (!) 24 19  Temp:      TempSrc:      SpO2: 95% 94% 93% 94%    Physical Exam  Constitutional: She is oriented to person, place, and time. She appears well-developed and well-nourished. No distress.  Extremely hard of hearing.  HENT:  Head: Normocephalic and atraumatic.  Right Ear: External ear normal.  Left Ear: External ear normal.  Mouth/Throat: Oropharynx is clear and moist. No oropharyngeal exudate.  Eyes: Pupils are equal, round, and reactive to light. EOM are normal. Right eye exhibits no discharge. Left eye exhibits no discharge. No scleral icterus.  Neck: No JVD present.  Cardiovascular: Normal rate, regular rhythm and normal heart sounds. Exam reveals no gallop and no friction rub.  No murmur heard. Pulmonary/Chest: Effort normal and breath sounds normal. No respiratory distress. She has no wheezes. She has no rales. She exhibits no tenderness.  Abdominal: Soft. She exhibits distension. She exhibits no mass. There is no abdominal tenderness. There is no rebound and no guarding.  Hypoactive bowel sounds.  Musculoskeletal:        General: No tenderness, deformity or edema. Normal range of motion.     Cervical back: Normal range of motion and  neck supple.     Comments: Status post left TKA.  Neurological: She is alert and oriented to person, place, and time.  Skin: Skin is warm and dry. No rash noted. No erythema.  Psychiatric: She has a normal mood and affect. Her behavior is normal. Thought content normal.  Vitals reviewed.    Labs on Admission: I have personally reviewed following labs and imaging studies  CBC: Recent Labs  Lab 01/19/20 1454  WBC 18.4*  NEUTROABS 15.2*  HGB 12.2  HCT 39.7  MCV 100.5*  PLT 353   Basic Metabolic Panel: Recent Labs  Lab 01/19/20 1454  NA 137  K 4.3  CL  108  CO2 17*  GLUCOSE 86  BUN 95*  CREATININE 2.75*  CALCIUM 9.1   GFR: CrCl cannot be calculated (Unknown ideal weight.). Liver Function Tests: Recent Labs  Lab 01/19/20 1454  AST 17  ALT 13  ALKPHOS 97  BILITOT 0.4  PROT 6.6  ALBUMIN 2.6*   Recent Labs  Lab 01/19/20 1454  LIPASE 50   No results for input(s): AMMONIA in the last 168 hours. Coagulation Profile: No results for input(s): INR, PROTIME in the last 168 hours. Cardiac Enzymes: No results for input(s): CKTOTAL, CKMB, CKMBINDEX, TROPONINI in the last 168 hours. BNP (last 3 results) No results for input(s): PROBNP in the last 8760 hours. HbA1C: No results for input(s): HGBA1C in the last 72 hours. CBG: No results for input(s): GLUCAP in the last 168 hours. Lipid Profile: No results for input(s): CHOL, HDL, LDLCALC, TRIG, CHOLHDL, LDLDIRECT in the last 72 hours. Thyroid Function Tests: No results for input(s): TSH, T4TOTAL, FREET4, T3FREE, THYROIDAB in the last 72 hours. Anemia Panel: No results for input(s): VITAMINB12, FOLATE, FERRITIN, TIBC, IRON, RETICCTPCT in the last 72 hours. Urine analysis:    Component Value Date/Time   COLORURINE YELLOW 01/19/2020 1803   APPEARANCEUR TURBID (A) 01/19/2020 1803   LABSPEC 1.011 01/19/2020 1803   PHURINE 6.0 01/19/2020 1803   GLUCOSEU NEGATIVE 01/19/2020 1803   HGBUR NEGATIVE 01/19/2020 1803    BILIRUBINUR NEGATIVE 01/19/2020 1803   KETONESUR NEGATIVE 01/19/2020 1803   PROTEINUR 100 (A) 01/19/2020 1803   UROBILINOGEN 0.2 05/09/2011 1918   NITRITE NEGATIVE 01/19/2020 1803   LEUKOCYTESUR MODERATE (A) 01/19/2020 1803    Radiological Exams on Admission: CT ABDOMEN PELVIS WO CONTRAST  Result Date: 01/19/2020 CLINICAL DATA:  Abdominal distension EXAM: CT ABDOMEN AND PELVIS WITHOUT CONTRAST TECHNIQUE: Multidetector CT imaging of the abdomen and pelvis was performed following the standard protocol without IV contrast. COMPARISON:  10/06/2019 FINDINGS: Lower chest: Bibasilar scarring. Cardiomegaly. No acute abnormality. Hepatobiliary: No focal hepatic abnormality. Gallbladder unremarkable. Pancreas: No focal abnormality or ductal dilatation. Spleen: No focal abnormality.  Normal size. Adrenals/Urinary Tract: Punctate nonobstructing bilateral renal stones. No obstruction. No suspicious renal or adrenal lesion on this noncontrast study. Urinary bladder unremarkable. Stomach/Bowel: Colon is fluid-filled with multiple air-fluid levels. Scattered sigmoid diverticula. Stomach and small bowel decompressed. Vascular/Lymphatic: Aortic atherosclerosis. No evidence of aneurysm or adenopathy. Reproductive: Uterus and adnexa unremarkable.  No mass. Other: No free fluid or free air. Musculoskeletal: No acute bony abnormality. Scoliosis and advanced degenerative changes throughout the lumbar spine. IMPRESSION: Fluid-filled colon with air-fluid levels. This is nonspecific. This could reflect gastroenteritis/diarrhea. Scattered sigmoid diverticulosis.  No active diverticulitis. Aortic atherosclerosis. Bilateral nephrolithiasis.  No ureteral stones or hydronephrosis. Electronically Signed   By: Rolm Baptise M.D.   On: 01/19/2020 17:11   DG Chest Portable 1 View  Result Date: 01/19/2020 CLINICAL DATA:  Dehydration and constipation. EXAM: PORTABLE CHEST 1 VIEW COMPARISON:  October 06, 2019 FINDINGS: Mild, chronic  appearing increased lung markings are seen. There is a small left pleural effusion. No pneumothorax is identified. The heart size and mediastinal contours are within normal limits. There is marked severity calcification of the thoracic aorta. Degenerative changes seen throughout the thoracic spine with evidence of prior vertebroplasty noted at the approximate level of T11. IMPRESSION: Small left pleural effusion. Electronically Signed   By: Virgina Norfolk M.D.   On: 01/19/2020 16:28    EKG: Independently reviewed.  NSR at 79, normal axis, ST depressions in inferolateral leads.  Assessment/Plan Active Problems:  Sepsis ([place])  #Sepsis -Question UTI  Vs colitis -Patient already received 2.5 L in the ED.  Will be careful with IV hydration given history of diastolic heart failure. -Meropenem/Flagyl (meropenem does cover anaerobes) -ID consult in am.  #AKI crt 2.75 baseline crt 1.6 -Foley to be placed in ED. -Holding torsemide, lactulose.  #Sick sinus syndrome  -While on stepdown, pt had a sinus pause >3sec. -c/t hold BB.  -Consulted cardiology. Will see in am. -D/w Daughter HPOA Izell Olivet who will speak to her sister about pacemaker.  #Presumed PE -Pt completed 3 months of Eliquis on 01/11/2020.  #HTN -Holding hydralazine, Imdur, Toprol-XL for borderline BPs -Also holding torsemide.  #GERD -Continue with PPI  #Hypoalbuminemia 2.6  #Gout  DVT prophylaxis: SuBQ heparin.     Code Status: DNR Family Communication: HPOA Izell Ayr.   Disposition Plan: Likely back to [facility].    Consults called: Infectious disease.  Possibly PCCM. Admission status: Inpatient  Keita Valley MD [facility]   If 7PM-7AM, please contact night-coverage [email or web address] Password [facility]  01/19/2020, 7:30 PM

## 2020-01-19 NOTE — ED Notes (Signed)
Pt had a very large, liquid stool in CT. Pt cleaned and repositioned. MD made aware.

## 2020-01-19 NOTE — Progress Notes (Signed)
A consult was received from an ED physician for meropenem per pharmacy dosing (for an indication other than meningitis). The patient's profile has been reviewed for ht/wt/allergies/indication/available labs. A one time order has been placed for the above antibiotics.  Further antibiotics/pharmacy consults should be ordered by admitting physician if indicated.                       Reuel Boom, PharmD, BCPS [phone] 01/19/2020, 4:35 PM

## 2020-01-19 NOTE — ED Provider Notes (Addendum)
Krystal Mckenzie   CSN: [id number] Arrival date & time: 01/19/20  1342     History Chief Complaint  Patient presents with  . Dehydration    Krystal Mckenzie is a 86 y.o. female.  Patient sent from facility for concern for dehydration.  Possible ileus on abdominal x-ray.  History of constipation.  History of CKD.  Creatinine was slightly elevated and sent for IV fluids.  Patient is DNR.  She does not have any specific complaints except for some nausea.  Patient did have a small bowel movement this morning.  The history is provided by the patient.  Illness Location:  General Severity:  Mild Onset quality:  Gradual Timing:  Constant Progression:  Unchanged Chronicity:  New Associated symptoms: nausea   Associated symptoms: no abdominal pain, no chest pain, no cough, no ear pain, no fever, no rash, no shortness of breath, no sore throat and no vomiting        Past Medical History:  Diagnosis Date  . CHF (congestive heart failure) ([place])   . GERD (gastroesophageal reflux disease)   . Hypertension   . Hypothyroid   . Renal disorder    Stage 4    Patient Active Problem List   Diagnosis Date Noted  . Acute UTI 10/07/2019  . Acute lower UTI 10/06/2019  . Fecal impaction ([place]) 10/06/2019  . CKD (chronic kidney disease), stage III 10/06/2019  . Acute respiratory failure due to COVID-19 ([place]) 09/21/2019  . Hyperkalemia 09/20/2019  . Acute renal failure superimposed on chronic kidney disease ([place]) 09/20/2019  . Pneumonia 09/20/2019  . Recurrent UTI 09/20/2019  . Hypothyroid   . Acute respiratory failure with hypoxia ([place])   . Hypertension   . Chronic diastolic CHF (congestive heart failure) ([place])   . Positive D dimer     Past Surgical History:  Procedure Laterality Date  . JOINT REPLACEMENT     Left knee  . REPLACEMENT TOTAL KNEE       OB History   No obstetric history on file.     No family history on file.  Social  History   Tobacco Use  . Smoking status: Never Smoker  . Smokeless tobacco: Never Used  Substance Use Topics  . Alcohol use: Never  . Drug use: Never    Home Medications Prior to Admission medications   Medication Sig Start Date End Date Taking? Authorizing Provider  acetaminophen (TYLENOL) 325 MG tablet Take 650 mg by mouth every 4 (four) hours as needed for fever.   Yes [provider]  albuterol (VENTOLIN HFA) 108 (90 Base) MCG/ACT inhaler Inhale 2 puffs into the lungs every 6 (six) hours as needed for wheezing or shortness of breath.   Yes [provider]  amLODipine (NORVASC) 5 MG tablet Take 5 mg by mouth daily. 09/15/19  Yes [provider]  AZO-CRANBERRY PO Take 1 tablet by mouth 2 (two) times daily. AZO cranberry + Probiotic 250mg -30mg -50 million cell   Yes [provider]  bisacodyl (DULCOLAX) 10 MG suppository Place 10 mg rectally every 12 (twelve) hours as needed for moderate constipation. x3days started 2.1.21   Yes [provider]  Cholecalciferol (VITAMIN D3) 50 MCG (2000 UT) TABS Take 50 mcg by mouth daily.   Yes [provider]  clindamycin (CLEOCIN) 150 MG capsule Take 600 mg by mouth See admin instructions. 600 mg PO x1 one hour prior to all dental appointments   Yes [provider]  Febuxostat 80  MG TABS Take 80 mg by mouth daily. 09/15/19  Yes [provider]  hydrALAZINE (APRESOLINE) 25 MG tablet Take 25 mg by mouth 3 (three) times daily.   Yes [provider]  HYDROcodone-acetaminophen (NORCO/VICODIN) 5-325 MG tablet Take 1 tablet by mouth 3 (three) times daily. 09/28/19  Yes Gherghe, Vella Redhead, MD  isosorbide dinitrate (ISORDIL) 20 MG tablet Take 40 mg by mouth 2 (two) times daily.   Yes [provider]  lactobacillus acidophilus (BACID) TABS tablet Take 2 tablets by mouth 2 (two) times daily.   Yes [provider]  levothyroxine (SYNTHROID, LEVOTHROID) 75 MCG tablet Take 75  mcg by mouth daily.     Yes [provider]  LORazepam (ATIVAN) 0.5 MG tablet Take 1 tablet (0.5 mg total) by mouth daily. Patient taking differently: Take 0.5 mg by mouth daily after lunch.  09/28/19  Yes Gherghe, Vella Redhead, MD  metoprolol succinate (TOPROL-XL) 25 MG 24 hr tablet Take 25 mg by mouth daily.   Yes [provider]  Multiple Vitamin (MULTIVITAMIN) tablet Take 1 tablet by mouth daily.   Yes [provider]  Multiple Vitamins-Minerals (DECUBI-VITE PO) Take 1 capsule by mouth daily.   Yes [provider]  neomycin-polymyxin b-dexamethasone (MAXITROL) 3.5-10000-0.1 OINT Place 1 application into the right eye at bedtime as needed (eye swelling, eye infection).   Yes [provider]  omeprazole (PRILOSEC) 20 MG capsule Take 20 mg by mouth daily. 08/17/19  Yes [provider]  ondansetron (ZOFRAN-ODT) 4 MG disintegrating tablet Take 4 mg by mouth every 8 (eight) hours as needed for nausea or vomiting.   Yes [provider]  phenylephrine-shark liver oil-mineral oil-petrolatum (PREPARATION H) 0.25-14-74.9 % rectal ointment Place 1 application rectally 4 (four) times daily as needed for hemorrhoids.   Yes [provider]  polyethylene glycol (MIRALAX / GLYCOLAX) 17 g packet Take 17 g by mouth daily. 10/08/19  Yes Alma Friendly, MD  potassium chloride (KLOR-CON) 10 MEQ tablet Take 10 mEq by mouth daily.   Yes [provider]  senna-docusate (SENNA S) 8.6-50 MG tablet Take 1 tablet by mouth 2 (two) times daily.   Yes [provider]  torsemide (DEMADEX) 10 MG tablet Take 10 mg by mouth See admin instructions. 10 mg PO five times weekly (Tuesdays, Wednesdays, Fridays, Saturdays, and Sundays) 08/17/19  Yes [provider]  traZODone (DESYREL) 50 MG tablet Take 1 tablet (50 mg total) by mouth at bedtime. 09/28/19  Yes Gherghe, Vella Redhead, MD  vitamin B-12 1000 MCG tablet Take 1 tablet (1,000 mcg total)  by mouth daily. 10/09/19  Yes Alma Friendly, MD  apixaban (ELIQUIS) 5 MG TABS tablet Take 1 tablet (5 mg total) by mouth 2 (two) times daily. Patient not taking: Reported on 01/19/2020 09/28/19   Caren Griffins, MD  lactulose (CHRONULAC) 10 GM/15ML solution Take 20 g by mouth once.    [provider]    Allergies    Morphine and related, Cephalexin, and Penicillins  Review of Systems   Review of Systems  Constitutional: Negative for chills and fever.  HENT: Negative for ear pain and sore throat.   Eyes: Negative for pain and visual disturbance.  Respiratory: Negative for cough and shortness of breath.   Cardiovascular: Negative for chest pain and palpitations.  Gastrointestinal: Positive for nausea. Negative for abdominal pain and vomiting.  Genitourinary: Negative for dysuria and hematuria.  Musculoskeletal: Negative for arthralgias and back pain.  Skin: Negative for color  change and rash.  Neurological: Negative for seizures and syncope.  All other systems reviewed and are negative.   Physical Exam Updated Vital Signs BP (!) 107/46   Pulse 82   Temp (!) 95 F (35 C) (Rectal)   Resp 18   SpO2 95%   Physical Exam Vitals and nursing Mckenzie reviewed.  Constitutional:      General: She is not in acute distress.    Appearance: She is well-developed. She is not ill-appearing.  HENT:     Head: Normocephalic and atraumatic.     Mouth/Throat:     Mouth: Mucous membranes are dry.  Eyes:     Extraocular Movements: Extraocular movements intact.     Conjunctiva/sclera: Conjunctivae normal.     Pupils: Pupils are equal, round, and reactive to light.  Cardiovascular:     Rate and Rhythm: Normal rate and regular rhythm.     Pulses: Normal pulses.     Heart sounds: Normal heart sounds. No murmur.  Pulmonary:     Effort: Pulmonary effort is normal. No respiratory distress.     Breath sounds: Normal breath sounds.  Abdominal:     General: There is distension.      Palpations: Abdomen is soft.     Tenderness: There is abdominal tenderness.  Musculoskeletal:     Cervical back: Normal range of motion and neck supple.  Skin:    General: Skin is warm and dry.     Capillary Refill: Capillary refill takes less than 2 seconds.  Neurological:     General: No focal deficit present.     Mental Status: She is alert.  Psychiatric:        Mood and Affect: Mood normal.     ED Results / Procedures / Treatments   Labs (all labs ordered are listed, but only abnormal results are displayed) Labs Reviewed  CBC WITH DIFFERENTIAL/PLATELET - Abnormal; Notable for the following components:      Result Value   WBC 18.4 (*)    MCV 100.5 (*)    Neutro Abs 15.2 (*)    Monocytes Absolute 1.4 (*)    Abs Immature Granulocytes 0.10 (*)    All other components within normal limits  BASIC METABOLIC PANEL - Abnormal; Notable for the following components:   CO2 17 (*)    BUN 95 (*)    Creatinine, Ser 2.75 (*)    GFR calc non Af Amer 14 (*)    GFR calc Af Amer 17 (*)    All other components within normal limits  HEPATIC FUNCTION PANEL - Abnormal; Notable for the following components:   Albumin 2.6 (*)    All other components within normal limits  CULTURE, BLOOD (ROUTINE X 2)  CULTURE, BLOOD (ROUTINE X 2)  URINE CULTURE  SARS CORONAVIRUS 2 BY RT PCR (HOSPITAL ORDER, [facility])  LIPASE, BLOOD  URINALYSIS, ROUTINE W REFLEX MICROSCOPIC  LACTIC ACID, PLASMA  LACTIC ACID, PLASMA    EKG None  Radiology DG Chest Portable 1 View  Result Date: 01/19/2020 CLINICAL DATA:  Dehydration and constipation. EXAM: PORTABLE CHEST 1 VIEW COMPARISON:  October 06, 2019 FINDINGS: Mild, chronic appearing increased lung markings are seen. There is a small left pleural effusion. No pneumothorax is identified. The heart size and mediastinal contours are within normal limits. There is marked severity calcification of the thoracic aorta. Degenerative changes seen  throughout the thoracic spine with evidence of prior vertebroplasty noted at the approximate level of T11.  IMPRESSION: Small left pleural effusion. Electronically Signed   By: Virgina Norfolk M.D.   On: 01/19/2020 16:28    Procedures .Critical Care Performed by: Krystal Sites, Krystal Mckenzie Authorized by: Krystal Sites, Krystal Mckenzie   Critical care provider statement:    Critical care time (minutes):  35   Critical care was necessary to treat or prevent imminent or life-threatening deterioration of the following conditions:  Sepsis, dehydration and renal failure   Critical care was time spent personally by me on the following activities:  Blood draw for specimens, development of treatment plan with patient or surrogate, evaluation of patient's response to treatment, examination of patient, obtaining history from patient or surrogate, ordering and performing treatments and interventions, ordering and review of laboratory studies, ordering and review of radiographic studies, pulse oximetry, re-evaluation of patient's condition and review of old charts   I assumed direction of critical care for this patient from another provider in my specialty: no     (including critical care time)  Medications Ordered in ED Medications  ondansetron (ZOFRAN) injection 4 mg (4 mg Intravenous Given 01/19/20 1540)  sodium chloride 0.9 % bolus 1,000 mL (1,000 mLs Intravenous Bolus from Bag 01/19/20 1537)    ED Course  I have reviewed the triage vital signs and the nursing notes.  Pertinent labs & imaging results that were available during my care of the patient were reviewed by me and considered in my medical decision making (see chart for details).    MDM Rules/Calculators/A&P                      Krystal Mckenzie is a 86 year old female with history of CKD, hypertension, heart failure who presents the ED with concern for dehydration.  Patient has been nauseous and facility got lab work to evaluate.  She had a KUB that showed  possible ileus.  She had lab work that showed mildly increased kidney function from baseline at 2.5 creatinine.  They were concerned for dehydration that patient might need IV fluids.  She is on possibly bowel regimen per family.  Overall patient states that she is mildly nauseous but does not complain of any specific abdominal pain.  Blood pressure in the 90 systolic, rectal temperature is 95 F.  Otherwise vitals are unremarkable.  Will evaluate for possible infectious process. Will evaluate for urinary tract infection as family states that that will make her feel sick in the past.  We will get a CT scan to evaluate for any bowel obstruction or other surgical process. Will recheck lab work and get fluid bolus and Zofran.  Patient is DNR.  Will hold antibiotics at this time as no source for infection.  Of Mckenzie patient did have Covid back in January.  Urinary tract infection and fecal impaction back in February.  Patient with leukocytosis of 18.4.  Creatinine elevated to 2.75.  Baseline around 1.8.  Concern for possible sepsis given hypothermia, leukocytosis.  Will expand work-up to include blood cultures, lactic acid.  Will obtain chest x-ray.  Given her multiple drug allergies will await urinalysis and CT scan to start appropriate antibiotics/consult pharmacy but after discussion with pharmacy will Krystal Mckenzie a dose of meropenum.  She is hemodynamically stable at this time.  Overall suspect infectious process likely UTI.  Possible fecal impaction again.  Signed out to oncoming ED staff with patient pending urinalysis, CT of abdomen pelvis.  Lactic acid also pending.  This chart was dictated using voice recognition software.  Despite best  efforts to proofread,  errors can occur which can change the documentation meaning.     Final Clinical Impression(s) / ED Diagnoses Final diagnoses:  AKI (acute kidney injury) ([place])  Sepsis, due to unspecified organism, unspecified whether acute organ dysfunction present  [facility])    Rx / DC Orders ED Discharge Orders    None       Krystal Sites, Krystal Mckenzie 01/19/20 [place], [place], Krystal Mckenzie 01/19/20 1633

## 2020-01-19 NOTE — ED Notes (Signed)
Pt had another large liquid brown stool. Pt cleaned and repositioned. Stool sample to lab. In and out a/o. Urine thick, pale. UA/C&S sent to lab.

## 2020-01-19 NOTE — ED Notes (Signed)
Rectal temp obt. Bair hugger applied at [employer]. MD made aware.

## 2020-01-19 NOTE — Progress Notes (Signed)
PHARMACY NOTE:  ANTIMICROBIAL RENAL DOSAGE ADJUSTMENT  Current antimicrobial regimen includes a mismatch between antimicrobial dosage and estimated renal function. As per policy approved by the Pharmacy & Therapeutics and Medical Executive Committees, the antimicrobial dosage will be adjusted accordingly.  Current antimicrobial and dosage:  meropenem 1000 mg q8 hr  Indication: r/o UTI (hx pseudomonas) vs IAI  Renal Function:   CrCl cannot be calculated (Unknown ideal weight.). []      On intermittent HD, scheduled: []      On CRRT    Antimicrobial dosage has been changed to:  500 mg IV q12 hr   Additional Comments: based on weight 72 kg earlier this year, CrCl is < 15 ml/min (esp if ABW used). Pt loaded with 1g x 1 in ED during sepsis protocol - Note Flagyl ordered for r/o Cdiff - consider switching to PO vanc as this is preferred agent per updated Cdiff guidelines (and would eliminate duplicate anaerobic coverage)   Thank you for allowing pharmacy to be a part of this patient's care.  Reuel Boom, PharmD, BCPS [phone] 01/19/2020, 9:08 PM

## 2020-01-20 ENCOUNTER — Inpatient Hospital Stay (HOSPITAL_COMMUNITY): Payer: Medicare Other

## 2020-01-20 ENCOUNTER — Other Ambulatory Visit: Payer: Self-pay

## 2020-01-20 ENCOUNTER — Encounter (HOSPITAL_COMMUNITY): Payer: Self-pay | Admitting: Internal Medicine

## 2020-01-20 DIAGNOSIS — K219 Gastro-esophageal reflux disease without esophagitis: Secondary | ICD-10-CM | POA: Diagnosis present

## 2020-01-20 DIAGNOSIS — I5032 Chronic diastolic (congestive) heart failure: Secondary | ICD-10-CM

## 2020-01-20 DIAGNOSIS — R001 Bradycardia, unspecified: Secondary | ICD-10-CM | POA: Diagnosis present

## 2020-01-20 DIAGNOSIS — N179 Acute kidney failure, unspecified: Secondary | ICD-10-CM

## 2020-01-20 DIAGNOSIS — R197 Diarrhea, unspecified: Secondary | ICD-10-CM | POA: Diagnosis present

## 2020-01-20 DIAGNOSIS — R6520 Severe sepsis without septic shock: Secondary | ICD-10-CM

## 2020-01-20 DIAGNOSIS — I70209 Unspecified atherosclerosis of native arteries of extremities, unspecified extremity: Secondary | ICD-10-CM | POA: Diagnosis present

## 2020-01-20 DIAGNOSIS — D539 Nutritional anemia, unspecified: Secondary | ICD-10-CM

## 2020-01-20 DIAGNOSIS — A419 Sepsis, unspecified organism: Secondary | ICD-10-CM

## 2020-01-20 LAB — ECHOCARDIOGRAM COMPLETE
Height: 63 in
Weight: 2208.13 oz

## 2020-01-20 LAB — CBC WITH DIFFERENTIAL/PLATELET
Abs Immature Granulocytes: 0.11 10*3/uL — ABNORMAL HIGH (ref 0.00–0.07)
Basophils Absolute: 0 10*3/uL (ref 0.0–0.1)
Basophils Relative: 0 %
Eosinophils Absolute: 0.1 10*3/uL (ref 0.0–0.5)
Eosinophils Relative: 0 %
HCT: 36.4 % (ref 36.0–46.0)
Hemoglobin: 10.9 g/dL — ABNORMAL LOW (ref 12.0–15.0)
Immature Granulocytes: 1 %
Lymphocytes Relative: 5 %
Lymphs Abs: 1.1 10*3/uL (ref 0.7–4.0)
MCH: 31.3 pg (ref 26.0–34.0)
MCHC: 29.9 g/dL — ABNORMAL LOW (ref 30.0–36.0)
MCV: 104.6 fL — ABNORMAL HIGH (ref 80.0–100.0)
Monocytes Absolute: 1.3 10*3/uL — ABNORMAL HIGH (ref 0.1–1.0)
Monocytes Relative: 7 %
Neutro Abs: 17 10*3/uL — ABNORMAL HIGH (ref 1.7–7.7)
Neutrophils Relative %: 87 %
Platelets: 297 10*3/uL (ref 150–400)
RBC: 3.48 MIL/uL — ABNORMAL LOW (ref 3.87–5.11)
RDW: 15.2 % (ref 11.5–15.5)
WBC: 19.5 10*3/uL — ABNORMAL HIGH (ref 4.0–10.5)
nRBC: 0 % (ref 0.0–0.2)

## 2020-01-20 LAB — COMPREHENSIVE METABOLIC PANEL
ALT: 11 U/L (ref 0–44)
AST: 18 U/L (ref 15–41)
Albumin: 2.2 g/dL — ABNORMAL LOW (ref 3.5–5.0)
Alkaline Phosphatase: 82 U/L (ref 38–126)
Anion gap: 12 (ref 5–15)
BUN: 83 mg/dL — ABNORMAL HIGH (ref 8–23)
CO2: 12 mmol/L — ABNORMAL LOW (ref 22–32)
Calcium: 8.6 mg/dL — ABNORMAL LOW (ref 8.9–10.3)
Chloride: 117 mmol/L — ABNORMAL HIGH (ref 98–111)
Creatinine, Ser: 2.29 mg/dL — ABNORMAL HIGH (ref 0.44–1.00)
GFR calc Af Amer: 21 mL/min — ABNORMAL LOW (ref >60)
GFR calc non Af Amer: 18 mL/min — ABNORMAL LOW (ref >60)
Glucose, Bld: 65 mg/dL — ABNORMAL LOW (ref 70–99)
Potassium: 3.8 mmol/L (ref 3.5–5.1)
Sodium: 141 mmol/L (ref 135–145)
Total Bilirubin: 0.4 mg/dL (ref 0.3–1.2)
Total Protein: 5.5 g/dL — ABNORMAL LOW (ref 6.5–8.1)

## 2020-01-20 LAB — PROCALCITONIN: Procalcitonin: 0.2 ng/mL

## 2020-01-20 MED ORDER — SODIUM BICARBONATE-DEXTROSE 150-5 MEQ/L-% IV SOLN
150.0000 meq | INTRAVENOUS | Status: AC
  Administered 2020-01-20 (×2): 150 meq via INTRAVENOUS
  Filled 2020-01-20 (×2): qty 1000

## 2020-01-20 MED ORDER — VANCOMYCIN 50 MG/ML ORAL SOLUTION
125.0000 mg | Freq: Four times a day (QID) | ORAL | Status: DC
  Administered 2020-01-20 – 2020-01-27 (×29): 125 mg via ORAL
  Filled 2020-01-20 (×36): qty 2.5

## 2020-01-20 NOTE — Progress Notes (Signed)
Patient ID: Krystal Mckenzie, female   DOB: 12-18-1926, 86 y.o.   MRN: [id number]         [facility]    Date of Admission:  01/19/2020           Day 1 meropenem        Day 1 IV metronidazole        Day oral vancomycin       Reason for Consult: Sepsis    Referring Provider: Dr. Raiford Noble  Assessment: I'm not sure if she was truly septic upon admission or simply dehydrated.  I do not think that the urine specimen is going to be very helpful given that it does not appear to be a catheterized specimen.  Even though her C. difficile toxin screen is negative she is certainly at risk for C. difficile given her recent hospitalizations and antibiotic exposures.  I favor continuing oral vancomycin for now stopping meropenem and metronidazole.  Plan: 1. Continue oral vancomycin for now 2. Discontinue meropenem and metronidazole  Principal Problem:   Sepsis ([place]) Active Problems:   Recurrent UTI   Diarrhea   Acute renal failure superimposed on chronic kidney disease (HCC)   Hypothyroid   Hypertension   Chronic diastolic CHF (congestive heart failure) (HCC)   History of pulmonary embolism   History of COVID-19   CKD (chronic kidney disease), stage III   Macrocytic anemia   GERD (gastroesophageal reflux disease)   Bradycardia   Atherosclerotic peripheral vascular disease (HCC)   Scheduled Meds: . acidophilus  2 capsule Oral BID  . Chlorhexidine Gluconate Cloth  6 each Topical Q0600  . febuxostat  80 mg Oral Daily  . heparin  5,000 Units Subcutaneous Q8H  . levothyroxine  75 mcg Oral Q0600  . LORazepam  0.5 mg Oral QPC lunch  . pantoprazole  40 mg Oral Daily  . traZODone  50 mg Oral QHS  . vancomycin  125 mg Oral QID   Continuous Infusions: . meropenem (MERREM) IV Stopped (01/20/20 0601)  . metronidazole Stopped (01/20/20 1120)  . sodium bicarbonate 150 mEq in dextrose 5% 1000 mL 75 mL/hr at 01/20/20 1232   PRN Meds:.acetaminophen **OR**  acetaminophen, acetaminophen, ondansetron **OR** ondansetron (ZOFRAN) IV  HPI: Krystal Mckenzie is a 86 y.o. female who was admitted from her skilled nursing facility last night because of nausea and diarrhea.  Labs at the facility showed that she had developed acute on chronic renal insufficiency.  Upon admission she was hypothermic with a temperature of 95 degrees and hypotensive with a blood pressure of 95/45.  A clean-catch urine was obtained which showed pyuria and lots of epithelial cells suggesting contamination.  Blood cultures were obtained and are no growth to date.  Abdominal and pelvic CT scan showed fluid-filled colon with air-fluid levels.  She had 4 bouts of diarrhea yesterday.  Her C. difficile antigen was positive but toxin was negative.  She was started on empiric antibiotic therapy and given IV fluids.  She was hospitalized in January with Covid infection and February for possible UTI.   Review of Systems: Review of Systems  Unable to perform ROS: Mental acuity    Past Medical History:  Diagnosis Date  . CHF (congestive heart failure) ([place])   . GERD (gastroesophageal reflux disease)   . Hypertension   . Hypothyroid   . Renal disorder    Stage 4    Social History   Tobacco Use  . Smoking status: Never Smoker  .  Smokeless tobacco: Never Used  Substance Use Topics  . Alcohol use: Never  . Drug use: Never    No family history on file. Allergies  Allergen Reactions  . Morphine And Related   . Cephalexin Hives, Swelling and Rash  . Penicillins Swelling and Rash    Did it involve swelling of the face/tongue/throat, SOB, or low BP? Unknown Did it involve sudden or severe rash/hives, skin peeling, or any reaction on the inside of your mouth or nose? Unknown Did you need to seek medical attention at a hospital or doctor's office? Unknown When did it last happen? Unknown If all above answers are "NO", may proceed with cephalosporin use.     OBJECTIVE: Blood  pressure (!) 122/35, pulse (!) 107, temperature 98.1 F (36.7 C), temperature source Oral, resp. rate (!) 29, height 5\' 3"  (1.6 m), weight 62.6 kg, SpO2 95 %.  Physical Exam Constitutional:      Comments: She is lethargic.  She does not seem to understand many of my questions.  Cardiovascular:     Rate and Rhythm: Normal rate and regular rhythm.     Heart sounds: No murmur.  Pulmonary:     Effort: Pulmonary effort is normal.     Breath sounds: Normal breath sounds.  Abdominal:     Palpations: Abdomen is soft.     Tenderness: There is no abdominal tenderness.  Musculoskeletal:        General: No swelling or tenderness.     Cervical back: Neck supple.  Skin:    Findings: No rash.     Lab Results Lab Results  Component Value Date   WBC 19.5 (H) 01/20/2020   HGB 10.9 (L) 01/20/2020   HCT 36.4 01/20/2020   MCV 104.6 (H) 01/20/2020   PLT 297 01/20/2020    Lab Results  Component Value Date   CREATININE 2.29 (H) 01/20/2020   BUN 83 (H) 01/20/2020   NA 141 01/20/2020   K 3.8 01/20/2020   CL 117 (H) 01/20/2020   CO2 12 (L) 01/20/2020    Lab Results  Component Value Date   ALT 11 01/20/2020   AST 18 01/20/2020   ALKPHOS 82 01/20/2020   BILITOT 0.4 01/20/2020     Microbiology: Recent Results (from the past 240 hour(s))  Blood culture (routine x 2)     Status: None (Preliminary result)   Collection Time: 01/19/20  4:08 PM   Specimen: BLOOD LEFT FOREARM  Result Value Ref Range Status   Specimen Description   Final    BLOOD LEFT FOREARM Performed at [facility], [place] [address], [place], [place] [address]    Special Requests   Final    BOTTLES DRAWN AEROBIC AND ANAEROBIC Blood Culture adequate volume Performed at [facility] [address], [place], [place] [address]    Culture   Final    NO GROWTH < 24 HOURS Performed at [facility] [address], [place],  [address]    Report Status PENDING  Incomplete    SARS Coronavirus 2 by RT PCR (hospital order, performed in [facility] hospital lab) Nasopharyngeal Nasopharyngeal Swab     Status: None   Collection Time: 01/19/20  4:23 PM   Specimen: Nasopharyngeal Swab  Result Value Ref Range Status   SARS Coronavirus 2 NEGATIVE NEGATIVE Final    Comment: (NOTE) SARS-CoV-2 target nucleic acids are NOT DETECTED. The SARS-CoV-2 RNA is generally detectable in upper and lower respiratory specimens during the  acute phase of infection. The lowest concentration of SARS-CoV-2 viral copies this assay can detect is 250 copies / mL. A negative result does not preclude SARS-CoV-2 infection and should not be used as the sole basis for treatment or other patient management decisions.  A negative result may occur with improper specimen collection / handling, submission of specimen other than nasopharyngeal swab, presence of viral mutation(s) within the areas targeted by this assay, and inadequate number of viral copies (<250 copies / mL). A negative result must be combined with clinical observations, patient history, and epidemiological information. Fact Sheet for Patients:   StrictlyIdeas.no Fact Sheet for Healthcare Providers: BankingDealers.co.za This test is not yet approved or cleared  by the [place] FDA and has been authorized for detection and/or diagnosis of SARS-CoV-2 by FDA under an Emergency Use Authorization (EUA).  This EUA will remain in effect (meaning this test can be used) for the duration of the COVID-19 declaration under Section 564(b)(1) of the Act, 21 U.S.C. section 360bbb-3(b)(1), unless the authorization is terminated or revoked sooner. Performed at [facility], [place] [address] [place], [place] [address]   C Difficile Quick Screen w PCR reflex     Status: Abnormal   Collection Time: 01/19/20  6:03 PM   Specimen: Stool  Result Value Ref Range Status   C Diff  antigen POSITIVE (A) NEGATIVE Final   C Diff toxin NEGATIVE NEGATIVE Final   C Diff interpretation Results are indeterminate. See PCR results.  Final    Comment: Performed at [facility], [place] [address] [place], [place] [address]  C. Diff by PCR, Reflexed     Status: Abnormal   Collection Time: 01/19/20  6:03 PM  Result Value Ref Range Status   Toxigenic C. Difficile by PCR POSITIVE (A) NEGATIVE Final    Comment: Positive for toxigenic C. difficile with little to no toxin production. Only treat if clinical presentation suggests symptomatic illness. Performed at [facility], [place] [address], [place], [place] [address]   MRSA PCR Screening     Status: None   Collection Time: 01/19/20  9:45 PM   Specimen: Nasopharyngeal  Result Value Ref Range Status   MRSA by PCR NEGATIVE NEGATIVE Final    Comment:        The GeneXpert MRSA Assay (FDA approved for NASAL specimens only), is one component of a comprehensive MRSA colonization surveillance program. It is not intended to diagnose MRSA infection nor to guide or monitor treatment for MRSA infections. Performed at [facility], [place] [address], [place], [place] [address]   Blood culture (routine x 2)     Status: None (Preliminary result)   Collection Time: 01/19/20 10:07 PM   Specimen: BLOOD LEFT ARM  Result Value Ref Range Status   Specimen Description   Final    BLOOD LEFT ARM Performed at [facility] [address], [place], [place] [address]    Special Requests   Final    BOTTLES DRAWN AEROBIC ONLY Blood Culture adequate volume Performed at [facility] [address], [place], [place] [address]    Culture   Final    NO GROWTH < 24 HOURS Performed at [facility] [address] [place], [place] [address]    Report Status PENDING  Incomplete    Michel Bickers, MD [facility] [place] [facility] [phone] pager   [phone] cell 01/20/2020, 3:08  PM

## 2020-01-20 NOTE — Progress Notes (Signed)
  Echocardiogram 2D Echocardiogram has been performed.  [name] 01/20/2020, 1:43 PM

## 2020-01-20 NOTE — Progress Notes (Addendum)
RN gave pt a sip of fluid & pt coughed some. HR then dropped to 20s for a few seconds & then came back up to the 80s. Pt also had a pause in her HR. Triad MD paged & admitting triad MD verbally notified.

## 2020-01-20 NOTE — Consult Note (Addendum)
Cardiology Consultation:   Patient ID: Krystal Mckenzie MRN: [id number]; DOB: 18-Feb-1927  Admit date: 01/19/2020 Date of Consult: 01/20/2020  Primary Care Provider: Patient, No Pcp Per Primary Cardiologist: New Primary Electrophysiologist:  None    Patient Profile:   Krystal Mckenzie is a 86 y.o. female with a hx of GERD, HTN, recent PE previously on Eliquis, hypothyroidism, CKD stage 3, chronic diastolic CHF, chronic pain, gout, hearing loss, and recurrent UTIs who is being seen today for the evaluation of sinus pause at the request of dr. Alfredia Ferguson.  History of Present Illness:   Krystal Mckenzie does not appear to have significant cardiac history. History was limited given patient't difficulty hearing and mostly was obtained through chart review. Appears she has a history of chronic diastolic heart failure  (no echo in chart review) for which she takes torsemide 10 mg five times a week. No known history of MI, stent, arrhythmia, stroke, DM, cancer. She also takes toprol-XL 25 mg daily, Isordil 20 mg, hydralazine 25 mg TID, and amlodipine 5mg  daily. She lives at [facility]. IT appears patient has had multiple admissions this year. One in January for covid infection and presumed PE so she was started on Eliquis. Admission in February for constipation and UTI.   The patient presented to the ED 01/19/2020 for dehydration and nausea.  In the ED blood pressure 107/46, pulse 82, temperature 95 F, respiratory rate 18%.  Labs showed WBC 18.4, creatinine 2.75, BUN 95, albumin 2.6, lipase 50, hemoglobin 12, platelets 303.  Abdominal x-ray showed possible ileus.  Chest x-ray showed small left pleural effusion and severe calcification of the thoracic aorta.  EKG with sinus and possible first degree AV block. She was started on IV fluids and given Zofran.  CT abdomen and pelvis showed possible gastroenteritis.  He had 3 episodes of diarrhea in the ER.  C. difficile came back positive.  Urine analysis positive for  UTI.  She was started on antibiotics.  NP came back at 155.  Lactic acid is normal along with procalcitonin.  Cultures pending.  Patient was admitted for further work-up. A pause was noted overnight and cardiology was consulted.   Past Medical History:  Diagnosis Date  . CHF (congestive heart failure) ([place])   . GERD (gastroesophageal reflux disease)   . Hypertension   . Hypothyroid   . Renal disorder    Stage 4    Past Surgical History:  Procedure Laterality Date  . JOINT REPLACEMENT     Left knee  . REPLACEMENT TOTAL KNEE       Home Medications:  Prior to Admission medications   Medication Sig Start Date End Date Taking? Authorizing Provider  acetaminophen (TYLENOL) 325 MG tablet Take 650 mg by mouth every 4 (four) hours as needed for fever.   Yes [provider]  albuterol (VENTOLIN HFA) 108 (90 Base) MCG/ACT inhaler Inhale 2 puffs into the lungs every 6 (six) hours as needed for wheezing or shortness of breath.   Yes [provider]  amLODipine (NORVASC) 5 MG tablet Take 5 mg by mouth daily. 09/15/19  Yes [provider]  apixaban (ELIQUIS) 5 MG TABS tablet Take 1 tablet (5 mg total) by mouth 2 (two) times daily. 09/28/19  Yes Gherghe, Vella Redhead, MD  AZO-CRANBERRY PO Take 1 tablet by mouth 2 (two) times daily. AZO cranberry + Probiotic 250mg -30mg -50 million cell   Yes [provider]  bisacodyl (DULCOLAX) 10 MG suppository Place 10 mg rectally every 12 (twelve)  hours as needed for moderate constipation. x3days started 2.1.21   Yes [provider]  Cholecalciferol (VITAMIN D3) 50 MCG (2000 UT) TABS Take 50 mcg by mouth daily.   Yes [provider]  clindamycin (CLEOCIN) 150 MG capsule Take 600 mg by mouth See admin instructions. 600 mg PO x1 one hour prior to all dental appointments   Yes [provider]  Febuxostat 80 MG TABS Take 80 mg by mouth daily. 09/15/19  Yes [provider]  hydrALAZINE (APRESOLINE) 25 MG  tablet Take 25 mg by mouth 3 (three) times daily.   Yes [provider]  HYDROcodone-acetaminophen (NORCO/VICODIN) 5-325 MG tablet Take 1 tablet by mouth 3 (three) times daily. 09/28/19  Yes Gherghe, Vella Redhead, MD  isosorbide dinitrate (ISORDIL) 20 MG tablet Take 40 mg by mouth 2 (two) times daily.   Yes [provider]  lactobacillus acidophilus (BACID) TABS tablet Take 2 tablets by mouth 2 (two) times daily.   Yes [provider]  levothyroxine (SYNTHROID, LEVOTHROID) 75 MCG tablet Take 75 mcg by mouth daily.     Yes [provider]  LORazepam (ATIVAN) 0.5 MG tablet Take 1 tablet (0.5 mg total) by mouth daily. Patient taking differently: Take 0.5 mg by mouth daily after lunch.  09/28/19  Yes Gherghe, Vella Redhead, MD  metoprolol succinate (TOPROL-XL) 25 MG 24 hr tablet Take 25 mg by mouth daily.   Yes [provider]  Multiple Vitamin (MULTIVITAMIN) tablet Take 1 tablet by mouth daily.   Yes [provider]  Multiple Vitamins-Minerals (DECUBI-VITE PO) Take 1 capsule by mouth daily.   Yes [provider]  neomycin-polymyxin b-dexamethasone (MAXITROL) 3.5-10000-0.1 OINT Place 1 application into the right eye at bedtime as needed (eye swelling, eye infection).   Yes [provider]  omeprazole (PRILOSEC) 20 MG capsule Take 20 mg by mouth daily. 08/17/19  Yes [provider]  ondansetron (ZOFRAN-ODT) 4 MG disintegrating tablet Take 4 mg by mouth every 8 (eight) hours as needed for nausea or vomiting.   Yes [provider]  phenylephrine-shark liver oil-mineral oil-petrolatum (PREPARATION H) 0.25-14-74.9 % rectal ointment Place 1 application rectally 4 (four) times daily as needed for hemorrhoids.   Yes [provider]  polyethylene glycol (MIRALAX / GLYCOLAX) 17 g packet Take 17 g by mouth daily. 10/08/19  Yes Alma Friendly, MD  potassium chloride (KLOR-CON) 10 MEQ tablet Take 10 mEq by mouth daily.   Yes  [provider]  senna-docusate (SENNA S) 8.6-50 MG tablet Take 1 tablet by mouth 2 (two) times daily.   Yes [provider]  torsemide (DEMADEX) 10 MG tablet Take 10 mg by mouth See admin instructions. 10 mg PO five times weekly (Tuesdays, Wednesdays, Fridays, Saturdays, and Sundays) 08/17/19  Yes [provider]  traZODone (DESYREL) 50 MG tablet Take 1 tablet (50 mg total) by mouth at bedtime. 09/28/19  Yes Gherghe, Vella Redhead, MD  vitamin B-12 1000 MCG tablet Take 1 tablet (1,000 mcg total) by mouth daily. 10/09/19  Yes Alma Friendly, MD  lactulose (CHRONULAC) 10 GM/15ML solution Take 20 g by mouth once.    [provider]    Inpatient Medications: Scheduled Meds: . acidophilus  2 capsule Oral BID  . Chlorhexidine Gluconate Cloth  6 each Topical Q0600  . febuxostat  80 mg Oral Daily  . heparin  5,000 Units Subcutaneous Q8H  . levothyroxine  75 mcg Oral Q0600  . LORazepam  0.5 mg Oral QPC lunch  .  pantoprazole  40 mg Oral Daily  . traZODone  50 mg Oral QHS  . vancomycin  125 mg Oral QID   Continuous Infusions: . meropenem (MERREM) IV Stopped (01/20/20 0601)  . metronidazole Stopped (01/20/20 0200)  . sodium bicarbonate 150 mEq in dextrose 5% 1000 mL     PRN Meds: acetaminophen **OR** acetaminophen, acetaminophen, ondansetron **OR** ondansetron (ZOFRAN) IV  Allergies:    Allergies  Allergen Reactions  . Morphine And Related   . Cephalexin Hives, Swelling and Rash  . Penicillins Swelling and Rash    Did it involve swelling of the face/tongue/throat, SOB, or low BP? Unknown Did it involve sudden or severe rash/hives, skin peeling, or any reaction on the inside of your mouth or nose? Unknown Did you need to seek medical attention at a hospital or doctor's office? Unknown When did it last happen? Unknown If all above answers are "NO", may proceed with cephalosporin use.     Social History:   Social History   Socioeconomic History  .  Marital status: Widowed    Spouse name: Not on file  . Number of children: Not on file  . Years of education: Not on file  . Highest education level: Not on file  Occupational History  . Not on file  Tobacco Use  . Smoking status: Never Smoker  . Smokeless tobacco: Never Used  Substance and Sexual Activity  . Alcohol use: Never  . Drug use: Never  . Sexual activity: Not on file  Other Topics Concern  . Not on file  Social History Narrative  . Not on file   Social Determinants of Health   Financial Resource Strain:   . Difficulty of Paying Living Expenses:   Food Insecurity:   . Worried About Charity fundraiser in the Last Year:   . Arboriculturist in the Last Year:   Transportation Needs:   . Film/video editor (Medical):   Marland Kitchen Lack of Transportation (Non-Medical):   Physical Activity:   . Days of Exercise per Week:   . Minutes of Exercise per Session:   Stress:   . Feeling of Stress :   Social Connections:   . Frequency of Communication with Friends and Family:   . Frequency of Social Gatherings with Friends and Family:   . Attends Religious Services:   . Active Member of Clubs or Organizations:   . Attends Archivist Meetings:   Marland Kitchen Marital Status:   Intimate Partner Violence:   . Fear of Current or Ex-Partner:   . Emotionally Abused:   Marland Kitchen Physically Abused:   . Sexually Abused:     Family History:   Unable to obtain due to difficulty hearing   ROS:  Please see the history of present illness.  All other ROS reviewed and negative.     Physical Exam/Data:   Vitals:   01/20/20 0400 01/20/20 0500 01/20/20 0600 01/20/20 0800  BP: (!) 125/48 (!) 109/39 (!) 116/54   Pulse: 79 92 (!) 110   Resp: 18 (!) 24 (!) 31   Temp: (!) 97.4 F (36.3 C)   97.7 F (36.5 C)  TempSrc: Axillary   Oral  SpO2: 96% 95% 94%   Weight:      Height:        Intake/Output Summary (Last 24 hours) at 01/20/2020 0827 Last data filed at 01/20/2020 0600 Gross per 24 hour    Intake 1799.04 ml  Output 450 ml  Net 1349.04 ml  Last 3 Weights 01/19/2020 10/07/2019 09/25/2019  Weight (lbs) 138 lb 0.1 oz 159 lb 6.3 oz 169 lb 1 oz  Weight (kg) 62.6 kg 72.3 kg 76.686 kg     Body mass index is 24.45 kg/m.  General:  Well nourished, well developed, in no acute distress HEENT: normal Lymph: no adenopathy Neck: + JVD Endocrine:  No thryomegaly Vascular: No carotid bruits; FA pulses 2+ bilaterally without bruits  Cardiac:  normal S1, S2; RRR; no murmur  Lungs:  clear to auscultation bilaterally, no wheezing, rhonchi or rales  Abd: soft, nontender, no hepatomegaly  Ext: no edema Musculoskeletal:  No deformities, BUE and BLE strength normal and equal Skin: warm and dry  Neuro:  CNs 2-12 intact, no focal abnormalities noted Psych:  Normal affect   EKG:  The EKG was personally reviewed and demonstrates:   sinus HR 79 bpm, possible first degree AV block, with nonspecific ST changes V5,V6, low voltage Telemetry:  Telemetry was personally reviewed and demonstrates:  Sinus with heart rates int he 80s, sinus pause last night 3.23 followed by 2.56 and then back to sinus with rates in the 60s. PVCs and PACs; possible second degree HB with elongating PRI.  Relevant CV Studies:  Order echo  Laboratory Data:  High Sensitivity Troponin:  No results for input(s): TROPONINIHS in the last 720 hours.   Chemistry Recent Labs  Lab 01/19/20 1454 01/20/20 0212  NA 137 141  K 4.3 3.8  CL 108 117*  CO2 17* 12*  GLUCOSE 86 65*  BUN 95* 83*  CREATININE 2.75* 2.29*  CALCIUM 9.1 8.6*  GFRNONAA 14* 18*  GFRAA 17* 21*  ANIONGAP 12 12    Recent Labs  Lab 01/19/20 1454 01/20/20 0212  PROT 6.6 5.5*  ALBUMIN 2.6* 2.2*  AST 17 18  ALT 13 11  ALKPHOS 97 82  BILITOT 0.4 0.4   Hematology Recent Labs  Lab 01/19/20 1454 01/20/20 0212  WBC 18.4* 19.5*  RBC 3.95 3.48*  HGB 12.2 10.9*  HCT 39.7 36.4  MCV 100.5* 104.6*  MCH 30.9 31.3  MCHC 30.7 29.9*  RDW 14.8 15.2  PLT  303 297   BNP Recent Labs  Lab 01/19/20 1454  BNP 155.9*    DDimer No results for input(s): DDIMER in the last 168 hours.   Radiology/Studies:  CT ABDOMEN PELVIS WO CONTRAST  Result Date: 01/19/2020 CLINICAL DATA:  Abdominal distension EXAM: CT ABDOMEN AND PELVIS WITHOUT CONTRAST TECHNIQUE: Multidetector CT imaging of the abdomen and pelvis was performed following the standard protocol without IV contrast. COMPARISON:  10/06/2019 FINDINGS: Lower chest: Bibasilar scarring. Cardiomegaly. No acute abnormality. Hepatobiliary: No focal hepatic abnormality. Gallbladder unremarkable. Pancreas: No focal abnormality or ductal dilatation. Spleen: No focal abnormality.  Normal size. Adrenals/Urinary Tract: Punctate nonobstructing bilateral renal stones. No obstruction. No suspicious renal or adrenal lesion on this noncontrast study. Urinary bladder unremarkable. Stomach/Bowel: Colon is fluid-filled with multiple air-fluid levels. Scattered sigmoid diverticula. Stomach and small bowel decompressed. Vascular/Lymphatic: Aortic atherosclerosis. No evidence of aneurysm or adenopathy. Reproductive: Uterus and adnexa unremarkable.  No mass. Other: No free fluid or free air. Musculoskeletal: No acute bony abnormality. Scoliosis and advanced degenerative changes throughout the lumbar spine. IMPRESSION: Fluid-filled colon with air-fluid levels. This is nonspecific. This could reflect gastroenteritis/diarrhea. Scattered sigmoid diverticulosis.  No active diverticulitis. Aortic atherosclerosis. Bilateral nephrolithiasis.  No ureteral stones or hydronephrosis. Electronically Signed   By: Rolm Baptise M.D.   On: 01/19/2020 17:11   DG Chest Portable 1 View  Result  Date: 01/19/2020 CLINICAL DATA:  Dehydration and constipation. EXAM: PORTABLE CHEST 1 VIEW COMPARISON:  October 06, 2019 FINDINGS: Mild, chronic appearing increased lung markings are seen. There is a small left pleural effusion. No pneumothorax is identified. The  heart size and mediastinal contours are within normal limits. There is marked severity calcification of the thoracic aorta. Degenerative changes seen throughout the thoracic spine with evidence of prior vertebroplasty noted at the approximate level of T11. IMPRESSION: Small left pleural effusion. Electronically Signed   By: Virgina Norfolk M.D.   On: 01/19/2020 16:28   Assessment and Plan:   Sepsis - Etiology UTI versus colitis.  Patient has history of UTIs as well as constipation. - Received IV fluids in the ED - Lactic acid and procalcitonin normal.  WBC elevated - Antibiotics per IM - ID consulted  Sinus pause/sinus bradycardia  - Longest pause was 3.23 seconds. Difficult to tell but appears might PRI is elongating with possible dropped QRS. Might be 2nd degree HB. It was very brief and patient has no memory of any symtpoms. Will review with MD.  - BB held on admission. Suspect the patient's last dose was yesterday morning given at SNF. Continue to hold BB.  - No further pauses noted on telemetry. MD to see  Chronic diastolic heart failure - No echo in chart review.  Will order - Appears patient was on torsemide at baseline.  This was held for AKI - BNP 155.  Chest x-ray with small left pleural effusion - She had 2L IVF int he ED  - Does not appear to be significantly fluid overloaded  AKI on CKD stage III -Creatinine 2.75 with BUN of 95 on admission -Baseline creatinine around 1.9 -Foley placed in the ED -Torsemide held -Creatinine today 2.29 with a BUN of 83  Hypertension -Home hydralazine, Isordil, Toprol held for soft blood pressures -Blood pressure this morning 116/54  Presumed PE -Patient completed 3 months of Eliquis on 01/11/2020  For questions or updates, please contact [facility] Please consult [email or web address] for contact info under   Signed, Cadence Ninfa Meeker, PA-C  01/20/2020 8:27 AM   The patient was seen, examined and discussed with Cadence Ninfa Meeker, PA-C    and I agree with the above.   86 y.o. female with a hx of GERD, HTN, recent PE previously on Eliquis, hypothyroidism, CKD stage 3, chronic diastolic CHF, chronic pain, gout, hearing loss, and recurrent UTIs who is being seen today for the evaluation of sinus pause, no cardiac history. The patient is a poor historian and very somnolent today difficult to arouse. She lives at [facility]. IT appears patient has had multiple admissions this year including COVID Pneumonia, recurrent UTIs.  She is currently admitted with C. difficile gastroenteritis, as well as UTI, symptomatic with nausea and dehydration and treated with antibiotics.    Urine analysis positive for UTI.  She was started on antibiotics.   She was noted to have a sinus pause lasting 3.2 seconds while asleep.  The patient was asymptomatic at the time.  She is minimally active.  This was one pause in the settings of acute sepsis.  Absolutely no intervention is required continue antibiotics and hydration.  Avoid AV nodal blocking agents.  Her echo shows completely normal age-appropriate echo with normal left and right systolic function grade 1 diastolic dysfunction and no significant valvular abnormalities.  [facility] will sign off.   Medication Recommendations: As above Other recommendations (labs, testing, etc): No further testing Follow  up as an outpatient: As needed  Ena Dawley, MD 01/20/2020

## 2020-01-20 NOTE — Progress Notes (Signed)
PROGRESS NOTE    Krystal Mckenzie  GLO:[id number] DOB: [birth year] DOA: 01/19/2020 PCP: Patient, No Pcp Per   Brief Narrative:  HPI per Dr. Mikki Harbor on 01/19/20  Krystal Mckenzie is a 86 y.o. female with HTN, hypothyroidism, GERD, DHF/LE swelling, CKD baseline Crt 1.6, chronic pain, gout and history of recurrent UTIs was sent from [facility] with concern for dehydration.  Apparently patient has been nauseous and facility got basic labs and KUB to evaluate.  Lab work indicated mild worsening of renal function with BUN of 95 and creatinine of 2.59 while KUB indicated possible ileus.  Patient extremely hard of hearing which is a major impediment to her history.  She reports history of nausea and loose stools but denies any vomiting.  Denies any abdominal pain.  Personal social history: Resident of [employer].  Reports is widowed with children.  Denies any smoking, drinking or illicit drug use.  **Interim History  Cardiology and infectious disease were consulted for further evaluation.  Cardiology ordered an echo and have signed off the case now.  Infectious disease recommending de-escalating antibiotics to just p.o. vancomycin  Assessment & Plan:   Principal Problem:   Sepsis ([place]) Active Problems:   Acute renal failure superimposed on chronic kidney disease ([place])   Recurrent UTI   Hypothyroid   Hypertension   Chronic diastolic CHF (congestive heart failure) (HCC)   History of pulmonary embolism   History of COVID-19   CKD (chronic kidney disease), stage III   Diarrhea   Macrocytic anemia   GERD (gastroesophageal reflux disease)   Bradycardia   Atherosclerotic peripheral vascular disease (HCC)  Sepsis, poA  -Question UTI  Vs colitis in the setting of C. difficile -Patient already received 2.5 L in the ED.  Will be careful with IV hydration given history of diastolic heart failure and currently on Sodium Bicarbonate at 75 mL/hr x1 Day  -She is positive for the  antigen as well as PCR positive but negative for toxin -Meropenem/Flagyl (meropenem does cover anaerobes) initiated in the ED however this will be stopped per ID recommendations -Infectious diseases recommending continue with oral vancomycin for concern for C. difficile -ID consult  this a.m. and Dr. Megan Salon favors continue vancomycin for now stopping meropenem and metronidazole and does not feel that the urine specimen has been to be very helpful given the does not appear to be a catheterized specimen.  He feels that even though her C. difficile toxin is negative she is certainly at risk for C. difficile given her recent hospitalization and antibiotic exposures -Patient's procalcitonin level was 0.87 and is now 0.20 -WBC is elevated and went from 18.4 -> 19.5 -Continue to Follow Cx's and Repeat CBC in the AM   AKI on CKD stage IIIa  Metabolic Acidosis  -BUN and creatinine on admission was 95/2.75 and after fluid hydration she is improved to 83/2.29 -Patient has a Metabolic Acidosis with a  CO2 of 12, Chloride 117, and AG of 12 -Foley to be placed in ED. -Holding torsemide, Lactulose. -Avoid nephrotoxic medications, contrast dyes, hypotension and renally adjust medications  -Continue to monitor and trend renal function repeat CMP in a.m.  Sick sinus syndrome/Sinus Bradycardia -While on stepdown, pt had a sinus pause >3sec. -c/t hold BB.  -Consulted Cardiology and she was asymptomatic and this happened when she was asleep and Cardiology feels that it iwa sin the setting of Sepsis -Cardiology Recommending Avoiding AV nodal blocking agents and no further intervention   Chronic Diastolic CHF -BNP  on admission was 155.9 -Echocardiogram was ordered by cardiology and is below -Chest x-ray did show small left pleural effusion -Currently her torsemide is being held and she is given IV fluids with sodium bicarbonate at 75 mL's per hour -Continue to monitor volume status carefully and she does  not appear to be significantly volume overloaded on examination.  Presumed PE -Pt completed 3 months of Eliquis on 01/11/2020.  HTN -Holding hydralazine, Imdur, Toprol-XL for borderline BPs -Also holding torsemide.  Hypothyroidism -C/w Levothyroxine 75 mcg po Daily   GERD -Initially continued her PPI however with concern for C. difficile will discontinue at this time  Hypoalbuminemia  -2.6  Gout -C/w Febuxostat 80 mg po Daily   DVT prophylaxis: Heparin 5,000 units sq q8h Code Status: DO NOT RESUSCITATE  Family Communication: No family present at bedside  Disposition Plan: (specify when and where you expect patient to be discharged). Include barriers to DC in this tab.   Consultants:   Infectious Diseases  Cardiology    Procedures:  ECHOCARDIOGRAM 1. Left ventricular ejection fraction, by estimation, is 60 to 65%. The  left ventricle has normal function. The left ventricle has no regional  wall motion abnormalities. There is mild left ventricular hypertrophy.  Left ventricular diastolic parameters  are consistent with age-related delayed relaxation (normal).  2. Right ventricular systolic function is normal. The right ventricular  size is normal. There is mildly elevated pulmonary artery systolic  pressure.  3. Nodular calcification of the anterior leaflet tip. The mitral valve is  degenerative. Trivial mitral valve regurgitation. No evidence of mitral  stenosis.  4. The aortic valve is tricuspid. Aortic valve regurgitation is not  visualized. Mild aortic valve stenosis.  5. The inferior vena cava is dilated in size with >50% respiratory  variability, suggesting right atrial pressure of 8 mmHg.   FINDINGS  Left Ventricle: Left ventricular ejection fraction, by estimation, is 60  to 65%. The left ventricle has normal function. The left ventricle has no  regional wall motion abnormalities. The left ventricular internal cavity  size was normal in size.  There is  mild left ventricular hypertrophy. Left ventricular diastolic parameters  are consistent with age-related delayed relaxation (normal).   Right Ventricle: The right ventricular size is normal. No increase in  right ventricular wall thickness. Right ventricular systolic function is  normal. There is mildly elevated pulmonary artery systolic pressure. The  tricuspid regurgitant velocity is 2.76  m/s, and with an assumed right atrial pressure of 8 mmHg, the estimated  right ventricular systolic pressure is 27.0 mmHg.   Left Atrium: Left atrial size was normal in size.   Right Atrium: Right atrial size was normal in size.   Pericardium: There is no evidence of pericardial effusion.   Mitral Valve: Nodular calcification of the anterior leaflet tip. The  mitral valve is degenerative in appearance. There is moderate thickening  of the mitral valve leaflet(s). There is moderate calcification of the  mitral valve leaflet(s). Normal mobility  of the mitral valve leaflets. Moderate mitral annular calcification.  Trivial mitral valve regurgitation. No evidence of mitral valve stenosis.  MV peak gradient, 9.9 mmHg. The mean mitral valve gradient is 3.0 mmHg.   Tricuspid Valve: The tricuspid valve is normal in structure. Tricuspid  valve regurgitation is trivial. No evidence of tricuspid stenosis.   Aortic Valve: The aortic valve is tricuspid. . There is moderate  thickening and moderate calcification of the aortic valve. Aortic valve  regurgitation is not visualized. Mild aortic  stenosis is present. There is  moderate thickening of the aortic valve.  There is moderate calcification of the aortic valve. Aortic valve mean  gradient measures 11.0 mmHg. Aortic valve peak gradient measures 22.2  mmHg. Aortic valve area, by VTI measures 1.23 cm.   Pulmonic Valve: The pulmonic valve was normal in structure. Pulmonic valve  regurgitation is not visualized. No evidence of pulmonic  stenosis.   Aorta: The aortic root is normal in size and structure.   Venous: The inferior vena cava is dilated in size with greater than 50%  respiratory variability, suggesting right atrial pressure of 8 mmHg.   IAS/Shunts: No atrial level shunt detected by color flow Doppler.     LEFT VENTRICLE  PLAX 2D  LVIDd:     3.54 cm   Diastology  LVIDs:     1.95 cm   LV e' lateral:  7.02 cm/s  LV PW:     1.42 cm   LV E/e' lateral: 13.8  LV IVS:    1.16 cm   LV e' medial:  4.60 cm/s  LVOT diam:   1.70 cm   LV E/e' medial: 21.0  LV SV:     48  LV SV Index:  29  LVOT Area:   2.27 cm    LV Volumes (MOD)  LV vol d, MOD A2C: 76.7 ml  LV vol d, MOD A4C: 52.5 ml  LV vol s, MOD A2C: 22.4 ml  LV vol s, MOD A4C: 18.4 ml  LV SV MOD A2C:   54.3 ml  LV SV MOD A4C:   52.5 ml  LV SV MOD BP:   41.8 ml   RIGHT VENTRICLE       IVC  RV S prime:   20.80 cm/s IVC diam: 2.22 cm  TAPSE (M-mode): 2.3 cm   LEFT ATRIUM      Index    RIGHT ATRIUM      Index  LA diam:   3.45 cm 2.09 cm/m RA Area:   12.60 cm  LA Vol (A2C): 33.9 ml 20.53 ml/m RA Volume:  31.10 ml 18.83 ml/m  LA Vol (A4C): 36.1 ml 21.86 ml/m  AORTIC VALVE  AV Area (Vmax):  1.20 cm  AV Area (Vmean):  1.30 cm  AV Area (VTI):   1.23 cm  AV Vmax:      235.50 cm/s  AV Vmean:     152.500 cm/s  AV VTI:      0.392 m  AV Peak Grad:   22.2 mmHg  AV Mean Grad:   11.0 mmHg  LVOT Vmax:     125.00 cm/s  LVOT Vmean:    87.500 cm/s  LVOT VTI:     0.212 m  LVOT/AV VTI ratio: 0.54    AORTA  Ao Root diam: 2.90 cm   MITRAL VALVE        TRICUSPID VALVE  MV Area (PHT): 5.02 cm   TR Peak grad:  30.5 mmHg  MV Peak grad: 9.9 mmHg   TR Vmax:    276.00 cm/s  MV Mean grad: 3.0 mmHg  MV Vmax:    1.57 m/s   SHUNTS  MV Vmean:   69.7 cm/s  Systemic VTI: 0.21 m  MV Decel Time: 151 msec   Systemic  Diam: 1.70 cm  MV E velocity: 96.70 cm/s  MV A velocity: 158.00 cm/s  MV E/A ratio: 0.61   Antimicrobials:  Anti-infectives (From admission, onward)   Start     [employer]  Frequency Ordered Stop   01/20/20 1000  vancomycin (VANCOCIN) 50 mg/mL oral solution 125 mg     125 mg Oral 4 times daily 01/20/20 0741 01/30/20 0959   01/20/20 0600  meropenem (MERREM) 500 mg in sodium chloride 0.9 % 100 mL IVPB  Status:  Discontinued     500 mg 200 mL/hr over 30 Minutes Intravenous Every 12 hours 01/19/20 2104 01/20/20 1519   01/20/20 0200  meropenem (MERREM) 1 g in sodium chloride 0.9 % 100 mL IVPB  Status:  Discontinued     1 g 200 mL/hr over 30 Minutes Intravenous Every 8 hours 01/19/20 2002 01/19/20 2104   01/20/20 0200  metroNIDAZOLE (FLAGYL) IVPB 500 mg  Status:  Discontinued     500 mg 100 mL/hr over 60 Minutes Intravenous Every 8 hours 01/19/20 2002 01/20/20 1519   01/19/20 1800  metroNIDAZOLE (FLAGYL) IVPB 500 mg     500 mg 100 mL/hr over 60 Minutes Intravenous  Once 01/19/20 1753 01/19/20 1921   01/19/20 1700  meropenem (MERREM) 1 g in sodium chloride 0.9 % 100 mL IVPB     1 g 200 mL/hr over 30 Minutes Intravenous  Once 01/19/20 1634 01/19/20 1808     Subjective: Seen and examined at bedside and she is extremely hard of hearing.  She complains of leg pain when palpated.  No nausea or vomiting.  Has been having some diarrheal episodes but has not had some since coming to the floor.  No chest pain, lightheadedness or dizziness.  No other concerns or complaints at this time.  Objective: Vitals:   01/20/20 1500 01/20/20 1600 01/20/20 1700 01/20/20 1800  BP: (!) 112/41 (!) 114/41 (!) 121/40 (!) 117/36  Pulse: 98 92 (!) 107 89  Resp: 16 (!) 24 (!) 23 (!) 21  Temp:  98.1 F (36.7 C)    TempSrc:  Oral    SpO2: 96% 94% 95% 96%  Weight:      Height:        Intake/Output Summary (Last 24 hours) at 01/20/2020 1835 Last data filed at 01/20/2020 1700 Gross per 24 hour  Intake  1759.04 ml  Output 800 ml  Net 959.04 ml   Filed Weights   01/19/20 2139  Weight: 62.6 kg   Examination: Physical Exam:  Constitutional: Thin elderly Caucasian female currently in NAD and appears calm but somewhat uncomfortable Eyes: Lds and conjunctivae normal, sclerae anicteric  ENMT: External Ears, Nose appear normal.  Extremely hard of hearing.  Neck: Appears normal, supple, no cervical masses, normal ROM, no appreciable thyromegaly; no JVD Respiratory: Diminished to auscultation bilaterally, no wheezing, rales, rhonchi or crackles. Normal respiratory effort and patient is not tachypenic. No accessory muscle use.  Unlabored breathing Cardiovascular: RRR, has a 2 out of 6 systolic murmur. S1 and S2 auscultated.  Trace extremity edema Abdomen: Soft, non-tender, non-distended. Bowel sounds positive.  GU: Deferred. Musculoskeletal: No clubbing / cyanosis of digits/nails. No joint deformity upper and lower extremities.  Painful extremities to palpation Skin: No rashes, lesions, ulcers on limited skin evaluation. No induration; Warm and dry.  Neurologic: CN 2-12 grossly intact with no focal deficits.  Romberg sign cerebellar reflexes not assessed.  Psychiatric: Normal judgment and insight. Alert and oriented x 3. Normal mood and appropriate affect.   Data Reviewed: I have personally reviewed following labs and imaging studies  CBC: Recent Labs  Lab 01/19/20 1454 01/20/20 0212  WBC 18.4* 19.5*  NEUTROABS 15.2* 17.0*  HGB 12.2 10.9*  HCT 39.7 36.4  MCV 100.5* 104.6*  PLT 303 244   Basic Metabolic Panel: Recent Labs  Lab 01/19/20 1454 01/20/20 0212  NA 137 141  K 4.3 3.8  CL 108 117*  CO2 17* 12*  GLUCOSE 86 65*  BUN 95* 83*  CREATININE 2.75* 2.29*  CALCIUM 9.1 8.6*   GFR: Estimated Creatinine Clearance: 13.2 mL/min (A) (by C-G formula based on SCr of 2.29 mg/dL (H)). Liver Function Tests: Recent Labs  Lab 01/19/20 1454 01/20/20 0212  AST 17 18  ALT 13 11    ALKPHOS 97 82  BILITOT 0.4 0.4  PROT 6.6 5.5*  ALBUMIN 2.6* 2.2*   Recent Labs  Lab 01/19/20 1454  LIPASE 50   No results for input(s): AMMONIA in the last 168 hours. Coagulation Profile: No results for input(s): INR, PROTIME in the last 168 hours. Cardiac Enzymes: No results for input(s): CKTOTAL, CKMB, CKMBINDEX, TROPONINI in the last 168 hours. BNP (last 3 results) No results for input(s): PROBNP in the last 8760 hours. HbA1C: No results for input(s): HGBA1C in the last 72 hours. CBG: No results for input(s): GLUCAP in the last 168 hours. Lipid Profile: No results for input(s): CHOL, HDL, LDLCALC, TRIG, CHOLHDL, LDLDIRECT in the last 72 hours. Thyroid Function Tests: No results for input(s): TSH, T4TOTAL, FREET4, T3FREE, THYROIDAB in the last 72 hours. Anemia Panel: No results for input(s): VITAMINB12, FOLATE, FERRITIN, TIBC, IRON, RETICCTPCT in the last 72 hours. Sepsis Labs: Recent Labs  Lab 01/19/20 1454 01/19/20 1608 01/19/20 1825 01/20/20 0212  PROCALCITON 0.87  --   --  0.20  LATICACIDVEN  --  0.8 0.6  --     Recent Results (from the past 240 hour(s))  Blood culture (routine x 2)     Status: None (Preliminary result)   Collection Time: 01/19/20  4:08 PM   Specimen: BLOOD LEFT FOREARM  Result Value Ref Range Status   Specimen Description   Final    BLOOD LEFT FOREARM Performed at [facility] [address] [place], [place] [address]    Special Requests   Final    BOTTLES DRAWN AEROBIC AND ANAEROBIC Blood Culture adequate volume Performed at [facility] [address], [place], [place] [address]    Culture   Final    NO GROWTH < 24 HOURS Performed at [facility] [address], [place], [place] [address]    Report Status PENDING  Incomplete  SARS Coronavirus 2 by RT PCR (hospital order, performed in [facility] hospital lab) Nasopharyngeal Nasopharyngeal Swab     Status: None   Collection Time:  01/19/20  4:23 PM   Specimen: Nasopharyngeal Swab  Result Value Ref Range Status   SARS Coronavirus 2 NEGATIVE NEGATIVE Final    Comment: (NOTE) SARS-CoV-2 target nucleic acids are NOT DETECTED. The SARS-CoV-2 RNA is generally detectable in upper and lower respiratory specimens during the acute phase of infection. The lowest concentration of SARS-CoV-2 viral copies this assay can detect is 250 copies / mL. A negative result does not preclude SARS-CoV-2 infection and should not be used as the sole basis for treatment or other patient management decisions.  A negative result may occur with improper specimen collection / handling, submission of specimen other than nasopharyngeal swab, presence of viral mutation(s) within the areas targeted by this assay, and inadequate number of viral copies (<250 copies / mL). A negative result must be combined with clinical observations, patient history, and epidemiological information. Fact Sheet for Patients:  StrictlyIdeas.no Fact Sheet for Healthcare Providers: BankingDealers.co.za This test is not yet approved or cleared  by the [place] FDA and has been authorized for detection and/or diagnosis of SARS-CoV-2 by FDA under an Emergency Use Authorization (EUA).  This EUA will remain in effect (meaning this test can be used) for the duration of the COVID-19 declaration under Section 564(b)(1) of the Act, 21 U.S.C. section 360bbb-3(b)(1), unless the authorization is terminated or revoked sooner. Performed at [facility], [place] [address] [place], [place] [address]   C Difficile Quick Screen w PCR reflex     Status: Abnormal   Collection Time: 01/19/20  6:03 PM   Specimen: Stool  Result Value Ref Range Status   C Diff antigen POSITIVE (A) NEGATIVE Final   C Diff toxin NEGATIVE NEGATIVE Final   C Diff interpretation Results are indeterminate. See PCR results.  Final    Comment:  Performed at [facility], [place] [address] [place], [place] [address]  C. Diff by PCR, Reflexed     Status: Abnormal   Collection Time: 01/19/20  6:03 PM  Result Value Ref Range Status   Toxigenic C. Difficile by PCR POSITIVE (A) NEGATIVE Final    Comment: Positive for toxigenic C. difficile with little to no toxin production. Only treat if clinical presentation suggests symptomatic illness. Performed at [facility], [place] [address], [place], [place] [address]   MRSA PCR Screening     Status: None   Collection Time: 01/19/20  9:45 PM   Specimen: Nasopharyngeal  Result Value Ref Range Status   MRSA by PCR NEGATIVE NEGATIVE Final    Comment:        The GeneXpert MRSA Assay (FDA approved for NASAL specimens only), is one component of a comprehensive MRSA colonization surveillance program. It is not intended to diagnose MRSA infection nor to guide or monitor treatment for MRSA infections. Performed at [facility], [place] [address], [place], [place] [address]   Blood culture (routine x 2)     Status: None (Preliminary result)   Collection Time: 01/19/20 10:07 PM   Specimen: BLOOD LEFT ARM  Result Value Ref Range Status   Specimen Description   Final    BLOOD LEFT ARM Performed at [facility] [address], [place], [place] [address]    Special Requests   Final    BOTTLES DRAWN AEROBIC ONLY Blood Culture adequate volume Performed at [facility] [address] [place], [place] [address]    Culture   Final    NO GROWTH < 24 HOURS Performed at [facility] [address] [place], [place] [address]    Report Status PENDING  Incomplete     RN Pressure Injury Documentation: Pressure Injury 09/21/19 Buttocks Left Stage 2 -  Partial thickness loss of dermis presenting as a shallow open injury with a red, pink wound bed without slough. (Active)  09/21/19 1600  Location: Buttocks  Location  Orientation: Left  Staging: Stage 2 -  Partial thickness loss of dermis presenting as a shallow open injury with a red, pink wound bed without slough.  Wound Description (Comments):   Present on Admission: Yes     Pressure Injury 09/21/19 Buttocks Right Stage 1 -  Intact skin with non-blanchable redness of a localized area usually over a bony prominence. (Active)  09/21/19 1600  Location: Buttocks  Location Orientation: Right  Staging: Stage 1 -  Intact skin with  non-blanchable redness of a localized area usually over a bony prominence.  Wound Description (Comments):   Present on Admission: Yes     Estimated body mass index is 24.45 kg/m as calculated from the following:   Height as of this encounter: 5\' 3"  (1.6 m).   Weight as of this encounter: 62.6 kg.  Malnutrition Type:      Malnutrition Characteristics:      Nutrition Interventions:    Radiology Studies: CT ABDOMEN PELVIS WO CONTRAST  Result Date: 01/19/2020 CLINICAL DATA:  Abdominal distension EXAM: CT ABDOMEN AND PELVIS WITHOUT CONTRAST TECHNIQUE: Multidetector CT imaging of the abdomen and pelvis was performed following the standard protocol without IV contrast. COMPARISON:  10/06/2019 FINDINGS: Lower chest: Bibasilar scarring. Cardiomegaly. No acute abnormality. Hepatobiliary: No focal hepatic abnormality. Gallbladder unremarkable. Pancreas: No focal abnormality or ductal dilatation. Spleen: No focal abnormality.  Normal size. Adrenals/Urinary Tract: Punctate nonobstructing bilateral renal stones. No obstruction. No suspicious renal or adrenal lesion on this noncontrast study. Urinary bladder unremarkable. Stomach/Bowel: Colon is fluid-filled with multiple air-fluid levels. Scattered sigmoid diverticula. Stomach and small bowel decompressed. Vascular/Lymphatic: Aortic atherosclerosis. No evidence of aneurysm or adenopathy. Reproductive: Uterus and adnexa unremarkable.  No mass. Other: No free fluid or free air.  Musculoskeletal: No acute bony abnormality. Scoliosis and advanced degenerative changes throughout the lumbar spine. IMPRESSION: Fluid-filled colon with air-fluid levels. This is nonspecific. This could reflect gastroenteritis/diarrhea. Scattered sigmoid diverticulosis.  No active diverticulitis. Aortic atherosclerosis. Bilateral nephrolithiasis.  No ureteral stones or hydronephrosis. Electronically Signed   By: Rolm Baptise M.D.   On: 01/19/2020 17:11   DG Chest Portable 1 View  Result Date: 01/19/2020 CLINICAL DATA:  Dehydration and constipation. EXAM: PORTABLE CHEST 1 VIEW COMPARISON:  October 06, 2019 FINDINGS: Mild, chronic appearing increased lung markings are seen. There is a small left pleural effusion. No pneumothorax is identified. The heart size and mediastinal contours are within normal limits. There is marked severity calcification of the thoracic aorta. Degenerative changes seen throughout the thoracic spine with evidence of prior vertebroplasty noted at the approximate level of T11. IMPRESSION: Small left pleural effusion. Electronically Signed   By: Virgina Norfolk M.D.   On: 01/19/2020 16:28   ECHOCARDIOGRAM COMPLETE  Result Date: 01/20/2020    ECHOCARDIOGRAM REPORT   Patient Name:   Krystal Mckenzie Date of Exam: 01/20/2020 Medical Rec #:  [id number]      Height:       63.0 in Accession #:    [id number]     Weight:       138.0 lb Date of Birth:  10/28/1926      BSA:          1.652 m Patient Age:    66 years       BP:           111/42 mmHg Patient Gender: F              HR:           94 bpm. Exam Location:  Inpatient Procedure: 2D Echo, Cardiac Doppler and Color Doppler Indications:    I50.20* Unspecified systolic (congestive) heart failure  History:        Patient has no prior history of Echocardiogram examinations.                 CHF, Abnormal ECG, Signs/Symptoms:Bacteremia; Risk                 Factors:Hypertension. History of pulmonary embolus.  Sonographer:  Roseanna Rainbow RDCS Referring  Phys: [id number] [place]  Sonographer Comments: Technically difficult study due to poor echo windows. Image acquisition challenging due to uncooperative patient. Patient uncomfortable during exam. Patient was very sensitive to pressure form probe. IMPRESSIONS  1. Left ventricular ejection fraction, by estimation, is 60 to 65%. The left ventricle has normal function. The left ventricle has no regional wall motion abnormalities. There is mild left ventricular hypertrophy. Left ventricular diastolic parameters are consistent with age-related delayed relaxation (normal).  2. Right ventricular systolic function is normal. The right ventricular size is normal. There is mildly elevated pulmonary artery systolic pressure.  3. Nodular calcification of the anterior leaflet tip. The mitral valve is degenerative. Trivial mitral valve regurgitation. No evidence of mitral stenosis.  4. The aortic valve is tricuspid. Aortic valve regurgitation is not visualized. Mild aortic valve stenosis.  5. The inferior vena cava is dilated in size with >50% respiratory variability, suggesting right atrial pressure of 8 mmHg. FINDINGS  Left Ventricle: Left ventricular ejection fraction, by estimation, is 60 to 65%. The left ventricle has normal function. The left ventricle has no regional wall motion abnormalities. The left ventricular internal cavity size was normal in size. There is  mild left ventricular hypertrophy. Left ventricular diastolic parameters are consistent with age-related delayed relaxation (normal). Right Ventricle: The right ventricular size is normal. No increase in right ventricular wall thickness. Right ventricular systolic function is normal. There is mildly elevated pulmonary artery systolic pressure. The tricuspid regurgitant velocity is 2.76  m/s, and with an assumed right atrial pressure of 8 mmHg, the estimated right ventricular systolic pressure is 70.1 mmHg. Left Atrium: Left atrial size was normal in size.  Right Atrium: Right atrial size was normal in size. Pericardium: There is no evidence of pericardial effusion. Mitral Valve: Nodular calcification of the anterior leaflet tip. The mitral valve is degenerative in appearance. There is moderate thickening of the mitral valve leaflet(s). There is moderate calcification of the mitral valve leaflet(s). Normal mobility of the mitral valve leaflets. Moderate mitral annular calcification. Trivial mitral valve regurgitation. No evidence of mitral valve stenosis. MV peak gradient, 9.9 mmHg. The mean mitral valve gradient is 3.0 mmHg. Tricuspid Valve: The tricuspid valve is normal in structure. Tricuspid valve regurgitation is trivial. No evidence of tricuspid stenosis. Aortic Valve: The aortic valve is tricuspid. . There is moderate thickening and moderate calcification of the aortic valve. Aortic valve regurgitation is not visualized. Mild aortic stenosis is present. There is moderate thickening of the aortic valve. There is moderate calcification of the aortic valve. Aortic valve mean gradient measures 11.0 mmHg. Aortic valve peak gradient measures 22.2 mmHg. Aortic valve area, by VTI measures 1.23 cm. Pulmonic Valve: The pulmonic valve was normal in structure. Pulmonic valve regurgitation is not visualized. No evidence of pulmonic stenosis. Aorta: The aortic root is normal in size and structure. Venous: The inferior vena cava is dilated in size with greater than 50% respiratory variability, suggesting right atrial pressure of 8 mmHg. IAS/Shunts: No atrial level shunt detected by color flow Doppler.  LEFT VENTRICLE PLAX 2D LVIDd:         3.54 cm     Diastology LVIDs:         1.95 cm     LV e' lateral:   7.02 cm/s LV PW:         1.42 cm     LV E/e' lateral: 13.8 LV IVS:        1.16 cm  LV e' medial:    4.60 cm/s LVOT diam:     1.70 cm     LV E/e' medial:  21.0 LV SV:         48 LV SV Index:   29 LVOT Area:     2.27 cm  LV Volumes (MOD) LV vol d, MOD A2C: 76.7 ml LV vol  d, MOD A4C: 52.5 ml LV vol s, MOD A2C: 22.4 ml LV vol s, MOD A4C: 18.4 ml LV SV MOD A2C:     54.3 ml LV SV MOD A4C:     52.5 ml LV SV MOD BP:      41.8 ml RIGHT VENTRICLE             IVC RV S prime:     20.80 cm/s  IVC diam: 2.22 cm TAPSE (M-mode): 2.3 cm LEFT ATRIUM           Index       RIGHT ATRIUM           Index LA diam:      3.45 cm 2.09 cm/m  RA Area:     12.60 cm LA Vol (A2C): 33.9 ml 20.53 ml/m RA Volume:   31.10 ml  18.83 ml/m LA Vol (A4C): 36.1 ml 21.86 ml/m  AORTIC VALVE AV Area (Vmax):    1.20 cm AV Area (Vmean):   1.30 cm AV Area (VTI):     1.23 cm AV Vmax:           235.50 cm/s AV Vmean:          152.500 cm/s AV VTI:            0.392 m AV Peak Grad:      22.2 mmHg AV Mean Grad:      11.0 mmHg LVOT Vmax:         125.00 cm/s LVOT Vmean:        87.500 cm/s LVOT VTI:          0.212 m LVOT/AV VTI ratio: 0.54  AORTA Ao Root diam: 2.90 cm MITRAL VALVE                TRICUSPID VALVE MV Area (PHT): 5.02 cm     TR Peak grad:   30.5 mmHg MV Peak grad:  9.9 mmHg     TR Vmax:        276.00 cm/s MV Mean grad:  3.0 mmHg MV Vmax:       1.57 m/s     SHUNTS MV Vmean:      69.7 cm/s    Systemic VTI:  0.21 m MV Decel Time: 151 msec     Systemic Diam: 1.70 cm MV E velocity: 96.70 cm/s MV A velocity: 158.00 cm/s MV E/A ratio:  0.61 Jenkins Rouge MD Electronically signed by Jenkins Rouge MD Signature Date/Time: 01/20/2020/1:53:13 PM    Final    Scheduled Meds: . acidophilus  2 capsule Oral BID  . Chlorhexidine Gluconate Cloth  6 each Topical Q0600  . febuxostat  80 mg Oral Daily  . heparin  5,000 Units Subcutaneous Q8H  . levothyroxine  75 mcg Oral Q0600  . LORazepam  0.5 mg Oral QPC lunch  . pantoprazole  40 mg Oral Daily  . traZODone  50 mg Oral QHS  . vancomycin  125 mg Oral QID   Continuous Infusions: . sodium bicarbonate 150 mEq in dextrose 5% 1000 mL 75 mL/hr at 01/20/20 1232    LOS: 1  day   Kerney Elbe, DO [facility] PAGER is on AMION  If 7PM-7AM, please contact  night-coverage [email or web address]

## 2020-01-20 NOTE — Evaluation (Addendum)
Clinical/Bedside Swallow Evaluation Patient Details  Name: Krystal Mckenzie MRN: [id number] Date of Birth: 09/03/1927  Today's Date: 01/20/2020 Time: SLP Start Time (ACUTE ONLY): 42 SLP Stop Time (ACUTE ONLY): 1030 SLP Time Calculation (min) (ACUTE ONLY): 25 min  Past Medical History:  Past Medical History:  Diagnosis Date  . CHF (congestive heart failure) ([place])   . GERD (gastroesophageal reflux disease)   . Hypertension   . Hypothyroid   . Renal disorder    Stage 4   Past Surgical History:  Past Surgical History:  Procedure Laterality Date  . JOINT REPLACEMENT     Left knee  . REPLACEMENT TOTAL KNEE     HPI:  86yo female admitted 01/19/20 from facility with dehydration. PMH: HTN, hypothyroid, GERD, DHF/LE swelling, CKD, chronic pain, gout, recurrent UTI, Extremely HOH. CXR = small left pleural effusion   Assessment / Plan / Recommendation Clinical Impression  Pt presents with adequate natural dentition and functional oral motor strength. She reports no difficulty swallowing. Pt accepted trials of thin liquid, puree, and solid texture, and did not exhibit overt s/s aspiration on any consistency given. Extended oral prep of graham cracker. RN present, and provided whole meds with thin liquid - no difficulty observed or reported. Recommend advancing diet to regular per GI recommendations (possible ileus), to allow pt full range of choices to facilitate adequate po intake. Thin liquids, meds whole with liquid. Safe swallow precautions given to RN to post at [facility]. No further ST intervention recommended at this time. Please reconsult if needs arise.   SLP Visit Diagnosis: Dysphagia, unspecified (R13.10)    Aspiration Risk  Mild aspiration risk    Diet Recommendation Regular;Thin liquid   Liquid Administration via: Cup;Straw Medication Administration: Whole meds with liquid Supervision: Staff to assist with self feeding;Full supervision/cueing for compensatory  strategies Compensations: Minimize environmental distractions;Small sips/bites;Slow rate Postural Changes: Seated upright at 90 degrees;Remain upright for at least 30 minutes after po intake    Other  Recommendations Oral Care Recommendations: Oral care BID   Follow up Recommendations None          Prognosis Prognosis for Safe Diet Advancement: Good      Swallow Study   General Date of Onset: 01/19/20 HPI: 86yo female admitted 01/19/20 from facility with dehydration. PMH: HTN, hypothyroid, GERD, DHF/LE swelling, CKD, chronic pain, gout, recurrent UTI, Extremely HOH. CXR = small left pleural effusion Type of Study: Bedside Swallow Evaluation Previous Swallow Assessment: none Diet Prior to this Study: Other (Comment)(full liquid) Temperature Spikes Noted: No Respiratory Status: Room air History of Recent Intubation: No Behavior/Cognition: Alert;Cooperative;Pleasant mood Oral Cavity Assessment: Within Functional Limits Oral Care Completed by SLP: No Oral Cavity - Dentition: Adequate natural dentition Vision: Functional for self-feeding Self-Feeding Abilities: Needs assist;Needs set up Patient Positioning: Upright in bed Baseline Vocal Quality: Normal Volitional Cough: Weak Volitional Swallow: Able to elicit    Oral/Motor/Sensory Function Overall Oral Motor/Sensory Function: Within functional limits   Ice Chips Ice chips: Within functional limits Presentation: Spoon   Thin Liquid Thin Liquid: Within functional limits Presentation: Straw    Nectar Thick Nectar Thick Liquid: Not tested   Honey Thick Honey Thick Liquid: Not tested   Puree Puree: Within functional limits Presentation: Spoon   Solid     Solid: Impaired Oral Phase Functional Implications: Prolonged oral transit      Bronte Kropf B. Quentin Ore, [facility], [place] Speech Language Pathologist Office: [phone] Pager: [phone]  Shonna Chock 01/20/2020,10:36 AM

## 2020-01-21 ENCOUNTER — Inpatient Hospital Stay (HOSPITAL_COMMUNITY): Payer: Medicare Other

## 2020-01-21 LAB — BLOOD CULTURE ID PANEL (REFLEXED)

## 2020-01-21 LAB — CBC WITH DIFFERENTIAL/PLATELET
Abs Immature Granulocytes: 0.06 10*3/uL (ref 0.00–0.07)
Basophils Absolute: 0 10*3/uL (ref 0.0–0.1)
Basophils Relative: 0 %
Eosinophils Absolute: 0.1 10*3/uL (ref 0.0–0.5)
Eosinophils Relative: 1 %
HCT: 31.8 % — ABNORMAL LOW (ref 36.0–46.0)
Hemoglobin: 10.1 g/dL — ABNORMAL LOW (ref 12.0–15.0)
Immature Granulocytes: 1 %
Lymphocytes Relative: 12 %
Lymphs Abs: 1.5 10*3/uL (ref 0.7–4.0)
MCH: 31.2 pg (ref 26.0–34.0)
MCHC: 31.8 g/dL (ref 30.0–36.0)
MCV: 98.1 fL (ref 80.0–100.0)
Monocytes Absolute: 1.1 10*3/uL — ABNORMAL HIGH (ref 0.1–1.0)
Monocytes Relative: 9 %
Neutro Abs: 9.9 10*3/uL — ABNORMAL HIGH (ref 1.7–7.7)
Neutrophils Relative %: 77 %
Platelets: 290 10*3/uL (ref 150–400)
RBC: 3.24 MIL/uL — ABNORMAL LOW (ref 3.87–5.11)
RDW: 14.9 % (ref 11.5–15.5)
WBC: 12.7 10*3/uL — ABNORMAL HIGH (ref 4.0–10.5)
nRBC: 0 % (ref 0.0–0.2)

## 2020-01-21 LAB — PHOSPHORUS: Phosphorus: 3.3 mg/dL (ref 2.5–4.6)

## 2020-01-21 LAB — COMPREHENSIVE METABOLIC PANEL
ALT: 10 U/L (ref 0–44)
AST: 15 U/L (ref 15–41)
Albumin: 2.1 g/dL — ABNORMAL LOW (ref 3.5–5.0)
Alkaline Phosphatase: 74 U/L (ref 38–126)
Anion gap: 11 (ref 5–15)
BUN: 70 mg/dL — ABNORMAL HIGH (ref 8–23)
CO2: 21 mmol/L — ABNORMAL LOW (ref 22–32)
Calcium: 8.3 mg/dL — ABNORMAL LOW (ref 8.9–10.3)
Chloride: 109 mmol/L (ref 98–111)
Creatinine, Ser: 2.03 mg/dL — ABNORMAL HIGH (ref 0.44–1.00)
GFR calc Af Amer: 24 mL/min — ABNORMAL LOW (ref >60)
GFR calc non Af Amer: 21 mL/min — ABNORMAL LOW (ref >60)
Glucose, Bld: 111 mg/dL — ABNORMAL HIGH (ref 70–99)
Potassium: 3 mmol/L — ABNORMAL LOW (ref 3.5–5.1)
Sodium: 141 mmol/L (ref 135–145)
Total Bilirubin: 0.3 mg/dL (ref 0.3–1.2)
Total Protein: 5.2 g/dL — ABNORMAL LOW (ref 6.5–8.1)

## 2020-01-21 LAB — MAGNESIUM: Magnesium: 1.7 mg/dL (ref 1.7–2.4)

## 2020-01-21 LAB — PROCALCITONIN: Procalcitonin: 0.22 ng/mL

## 2020-01-21 MED ORDER — MAGNESIUM SULFATE 2 GM/50ML IV SOLN
2.0000 g | Freq: Once | INTRAVENOUS | Status: AC
  Administered 2020-01-21: 2 g via INTRAVENOUS
  Filled 2020-01-21: qty 50

## 2020-01-21 MED ORDER — POTASSIUM CHLORIDE CRYS ER 20 MEQ PO TBCR
40.0000 meq | EXTENDED_RELEASE_TABLET | Freq: Two times a day (BID) | ORAL | Status: AC
  Administered 2020-01-21 (×2): 40 meq via ORAL
  Filled 2020-01-21 (×2): qty 2

## 2020-01-21 MED ORDER — HYDROCODONE-ACETAMINOPHEN 5-325 MG PO TABS
1.0000 | ORAL_TABLET | Freq: Three times a day (TID) | ORAL | Status: DC
  Administered 2020-01-21 – 2020-01-27 (×19): 1 via ORAL
  Filled 2020-01-21 (×19): qty 1

## 2020-01-21 NOTE — Progress Notes (Signed)
Patient ID: Krystal Mckenzie, female   DOB: [birth year], 86 y.o.   MRN: [id number]         [facility]  Date of Admission:  01/19/2020           Day 2 oral vancomycin ASSESSMENT: She is clinically improved.  She is now normothermic much more alert.  He denies any recent symptoms suggesting recurrent UTI.  Her urine specimen was not a catheterized specimen and it is contaminated.  1 of 2 blood cultures is growing unidentified cocci that may be contaminant.  I do not recommend restarting IV vancomycin.  Although her C. difficile toxin screen was negative her recent history, clinical presentation and CT findings are all compatible with C. difficile colitis.  I recommend continuing oral vancomycin for 14 days total.  PLAN: 1. Continue oral vancomycin pending further observation and final blood culture results  Principal Problem:   Sepsis ([place]) Active Problems:   Recurrent UTI   Diarrhea   Acute renal failure superimposed on chronic kidney disease (HCC)   Hypothyroid   Hypertension   Chronic diastolic CHF (congestive heart failure) (HCC)   History of pulmonary embolism   History of COVID-19   CKD (chronic kidney disease), stage III   Macrocytic anemia   GERD (gastroesophageal reflux disease)   Bradycardia   Atherosclerotic peripheral vascular disease (HCC)   Scheduled Meds: . acidophilus  2 capsule Oral BID  . Chlorhexidine Gluconate Cloth  6 each Topical Q0600  . febuxostat  80 mg Oral Daily  . heparin  5,000 Units Subcutaneous Q8H  . levothyroxine  75 mcg Oral Q0600  . LORazepam  0.5 mg Oral QPC lunch  . potassium chloride  40 mEq Oral BID  . traZODone  50 mg Oral QHS  . vancomycin  125 mg Oral QID   Continuous Infusions: PRN Meds:.acetaminophen **OR** acetaminophen, acetaminophen, ondansetron **OR** ondansetron (ZOFRAN) IV   SUBJECTIVE: "Can you please get me something to help me sleep".  She says there have been no change in her chronic nausea  and leg pain.  No vomiting or abdominal pain.  Her nurse reports that her diarrhea has resolved.  Review of Systems: Review of Systems  Constitutional: Negative for fever.  Respiratory: Negative for cough.   Cardiovascular: Negative for chest pain.  Gastrointestinal: Positive for nausea. Negative for abdominal pain, diarrhea and vomiting.  Genitourinary: Negative for dysuria.  Musculoskeletal: Positive for joint pain.  Neurological: Negative for headaches.  Psychiatric/Behavioral: The patient has insomnia.     Allergies  Allergen Reactions  . Morphine And Related   . Cephalexin Hives, Swelling and Rash  . Penicillins Swelling and Rash    Did it involve swelling of the face/tongue/throat, SOB, or low BP? Unknown Did it involve sudden or severe rash/hives, skin peeling, or any reaction on the inside of your mouth or nose? Unknown Did you need to seek medical attention at a hospital or doctor's office? Unknown When did it last happen? Unknown If all above answers are "NO", may proceed with cephalosporin use.     OBJECTIVE: Vitals:   01/21/20 0500 01/21/20 0600 01/21/20 0739 01/21/20 0915  BP: (!) 131/51 (!) 121/43  (!) 154/48  Pulse: 93 (!) 116  89  Resp: (!) 27 (!) 23  (!) 24  Temp:   98.1 F (36.7 C)   TempSrc:   Oral   SpO2: 94% 95%  96%  Weight:      Height:       Body  mass index is 24.45 kg/m.  Physical Exam Constitutional:      Comments: She is much more alert and interactive today.  She is even able to make a joke.  Cardiovascular:     Rate and Rhythm: Normal rate and regular rhythm.     Heart sounds: No murmur.  Pulmonary:     Effort: Pulmonary effort is normal.     Breath sounds: Normal breath sounds.  Abdominal:     Palpations: Abdomen is soft.     Tenderness: There is no abdominal tenderness.  Skin:    Findings: No rash.  Psychiatric:        Mood and Affect: Mood normal.     Lab Results Lab Results  Component Value Date   WBC 12.7 (H)  01/21/2020   HGB 10.1 (L) 01/21/2020   HCT 31.8 (L) 01/21/2020   MCV 98.1 01/21/2020   PLT 290 01/21/2020    Lab Results  Component Value Date   CREATININE 2.03 (H) 01/21/2020   BUN 70 (H) 01/21/2020   NA 141 01/21/2020   K 3.0 (L) 01/21/2020   CL 109 01/21/2020   CO2 21 (L) 01/21/2020    Lab Results  Component Value Date   ALT 10 01/21/2020   AST 15 01/21/2020   ALKPHOS 74 01/21/2020   BILITOT 0.3 01/21/2020     Microbiology: Recent Results (from the past 240 hour(s))  Blood culture (routine x 2)     Status: None (Preliminary result)   Collection Time: 01/19/20  4:08 PM   Specimen: BLOOD LEFT FOREARM  Result Value Ref Range Status   Specimen Description   Final    BLOOD LEFT FOREARM Performed at [facility], [place] [address], [place], [place] [address]    Special Requests   Final    BOTTLES DRAWN AEROBIC AND ANAEROBIC Blood Culture adequate volume Performed at [place] [address], [place], [place] [address]    Culture  Setup Time   Final    ANAEROBIC BOTTLE ONLY GRAM POSITIVE COCCI IN CLUSTERS CRITICAL RESULT CALLED TO, READ BACK BY AND VERIFIED WITH: Lavell Luster [facility] 01/21/20 0455 JDW    Culture   Final    NO GROWTH 2 DAYS Performed at [facility], [place] [address] [place], [place] [address]    Report Status PENDING  Incomplete  Blood Culture ID Panel (Reflexed)     Status: None   Collection Time: 01/19/20  4:08 PM  Result Value Ref Range Status   Enterococcus species NOT DETECTED NOT DETECTED Final   Listeria monocytogenes NOT DETECTED NOT DETECTED Final   Staphylococcus species NOT DETECTED NOT DETECTED Final   Staphylococcus aureus (BCID) NOT DETECTED NOT DETECTED Final   Streptococcus species NOT DETECTED NOT DETECTED Final   Streptococcus agalactiae NOT DETECTED NOT DETECTED Final   Streptococcus pneumoniae NOT DETECTED NOT DETECTED Final   Streptococcus pyogenes NOT DETECTED NOT DETECTED Final    Acinetobacter baumannii NOT DETECTED NOT DETECTED Final   Enterobacteriaceae species NOT DETECTED NOT DETECTED Final   Enterobacter cloacae complex NOT DETECTED NOT DETECTED Final   Escherichia coli NOT DETECTED NOT DETECTED Final   Klebsiella oxytoca NOT DETECTED NOT DETECTED Final   Klebsiella pneumoniae NOT DETECTED NOT DETECTED Final   Proteus species NOT DETECTED NOT DETECTED Final   Serratia marcescens NOT DETECTED NOT DETECTED Final   Haemophilus influenzae NOT DETECTED NOT DETECTED Final   Neisseria meningitidis NOT DETECTED NOT DETECTED Final   Pseudomonas  aeruginosa NOT DETECTED NOT DETECTED Final   Candida albicans NOT DETECTED NOT DETECTED Final   Candida glabrata NOT DETECTED NOT DETECTED Final   Candida krusei NOT DETECTED NOT DETECTED Final   Candida parapsilosis NOT DETECTED NOT DETECTED Final   Candida tropicalis NOT DETECTED NOT DETECTED Final    Comment: Performed at [facility], [place] [address], [place], [place] [address]  SARS Coronavirus 2 by RT PCR (hospital order, performed in [facility] hospital lab) Nasopharyngeal Nasopharyngeal Swab     Status: None   Collection Time: 01/19/20  4:23 PM   Specimen: Nasopharyngeal Swab  Result Value Ref Range Status   SARS Coronavirus 2 NEGATIVE NEGATIVE Final    Comment: (NOTE) SARS-CoV-2 target nucleic acids are NOT DETECTED. The SARS-CoV-2 RNA is generally detectable in upper and lower respiratory specimens during the acute phase of infection. The lowest concentration of SARS-CoV-2 viral copies this assay can detect is 250 copies / mL. A negative result does not preclude SARS-CoV-2 infection and should not be used as the sole basis for treatment or other patient management decisions.  A negative result may occur with improper specimen collection / handling, submission of specimen other than nasopharyngeal swab, presence of viral mutation(s) within the areas targeted by this assay, and inadequate number of viral  copies (<250 copies / mL). A negative result must be combined with clinical observations, patient history, and epidemiological information. Fact Sheet for Patients:   StrictlyIdeas.no Fact Sheet for Healthcare Providers: BankingDealers.co.za This test is not yet approved or cleared  by the [place] FDA and has been authorized for detection and/or diagnosis of SARS-CoV-2 by FDA under an Emergency Use Authorization (EUA).  This EUA will remain in effect (meaning this test can be used) for the duration of the COVID-19 declaration under Section 564(b)(1) of the Act, 21 U.S.C. section 360bbb-3(b)(1), unless the authorization is terminated or revoked sooner. Performed at [facility], [place] [address] [place], [place] [address]   Urine culture     Status: None (Preliminary result)   Collection Time: 01/19/20  6:03 PM   Specimen: Urine, Random  Result Value Ref Range Status   Specimen Description   Final    URINE, RANDOM Performed at [place] [address] [place], [place] [address]    Special Requests   Final    NONE Performed at [facility], [place] [address], [place], [place] [address]    Culture   Final    CULTURE REINCUBATED FOR BETTER GROWTH Performed at [facility], [place] [address] [place], [place] [address]    Report Status PENDING  Incomplete  C Difficile Quick Screen w PCR reflex     Status: Abnormal   Collection Time: 01/19/20  6:03 PM   Specimen: Stool  Result Value Ref Range Status   C Diff antigen POSITIVE (A) NEGATIVE Final   C Diff toxin NEGATIVE NEGATIVE Final   C Diff interpretation Results are indeterminate. See PCR results.  Final    Comment: Performed at [facility], [place] [address], [place], [place] [address]  C. Diff by PCR, Reflexed     Status: Abnormal   Collection Time: 01/19/20  6:03 PM  Result Value Ref Range Status     Toxigenic C. Difficile by PCR POSITIVE (A) NEGATIVE Final    Comment: Positive for toxigenic C. difficile with little to no toxin production. Only treat if clinical presentation suggests symptomatic illness. Performed at  [facility], [place] [address] [place], [place] [address]   MRSA PCR Screening     Status: None   Collection Time: 01/19/20  9:45 PM   Specimen: Nasopharyngeal  Result Value Ref Range Status   MRSA by PCR NEGATIVE NEGATIVE Final    Comment:        The GeneXpert MRSA Assay (FDA approved for NASAL specimens only), is one component of a comprehensive MRSA colonization surveillance program. It is not intended to diagnose MRSA infection nor to guide or monitor treatment for MRSA infections. Performed at [facility], [place] [address], [place], [place] [address]   Blood culture (routine x 2)     Status: None (Preliminary result)   Collection Time: 01/19/20 10:07 PM   Specimen: BLOOD LEFT ARM  Result Value Ref Range Status   Specimen Description   Final    BLOOD LEFT ARM Performed at [place] [address], [place], [place] [address]    Special Requests   Final    BOTTLES DRAWN AEROBIC ONLY Blood Culture adequate volume Performed at [place] [address], [place], [place] [address]    Culture   Final    NO GROWTH 2 DAYS Performed at [place] [address], [place], [place] [address]    Report Status PENDING  Incomplete    Michel Bickers, MD [facility] [place] [facility] [phone] pager   [phone] cell 01/21/2020, 10:50 AM

## 2020-01-21 NOTE — Progress Notes (Signed)
PROGRESS NOTE    Kendle Turbin  BJS:[id number] DOB: 16-Mar-1927 DOA: 01/19/2020 PCP: Patient, No Pcp Per   Brief Narrative:  HPI per Dr. Mikki Harbor on 01/19/20  Jalesia Loudenslager is a 86 y.o. female with HTN, hypothyroidism, GERD, DHF/LE swelling, CKD baseline Crt 1.6, chronic pain, gout and history of recurrent UTIs was sent from [facility] with concern for dehydration.  Apparently patient has been nauseous and facility got basic labs and KUB to evaluate.  Lab work indicated mild worsening of renal function with BUN of 95 and creatinine of 2.59 while KUB indicated possible ileus.  Patient extremely hard of hearing which is a major impediment to her history.  She reports history of nausea and loose stools but denies any vomiting.  Denies any abdominal pain.  Personal social history: Resident of [employer].  Reports is widowed with children.  Denies any smoking, drinking or illicit drug use.  **Interim History  Cardiology and infectious disease were consulted for further evaluation.  Cardiology ordered an echo and have signed off the case now.  Infectious disease recommending de-escalating antibiotics to just p.o. vancomycin.  He is going 1 out of 2 blood cultures of unidentified cocci that may be contaminant per ID they do not recommend restarting IV vancomycin they recommend continuing p.o. oral vancomycin for total 14 days for possible C. difficile colitis  Assessment & Plan:   Principal Problem:   Sepsis ([place]) Active Problems:   Acute renal failure superimposed on chronic kidney disease ([place])   Recurrent UTI   Hypothyroid   Hypertension   Chronic diastolic CHF (congestive heart failure) (HCC)   History of pulmonary embolism   History of COVID-19   CKD (chronic kidney disease), stage III   Diarrhea   Macrocytic anemia   GERD (gastroesophageal reflux disease)   Bradycardia   Atherosclerotic peripheral vascular disease (HCC)  Sepsis, poA and likely in the  setting of C. difficile colitis -Question UTI  Vs colitis in the setting of C. difficile -Patient already received 2.5 L in the ED.  Will be careful with IV hydration given history of diastolic heart failure and currently on Sodium Bicarbonate at 75 mL/hr x1 Day  -She is positive for the antigen as well as PCR positive but negative for toxin -Meropenem/Flagyl (meropenem does cover anaerobes) initiated in the ED however this will be stopped per ID recommendations -Infectious diseases recommending continue with oral vancomycin for concern for C. difficile -ID consult  this a.m. and Dr. Megan Salon favors continue vancomycin for now stopping meropenem and metronidazole and does not feel that the urine specimen has been to be very helpful given the does not appear to be a catheterized specimen.  He feels that even though her C. difficile toxin is negative she is certainly at risk for C. difficile given her recent hospitalization and antibiotic exposures -Patient's procalcitonin level was 0.87 and is now 0.22 -WBC is elevated and went from 18.4 -> 19.5 and is now improving and is 12.7 -Blood cultures grew 1 out of 2 culture showing unidentified gram-positive cocci in clusters that may be a contaminant and infectious diseases does not recommend starting IV vancomycin again -Continue to Follow Cx's and Repeat CBC in the AM   AKI on CKD stage IIIa  Metabolic Acidosis  -BUN and creatinine on admission was 95/2.75 and after fluid hydration she is improved to 70/2.03 -Patient has a Metabolic Acidosis with a  CO2 of 12, Chloride 117, and AG of 12; now this is improved with  a CO2 of 21, and anion gap of 11 and a chloride level of 109 -Foley to be placed in ED. -Holding torsemide, Lactulose. -Avoid nephrotoxic medications, contrast dyes, hypotension and renally adjust medications  -Continue to monitor and trend renal function repeat CMP in a.m.  Sick sinus syndrome/Sinus Bradycardia -While on stepdown, pt had  a sinus pause >3sec. -c/t hold BB.  -Consulted Cardiology and she was asymptomatic and this happened when she was asleep and Cardiology feels that it iwa sin the setting of Sepsis -Cardiology Recommending Avoiding AV nodal blocking agents and no further intervention   Chronic Diastolic CHF -BNP on admission was 155.9 -Echocardiogram was ordered by cardiology and is below -Chest x-ray did show small left pleural effusion -Currently her torsemide is being held and she is given IV fluids with sodium bicarbonate at 75 mL's per hour and this is now stopped today -Continue to monitor volume status carefully and she does not appear to be significantly volume overloaded on examination.  Presumed PE -Pt completed 3 months of Eliquis on 01/11/2020.  HTN -Holding hydralazine, Imdur, Toprol-XL for borderline BPs -Also holding torsemide. -Continue to monitor carefully  Hypokalemia -Patient's potassium was 3.0  -Replete -Continue to monitor and replete as necessary  Hypothyroidism -C/w Levothyroxine 75 mcg po Daily   GERD -Initially continued her PPI however with concern for C. difficile will discontinue at this time  Hypoalbuminemia  -2.6  Gout -C/w Febuxostat 80 mg po Daily   Normocytic anemia/anemia of chronic kidney disease -The patient's hemoglobin/hematocrit went from 10.9/36.4 is now 10.1/31.8  -Check anemia panel in a.m.  -Continue to monitor for signs and symptoms of bleeding; currently no overt bleeding noted -Repeat CBC in a.m.  DVT prophylaxis: Heparin 5,000 units sq q8h Code Status: DO NOT RESUSCITATE  Family Communication: No family present at bedside  Disposition Plan: Pending further improvement and work-up; will need PT OT to further evaluate and treat they recommending SNF; have also called palliative care for goals of care discussion  Status is: Inpatient  Remains inpatient appropriate because:Unsafe d/c plan and IV treatments appropriate due to intensity of  illness or inability to take PO   Dispo: The patient is from: ALF              Anticipated d/c is to: SNF              Anticipated d/c date is: 2 days              Patient currently is not medically stable to d/c.  Consultants:   Infectious Diseases  Cardiology   Palliative care   Procedures:  ECHOCARDIOGRAM 1. Left ventricular ejection fraction, by estimation, is 60 to 65%. The  left ventricle has normal function. The left ventricle has no regional  wall motion abnormalities. There is mild left ventricular hypertrophy.  Left ventricular diastolic parameters  are consistent with age-related delayed relaxation (normal).  2. Right ventricular systolic function is normal. The right ventricular  size is normal. There is mildly elevated pulmonary artery systolic  pressure.  3. Nodular calcification of the anterior leaflet tip. The mitral valve is  degenerative. Trivial mitral valve regurgitation. No evidence of mitral  stenosis.  4. The aortic valve is tricuspid. Aortic valve regurgitation is not  visualized. Mild aortic valve stenosis.  5. The inferior vena cava is dilated in size with >50% respiratory  variability, suggesting right atrial pressure of 8 mmHg.   FINDINGS  Left Ventricle: Left ventricular ejection fraction, by estimation,  is 60  to 65%. The left ventricle has normal function. The left ventricle has no  regional wall motion abnormalities. The left ventricular internal cavity  size was normal in size. There is  mild left ventricular hypertrophy. Left ventricular diastolic parameters  are consistent with age-related delayed relaxation (normal).   Right Ventricle: The right ventricular size is normal. No increase in  right ventricular wall thickness. Right ventricular systolic function is  normal. There is mildly elevated pulmonary artery systolic pressure. The  tricuspid regurgitant velocity is 2.76  m/s, and with an assumed right atrial pressure of 8  mmHg, the estimated  right ventricular systolic pressure is 93.7 mmHg.   Left Atrium: Left atrial size was normal in size.   Right Atrium: Right atrial size was normal in size.   Pericardium: There is no evidence of pericardial effusion.   Mitral Valve: Nodular calcification of the anterior leaflet tip. The  mitral valve is degenerative in appearance. There is moderate thickening  of the mitral valve leaflet(s). There is moderate calcification of the  mitral valve leaflet(s). Normal mobility  of the mitral valve leaflets. Moderate mitral annular calcification.  Trivial mitral valve regurgitation. No evidence of mitral valve stenosis.  MV peak gradient, 9.9 mmHg. The mean mitral valve gradient is 3.0 mmHg.   Tricuspid Valve: The tricuspid valve is normal in structure. Tricuspid  valve regurgitation is trivial. No evidence of tricuspid stenosis.   Aortic Valve: The aortic valve is tricuspid. . There is moderate  thickening and moderate calcification of the aortic valve. Aortic valve  regurgitation is not visualized. Mild aortic stenosis is present. There is  moderate thickening of the aortic valve.  There is moderate calcification of the aortic valve. Aortic valve mean  gradient measures 11.0 mmHg. Aortic valve peak gradient measures 22.2  mmHg. Aortic valve area, by VTI measures 1.23 cm.   Pulmonic Valve: The pulmonic valve was normal in structure. Pulmonic valve  regurgitation is not visualized. No evidence of pulmonic stenosis.   Aorta: The aortic root is normal in size and structure.   Venous: The inferior vena cava is dilated in size with greater than 50%  respiratory variability, suggesting right atrial pressure of 8 mmHg.   IAS/Shunts: No atrial level shunt detected by color flow Doppler.     LEFT VENTRICLE  PLAX 2D  LVIDd:     3.54 cm   Diastology  LVIDs:     1.95 cm   LV e' lateral:  7.02 cm/s  LV PW:     1.42 cm   LV E/e' lateral: 13.8  LV IVS:     1.16 cm   LV e' medial:  4.60 cm/s  LVOT diam:   1.70 cm   LV E/e' medial: 21.0  LV SV:     48  LV SV Index:  29  LVOT Area:   2.27 cm    LV Volumes (MOD)  LV vol d, MOD A2C: 76.7 ml  LV vol d, MOD A4C: 52.5 ml  LV vol s, MOD A2C: 22.4 ml  LV vol s, MOD A4C: 18.4 ml  LV SV MOD A2C:   54.3 ml  LV SV MOD A4C:   52.5 ml  LV SV MOD BP:   41.8 ml   RIGHT VENTRICLE       IVC  RV S prime:   20.80 cm/s IVC diam: 2.22 cm  TAPSE (M-mode): 2.3 cm   LEFT ATRIUM      Index  RIGHT ATRIUM      Index  LA diam:   3.45 cm 2.09 cm/m RA Area:   12.60 cm  LA Vol (A2C): 33.9 ml 20.53 ml/m RA Volume:  31.10 ml 18.83 ml/m  LA Vol (A4C): 36.1 ml 21.86 ml/m  AORTIC VALVE  AV Area (Vmax):  1.20 cm  AV Area (Vmean):  1.30 cm  AV Area (VTI):   1.23 cm  AV Vmax:      235.50 cm/s  AV Vmean:     152.500 cm/s  AV VTI:      0.392 m  AV Peak Grad:   22.2 mmHg  AV Mean Grad:   11.0 mmHg  LVOT Vmax:     125.00 cm/s  LVOT Vmean:    87.500 cm/s  LVOT VTI:     0.212 m  LVOT/AV VTI ratio: 0.54    AORTA  Ao Root diam: 2.90 cm   MITRAL VALVE        TRICUSPID VALVE  MV Area (PHT): 5.02 cm   TR Peak grad:  30.5 mmHg  MV Peak grad: 9.9 mmHg   TR Vmax:    276.00 cm/s  MV Mean grad: 3.0 mmHg  MV Vmax:    1.57 m/s   SHUNTS  MV Vmean:   69.7 cm/s  Systemic VTI: 0.21 m  MV Decel Time: 151 msec   Systemic Diam: 1.70 cm  MV E velocity: 96.70 cm/s  MV A velocity: 158.00 cm/s  MV E/A ratio: 0.61   Antimicrobials:  Anti-infectives (From admission, onward)   Start     Dose/Rate Route Frequency Ordered Stop   01/20/20 1000  vancomycin (VANCOCIN) 50 mg/mL oral solution 125 mg     125 mg Oral 4 times daily 01/20/20 0741 01/30/20 0959   01/20/20 0600  meropenem (MERREM) 500 mg in sodium chloride 0.9 % 100 mL IVPB  Status:  Discontinued     500 mg 200 mL/hr over 30 Minutes  Intravenous Every 12 hours 01/19/20 2104 01/20/20 1519   01/20/20 0200  meropenem (MERREM) 1 g in sodium chloride 0.9 % 100 mL IVPB  Status:  Discontinued     1 g 200 mL/hr over 30 Minutes Intravenous Every 8 hours 01/19/20 2002 01/19/20 2104   01/20/20 0200  metroNIDAZOLE (FLAGYL) IVPB 500 mg  Status:  Discontinued     500 mg 100 mL/hr over 60 Minutes Intravenous Every 8 hours 01/19/20 2002 01/20/20 1519   01/19/20 1800  metroNIDAZOLE (FLAGYL) IVPB 500 mg     500 mg 100 mL/hr over 60 Minutes Intravenous  Once 01/19/20 1753 01/19/20 1921   01/19/20 1700  meropenem (MERREM) 1 g in sodium chloride 0.9 % 100 mL IVPB     1 g 200 mL/hr over 30 Minutes Intravenous  Once 01/19/20 1634 01/19/20 1808     Subjective: Seen and examined at bedside and she is extremely hard of hearing still but still complaining of some leg pain.  Wanting to sleep and wanting to rest did not really want to eat much.  No chest pain, tightness or dizziness.  No other concerns reported at this time.  Objective: Vitals:   01/21/20 1230 01/21/20 1630 01/21/20 1800 01/21/20 1904  BP: (!) 132/49  (!) 126/51   Pulse: 94  (!) 106   Resp: (!) 27  (!) 25   Temp: 97.6 F (36.4 C) 98 F (36.7 C)  97.6 F (36.4 C)  TempSrc: Oral Oral  Oral  SpO2: 94%  95%  Weight:      Height:        Intake/Output Summary (Last 24 hours) at 01/21/2020 1957 Last data filed at 01/21/2020 1630 Gross per 24 hour  Intake 1410.11 ml  Output 850 ml  Net 560.11 ml   Filed Weights   01/19/20 2139  Weight: 62.6 kg   Examination: Physical Exam:  Constitutional: Patient is a thin elderly Caucasian female currently in no acute distress appears calm but she does appear uncomfortable and wanting to sleep Eyes: Lids and conjunctivae normal, sclerae anicteric  ENMT: External Ears, Nose appear normal. Grossly normal hearing Neck: Appears normal, supple, no cervical masses, normal ROM, no appreciable thyromegaly: No JVD Respiratory: Diminished  to auscultation bilaterally, no wheezing, rales, rhonchi or crackles. Normal respiratory effort and patient is not tachypenic. No accessory muscle use.  Unlabored breathing Cardiovascular: RRR, has a 2 out of 6 systolic murmur.  Trace extremity edema. 2+ pedal pulses. No carotid bruits.  Abdomen: Soft, non-tender, non-distended. Bowel sounds positive.  GU: Deferred. Musculoskeletal: No clubbing / cyanosis of digits/nails. No joint deformity upper and lower extremities but she has pain on palpation of her lower extremities Skin: No rashes, lesions, ulcers. No induration; Warm and dry.  Neurologic: CN 2-12 grossly intact with no focal deficits.  Romberg sign and cerebellar reflexes not assessed.  Psychiatric: Normal judgment and insight. Alert and oriented x 2. Normal mood and appropriate affect.     Data Reviewed: I have personally reviewed following labs and imaging studies  CBC: Recent Labs  Lab 01/19/20 1454 01/20/20 0212 01/21/20 0259  WBC 18.4* 19.5* 12.7*  NEUTROABS 15.2* 17.0* 9.9*  HGB 12.2 10.9* 10.1*  HCT 39.7 36.4 31.8*  MCV 100.5* 104.6* 98.1  PLT 303 297 175   Basic Metabolic Panel: Recent Labs  Lab 01/19/20 1454 01/20/20 0212 01/21/20 0259  NA 137 141 141  K 4.3 3.8 3.0*  CL 108 117* 109  CO2 17* 12* 21*  GLUCOSE 86 65* 111*  BUN 95* 83* 70*  CREATININE 2.75* 2.29* 2.03*  CALCIUM 9.1 8.6* 8.3*  MG  --   --  1.7  PHOS  --   --  3.3   GFR: Estimated Creatinine Clearance: 14.9 mL/min (A) (by C-G formula based on SCr of 2.03 mg/dL (H)). Liver Function Tests: Recent Labs  Lab 01/19/20 1454 01/20/20 0212 01/21/20 0259  AST 17 18 15   ALT 13 11 10   ALKPHOS 97 82 74  BILITOT 0.4 0.4 0.3  PROT 6.6 5.5* 5.2*  ALBUMIN 2.6* 2.2* 2.1*   Recent Labs  Lab 01/19/20 1454  LIPASE 50   No results for input(s): AMMONIA in the last 168 hours. Coagulation Profile: No results for input(s): INR, PROTIME in the last 168 hours. Cardiac Enzymes: No results for  input(s): CKTOTAL, CKMB, CKMBINDEX, TROPONINI in the last 168 hours. BNP (last 3 results) No results for input(s): PROBNP in the last 8760 hours. HbA1C: No results for input(s): HGBA1C in the last 72 hours. CBG: No results for input(s): GLUCAP in the last 168 hours. Lipid Profile: No results for input(s): CHOL, HDL, LDLCALC, TRIG, CHOLHDL, LDLDIRECT in the last 72 hours. Thyroid Function Tests: No results for input(s): TSH, T4TOTAL, FREET4, T3FREE, THYROIDAB in the last 72 hours. Anemia Panel: No results for input(s): VITAMINB12, FOLATE, FERRITIN, TIBC, IRON, RETICCTPCT in the last 72 hours. Sepsis Labs: Recent Labs  Lab 01/19/20 1454 01/19/20 1608 01/19/20 1825 01/20/20 0212 01/21/20 0259  PROCALCITON 0.87  --   --  0.20  0.23  LATICACIDVEN  --  0.8 0.6  --   --     Recent Results (from the past 240 hour(s))  Blood culture (routine x 2)     Status: None (Preliminary result)   Collection Time: 01/19/20  4:08 PM   Specimen: BLOOD LEFT FOREARM  Result Value Ref Range Status   Specimen Description   Final    BLOOD LEFT FOREARM Performed at [facility] [address], [place], [place] [address]    Special Requests   Final    BOTTLES DRAWN AEROBIC AND ANAEROBIC Blood Culture adequate volume Performed at [facility] [address] [place], [place] [address]    Culture  Setup Time   Final    ANAEROBIC BOTTLE ONLY GRAM POSITIVE COCCI IN CLUSTERS CRITICAL RESULT CALLED TO, READ BACK BY AND VERIFIED WITH: Lavell Luster [facility] 01/21/20 0455 JDW    Culture   Final    NO GROWTH 2 DAYS Performed at [facility], [place] [address] [place], [place] [address]    Report Status PENDING  Incomplete  Blood Culture ID Panel (Reflexed)     Status: None   Collection Time: 01/19/20  4:08 PM  Result Value Ref Range Status   Enterococcus species NOT DETECTED NOT DETECTED Final   Listeria monocytogenes NOT DETECTED NOT DETECTED Final   Staphylococcus  species NOT DETECTED NOT DETECTED Final   Staphylococcus aureus (BCID) NOT DETECTED NOT DETECTED Final   Streptococcus species NOT DETECTED NOT DETECTED Final   Streptococcus agalactiae NOT DETECTED NOT DETECTED Final   Streptococcus pneumoniae NOT DETECTED NOT DETECTED Final   Streptococcus pyogenes NOT DETECTED NOT DETECTED Final   Acinetobacter baumannii NOT DETECTED NOT DETECTED Final   Enterobacteriaceae species NOT DETECTED NOT DETECTED Final   Enterobacter cloacae complex NOT DETECTED NOT DETECTED Final   Escherichia coli NOT DETECTED NOT DETECTED Final   Klebsiella oxytoca NOT DETECTED NOT DETECTED Final   Klebsiella pneumoniae NOT DETECTED NOT DETECTED Final   Proteus species NOT DETECTED NOT DETECTED Final   Serratia marcescens NOT DETECTED NOT DETECTED Final   Haemophilus influenzae NOT DETECTED NOT DETECTED Final   Neisseria meningitidis NOT DETECTED NOT DETECTED Final   Pseudomonas aeruginosa NOT DETECTED NOT DETECTED Final   Candida albicans NOT DETECTED NOT DETECTED Final   Candida glabrata NOT DETECTED NOT DETECTED Final   Candida krusei NOT DETECTED NOT DETECTED Final   Candida parapsilosis NOT DETECTED NOT DETECTED Final   Candida tropicalis NOT DETECTED NOT DETECTED Final    Comment: Performed at [facility], [place] [address], [place], [place] [address]  SARS Coronavirus 2 by RT PCR (hospital order, performed in [facility] lab) Nasopharyngeal Nasopharyngeal Swab     Status: None   Collection Time: 01/19/20  4:23 PM   Specimen: Nasopharyngeal Swab  Result Value Ref Range Status   SARS Coronavirus 2 NEGATIVE NEGATIVE Final    Comment: (NOTE) SARS-CoV-2 target nucleic acids are NOT DETECTED. The SARS-CoV-2 RNA is generally detectable in upper and lower respiratory specimens during the acute phase of infection. The lowest concentration of SARS-CoV-2 viral copies this assay can detect is 250 copies / mL. A negative result does not preclude SARS-CoV-2  infection and should not be used as the sole basis for treatment or other patient management decisions.  A negative result may occur with improper specimen collection / handling, submission of specimen other than nasopharyngeal swab, presence of viral mutation(s) within the areas targeted  by this assay, and inadequate number of viral copies (<250 copies / mL). A negative result must be combined with clinical observations, patient history, and epidemiological information. Fact Sheet for Patients:   StrictlyIdeas.no Fact Sheet for Healthcare Providers: BankingDealers.co.za This test is not yet approved or cleared  by the [place] FDA and has been authorized for detection and/or diagnosis of SARS-CoV-2 by FDA under an Emergency Use Authorization (EUA).  This EUA will remain in effect (meaning this test can be used) for the duration of the COVID-19 declaration under Section 564(b)(1) of the Act, 21 U.S.C. section 360bbb-3(b)(1), unless the authorization is terminated or revoked sooner. Performed at [facility], [place] [address] [place], [place] [address]   Urine culture     Status: Abnormal (Preliminary result)   Collection Time: 01/19/20  6:03 PM   Specimen: Urine, Random  Result Value Ref Range Status   Specimen Description   Final    URINE, RANDOM Performed at [facility] [address] [place], [place] [address]    Special Requests   Final    NONE Performed at [facility], [place] [address] [place], [place] [address]    Culture >=100,000 COLONIES/mL PSEUDOMONAS AERUGINOSA (A)  Final   Report Status PENDING  Incomplete  C Difficile Quick Screen w PCR reflex     Status: Abnormal   Collection Time: 01/19/20  6:03 PM   Specimen: Stool  Result Value Ref Range Status   C Diff antigen POSITIVE (A) NEGATIVE Final   C Diff toxin NEGATIVE NEGATIVE Final   C Diff interpretation  Results are indeterminate. See PCR results.  Final    Comment: Performed at [facility], [place] [address], [place], [place] [address]  C. Diff by PCR, Reflexed     Status: Abnormal   Collection Time: 01/19/20  6:03 PM  Result Value Ref Range Status   Toxigenic C. Difficile by PCR POSITIVE (A) NEGATIVE Final    Comment: Positive for toxigenic C. difficile with little to no toxin production. Only treat if clinical presentation suggests symptomatic illness. Performed at [facility], [place] [address], [place], [place] [address]   MRSA PCR Screening     Status: None   Collection Time: 01/19/20  9:45 PM   Specimen: Nasopharyngeal  Result Value Ref Range Status   MRSA by PCR NEGATIVE NEGATIVE Final    Comment:        The GeneXpert MRSA Assay (FDA approved for NASAL specimens only), is one component of a comprehensive MRSA colonization surveillance program. It is not intended to diagnose MRSA infection nor to guide or monitor treatment for MRSA infections. Performed at [facility], [place] [address], [place], [place] [address]   Blood culture (routine x 2)     Status: None (Preliminary result)   Collection Time: 01/19/20 10:07 PM   Specimen: BLOOD LEFT ARM  Result Value Ref Range Status   Specimen Description   Final    BLOOD LEFT ARM Performed at [facility] [address], [place], [place] [address]    Special Requests   Final    BOTTLES DRAWN AEROBIC ONLY Blood Culture adequate volume Performed at [facility] [address] [place], [place] [address]    Culture   Final    NO GROWTH 2 DAYS Performed at [facility] [address], [place], [place] [address]    Report Status PENDING  Incomplete  RN Pressure Injury Documentation: Pressure Injury 09/21/19 Buttocks Left Stage 2 -  Partial thickness loss of dermis presenting as a shallow open injury with a red, pink wound bed without  slough. (Active)  09/21/19 1600  Location: Buttocks  Location Orientation: Left  Staging: Stage 2 -  Partial thickness loss of dermis presenting as a shallow open injury with a red, pink wound bed without slough.  Wound Description (Comments):   Present on Admission: Yes     Pressure Injury 09/21/19 Buttocks Right Stage 1 -  Intact skin with non-blanchable redness of a localized area usually over a bony prominence. (Active)  09/21/19 1600  Location: Buttocks  Location Orientation: Right  Staging: Stage 1 -  Intact skin with non-blanchable redness of a localized area usually over a bony prominence.  Wound Description (Comments):   Present on Admission: Yes     Estimated body mass index is 24.45 kg/m as calculated from the following:   Height as of this encounter: 5\' 3"  (1.6 m).   Weight as of this encounter: 62.6 kg.  Malnutrition Type:      Malnutrition Characteristics:      Nutrition Interventions:    Radiology Studies: DG CHEST PORT 1 VIEW  Result Date: 01/21/2020 CLINICAL DATA:  Shortness of breath. EXAM: PORTABLE CHEST 1 VIEW COMPARISON:  01/19/2020 FINDINGS: Patient slightly rotated to the left as patient's head/chin obscures a portion of the lung apices. Lungs are adequately inflated with persistent left base opacification likely small effusion with atelectasis. Subtle prominence of the perihilar markings suggesting a mild degree of vascular congestion. Cardiomediastinal silhouette and remainder of the exam is unchanged. IMPRESSION: Stable mild left base opacification likely small effusion with atelectasis. Possible component of mild vascular congestion. Electronically Signed   By: Marin Olp M.D.   On: 01/21/2020 08:02   ECHOCARDIOGRAM COMPLETE  Result Date: 01/20/2020    ECHOCARDIOGRAM REPORT   Patient Name:   RAVYN NIKKEL Date of Exam: 01/20/2020 Medical Rec #:  [id number]      Height:       63.0 in Accession #:    [id number]     Weight:       138.0 lb Date of  Birth:  12/28/1926      BSA:          1.652 m Patient Age:    79 years       BP:           111/42 mmHg Patient Gender: F              HR:           94 bpm. Exam Location:  Inpatient Procedure: 2D Echo, Cardiac Doppler and Color Doppler Indications:    I50.20* Unspecified systolic (congestive) heart failure  History:        Patient has no prior history of Echocardiogram examinations.                 CHF, Abnormal ECG, Signs/Symptoms:Bacteremia; Risk                 Factors:Hypertension. History of pulmonary embolus.  Sonographer:    Roseanna Rainbow RDCS Referring Phys: [id number] Kimberly  Sonographer Comments: Technically difficult study due to poor echo windows. Image acquisition challenging due to uncooperative patient. Patient uncomfortable during exam. Patient was very sensitive to pressure form probe. IMPRESSIONS  1. Left ventricular ejection fraction, by estimation, is 60 to 65%. The left ventricle has normal function. The left ventricle  has no regional wall motion abnormalities. There is mild left ventricular hypertrophy. Left ventricular diastolic parameters are consistent with age-related delayed relaxation (normal).  2. Right ventricular systolic function is normal. The right ventricular size is normal. There is mildly elevated pulmonary artery systolic pressure.  3. Nodular calcification of the anterior leaflet tip. The mitral valve is degenerative. Trivial mitral valve regurgitation. No evidence of mitral stenosis.  4. The aortic valve is tricuspid. Aortic valve regurgitation is not visualized. Mild aortic valve stenosis.  5. The inferior vena cava is dilated in size with >50% respiratory variability, suggesting right atrial pressure of 8 mmHg. FINDINGS  Left Ventricle: Left ventricular ejection fraction, by estimation, is 60 to 65%. The left ventricle has normal function. The left ventricle has no regional wall motion abnormalities. The left ventricular internal cavity size was normal in size. There is   mild left ventricular hypertrophy. Left ventricular diastolic parameters are consistent with age-related delayed relaxation (normal). Right Ventricle: The right ventricular size is normal. No increase in right ventricular wall thickness. Right ventricular systolic function is normal. There is mildly elevated pulmonary artery systolic pressure. The tricuspid regurgitant velocity is 2.76  m/s, and with an assumed right atrial pressure of 8 mmHg, the estimated right ventricular systolic pressure is 20.2 mmHg. Left Atrium: Left atrial size was normal in size. Right Atrium: Right atrial size was normal in size. Pericardium: There is no evidence of pericardial effusion. Mitral Valve: Nodular calcification of the anterior leaflet tip. The mitral valve is degenerative in appearance. There is moderate thickening of the mitral valve leaflet(s). There is moderate calcification of the mitral valve leaflet(s). Normal mobility of the mitral valve leaflets. Moderate mitral annular calcification. Trivial mitral valve regurgitation. No evidence of mitral valve stenosis. MV peak gradient, 9.9 mmHg. The mean mitral valve gradient is 3.0 mmHg. Tricuspid Valve: The tricuspid valve is normal in structure. Tricuspid valve regurgitation is trivial. No evidence of tricuspid stenosis. Aortic Valve: The aortic valve is tricuspid. . There is moderate thickening and moderate calcification of the aortic valve. Aortic valve regurgitation is not visualized. Mild aortic stenosis is present. There is moderate thickening of the aortic valve. There is moderate calcification of the aortic valve. Aortic valve mean gradient measures 11.0 mmHg. Aortic valve peak gradient measures 22.2 mmHg. Aortic valve area, by VTI measures 1.23 cm. Pulmonic Valve: The pulmonic valve was normal in structure. Pulmonic valve regurgitation is not visualized. No evidence of pulmonic stenosis. Aorta: The aortic root is normal in size and structure. Venous: The inferior vena  cava is dilated in size with greater than 50% respiratory variability, suggesting right atrial pressure of 8 mmHg. IAS/Shunts: No atrial level shunt detected by color flow Doppler.  LEFT VENTRICLE PLAX 2D LVIDd:         3.54 cm     Diastology LVIDs:         1.95 cm     LV e' lateral:   7.02 cm/s LV PW:         1.42 cm     LV E/e' lateral: 13.8 LV IVS:        1.16 cm     LV e' medial:    4.60 cm/s LVOT diam:     1.70 cm     LV E/e' medial:  21.0 LV SV:         48 LV SV Index:   29 LVOT Area:     2.27 cm  LV Volumes (MOD) LV vol d, MOD  A2C: 76.7 ml LV vol d, MOD A4C: 52.5 ml LV vol s, MOD A2C: 22.4 ml LV vol s, MOD A4C: 18.4 ml LV SV MOD A2C:     54.3 ml LV SV MOD A4C:     52.5 ml LV SV MOD BP:      41.8 ml RIGHT VENTRICLE             IVC RV S prime:     20.80 cm/s  IVC diam: 2.22 cm TAPSE (M-mode): 2.3 cm LEFT ATRIUM           Index       RIGHT ATRIUM           Index LA diam:      3.45 cm 2.09 cm/m  RA Area:     12.60 cm LA Vol (A2C): 33.9 ml 20.53 ml/m RA Volume:   31.10 ml  18.83 ml/m LA Vol (A4C): 36.1 ml 21.86 ml/m  AORTIC VALVE AV Area (Vmax):    1.20 cm AV Area (Vmean):   1.30 cm AV Area (VTI):     1.23 cm AV Vmax:           235.50 cm/s AV Vmean:          152.500 cm/s AV VTI:            0.392 m AV Peak Grad:      22.2 mmHg AV Mean Grad:      11.0 mmHg LVOT Vmax:         125.00 cm/s LVOT Vmean:        87.500 cm/s LVOT VTI:          0.212 m LVOT/AV VTI ratio: 0.54  AORTA Ao Root diam: 2.90 cm MITRAL VALVE                TRICUSPID VALVE MV Area (PHT): 5.02 cm     TR Peak grad:   30.5 mmHg MV Peak grad:  9.9 mmHg     TR Vmax:        276.00 cm/s MV Mean grad:  3.0 mmHg MV Vmax:       1.57 m/s     SHUNTS MV Vmean:      69.7 cm/s    Systemic VTI:  0.21 m MV Decel Time: 151 msec     Systemic Diam: 1.70 cm MV E velocity: 96.70 cm/s MV A velocity: 158.00 cm/s MV E/A ratio:  0.61 Jenkins Rouge MD Electronically signed by Jenkins Rouge MD Signature Date/Time: 01/20/2020/1:53:13 PM    Final    Scheduled Meds: .  acidophilus  2 capsule Oral BID  . Chlorhexidine Gluconate Cloth  6 each Topical Q0600  . febuxostat  80 mg Oral Daily  . heparin  5,000 Units Subcutaneous Q8H  . HYDROcodone-acetaminophen  1 tablet Oral TID  . levothyroxine  75 mcg Oral Q0600  . LORazepam  0.5 mg Oral QPC lunch  . potassium chloride  40 mEq Oral BID  . traZODone  50 mg Oral QHS  . vancomycin  125 mg Oral QID   Continuous Infusions:   LOS: 2 days   Kerney Elbe, DO [facility] PAGER is on AMION  If 7PM-7AM, please contact night-coverage [email or web address]

## 2020-01-21 NOTE — Progress Notes (Signed)
PHARMACY - PHYSICIAN COMMUNICATION CRITICAL VALUE ALERT - BLOOD CULTURE IDENTIFICATION (BCID)  Krystal Mckenzie is an 86 y.o. female who presented to [facility] on 01/19/2020 with a chief complaint of Dehydration  Assessment:  Patient with GPC in clusters on Gram stain but nothing on BCID per microlab.   (include suspected source if known)  Name of physician (or Provider) Contacted: M. Sharlet Salina  Current antibiotics: Oral Vancomycin  Changes to prescribed antibiotics recommended:  No change to antibiotics at this time. Follow up with ID and AM rounding team, to evaluate fully if will consider contamination or not.  Results for orders placed or performed during the hospital encounter of 01/19/20  Blood Culture ID Panel (Reflexed) (Collected: 01/19/2020  4:08 PM)  Result Value Ref Range   Enterococcus species NOT DETECTED NOT DETECTED   Listeria monocytogenes NOT DETECTED NOT DETECTED   Staphylococcus species NOT DETECTED NOT DETECTED   Staphylococcus aureus (BCID) NOT DETECTED NOT DETECTED   Streptococcus species NOT DETECTED NOT DETECTED   Streptococcus agalactiae NOT DETECTED NOT DETECTED   Streptococcus pneumoniae NOT DETECTED NOT DETECTED   Streptococcus pyogenes NOT DETECTED NOT DETECTED   Acinetobacter baumannii NOT DETECTED NOT DETECTED   Enterobacteriaceae species NOT DETECTED NOT DETECTED   Enterobacter cloacae complex NOT DETECTED NOT DETECTED   Escherichia coli NOT DETECTED NOT DETECTED   Klebsiella oxytoca NOT DETECTED NOT DETECTED   Klebsiella pneumoniae NOT DETECTED NOT DETECTED   Proteus species NOT DETECTED NOT DETECTED   Serratia marcescens NOT DETECTED NOT DETECTED   Haemophilus influenzae NOT DETECTED NOT DETECTED   Neisseria meningitidis NOT DETECTED NOT DETECTED   Pseudomonas aeruginosa NOT DETECTED NOT DETECTED   Candida albicans NOT DETECTED NOT DETECTED   Candida glabrata NOT DETECTED NOT DETECTED   Candida krusei NOT DETECTED NOT DETECTED   Candida  parapsilosis NOT DETECTED NOT DETECTED   Candida tropicalis NOT DETECTED NOT DETECTED    [name] 01/21/2020  5:24 AM

## 2020-01-21 NOTE — TOC Initial Note (Addendum)
Transition of Care [facility]) - Initial/Assessment Note    Patient Details  Name: Krystal Mckenzie MRN: [id number] Date of Birth: Jan 28, 1927  Transition of [facility]) CM/SW Contact:    Leeroy Cha, RN Phone Number: 01/21/2020, 8:07 AM  Clinical Narrative:                 [facility], iv mag so4 and nahco3 infusing.admitted for dehydration, wbc 12.7 bld culture x2 x2days neg./ bun=70 creat 2.03.  Expected Discharge Plan: Skilled Nursing Facility([facility]) Barriers to Discharge: Continued Medical Work up   Patient Goals and CMS Choice Patient states their goals for this hospitalization and ongoing recovery are:: to go home CMS Medicare.gov Compare Post Acute Care list provided to:: Patient    Expected Discharge Plan and Services Expected Discharge Plan: Skilled Nursing Facility([facility])   Discharge Planning Services: CM Consult   Living arrangements for the past 2 months: [place]                                      Prior Living Arrangements/Services Living arrangements for the past 2 months: [place] Lives with:: Facility Resident Patient language and need for interpreter reviewed:: No Do you feel safe going back to the place where you live?: Yes      Need for Family Participation in Patient Care: Yes (Comment) Care giver support system in place?: Yes (comment)   Criminal Activity/Legal Involvement Pertinent to Current Situation/Hospitalization: No - Comment as needed  Activities of Daily Living Home Assistive Devices/Equipment: Other (Comment)(pt at SNF) ADL Screening (condition at time of admission) Patient's cognitive ability adequate to safely complete daily activities?: No Is the patient deaf or have difficulty hearing?: Yes Does the patient have difficulty seeing, even when wearing glasses/contacts?: No Does the patient have difficulty concentrating, remembering, or making decisions?: Yes Patient  able to express need for assistance with ADLs?: Yes Does the patient have difficulty dressing or bathing?: Yes Independently performs ADLs?: No Does the patient have difficulty walking or climbing stairs?: Yes Weakness of Legs: Both Weakness of Arms/Hands: None  Permission Sought/Granted                  Emotional Assessment Appearance:: Appears stated age     Orientation: : Oriented to Self, Oriented to Place, Oriented to  Time, Oriented to Situation Alcohol / Substance Use: Not Applicable Psych Involvement: No (comment)  Admission diagnosis:  Acute cystitis without hematuria [N30.00] AKI (acute kidney injury) ([place]) [N17.9] C. difficile colitis [A04.72] Sepsis ([place]) [A41.9] Sepsis, due to unspecified organism, unspecified whether acute organ dysfunction present [facility]) [A41.9] Patient Active Problem List   Diagnosis Date Noted  . Diarrhea 01/20/2020  . Macrocytic anemia 01/20/2020  . GERD (gastroesophageal reflux disease) 01/20/2020  . Bradycardia 01/20/2020  . Atherosclerotic peripheral vascular disease ([place]) 01/20/2020  . Sepsis ([place]) 01/19/2020  . CKD (chronic kidney disease), stage III 10/06/2019  . History of COVID-19 09/21/2019  . Acute renal failure superimposed on chronic kidney disease ([place]) 09/20/2019  . Recurrent UTI 09/20/2019  . Hypothyroid   . Hypertension   . Chronic diastolic CHF (congestive heart failure) ([place])   . History of pulmonary embolism    PCP:  Patient, No Pcp Per Pharmacy:   [id number] - [place], [place] - [address] AT [place] [address] [place] [address] Phone: [phone] Fax: [phone]     Social Determinants of Health (SDOH) Interventions    Readmission Risk Interventions Readmission Risk Prevention Plan 10/08/2019 09/25/2019  Transportation Screening Complete Complete  HRI or Home Care Consult - Not Complete  HRI or Home Care Consult comments - Going to SNF  Social  Work Consult for [place] Planning/Counseling - Complete  Palliative Care Screening - Not Applicable  Medication Review (RN Care Manager) Referral to Pharmacy Complete  PCP or Specialist appointment within 3-5 days of discharge Complete -  SW Recovery Care/Counseling Consult Complete -  Palliative Care Screening Complete -  [place] Complete -  Some recent data might be hidden

## 2020-01-22 DIAGNOSIS — Z515 Encounter for palliative care: Secondary | ICD-10-CM

## 2020-01-22 DIAGNOSIS — K219 Gastro-esophageal reflux disease without esophagitis: Secondary | ICD-10-CM

## 2020-01-22 DIAGNOSIS — Z7189 Other specified counseling: Secondary | ICD-10-CM

## 2020-01-22 DIAGNOSIS — N390 Urinary tract infection, site not specified: Secondary | ICD-10-CM

## 2020-01-22 DIAGNOSIS — A09 Infectious gastroenteritis and colitis, unspecified: Secondary | ICD-10-CM

## 2020-01-22 LAB — COMPREHENSIVE METABOLIC PANEL
ALT: 11 U/L (ref 0–44)
AST: 15 U/L (ref 15–41)
Albumin: 2.1 g/dL — ABNORMAL LOW (ref 3.5–5.0)
Alkaline Phosphatase: 81 U/L (ref 38–126)
Anion gap: 6 (ref 5–15)
BUN: 66 mg/dL — ABNORMAL HIGH (ref 8–23)
CO2: 25 mmol/L (ref 22–32)
Calcium: 8.3 mg/dL — ABNORMAL LOW (ref 8.9–10.3)
Chloride: 109 mmol/L (ref 98–111)
Creatinine, Ser: 1.75 mg/dL — ABNORMAL HIGH (ref 0.44–1.00)
GFR calc Af Amer: 29 mL/min — ABNORMAL LOW (ref >60)
GFR calc non Af Amer: 25 mL/min — ABNORMAL LOW (ref >60)
Glucose, Bld: 92 mg/dL (ref 70–99)
Potassium: 4.3 mmol/L (ref 3.5–5.1)
Sodium: 140 mmol/L (ref 135–145)
Total Bilirubin: 0.3 mg/dL (ref 0.3–1.2)
Total Protein: 5.2 g/dL — ABNORMAL LOW (ref 6.5–8.1)

## 2020-01-22 LAB — URINE CULTURE: Culture: >=100000 — AB

## 2020-01-22 LAB — CBC WITH DIFFERENTIAL/PLATELET
Abs Immature Granulocytes: 0.06 10*3/uL (ref 0.00–0.07)
Basophils Absolute: 0 10*3/uL (ref 0.0–0.1)
Basophils Relative: 0 %
Eosinophils Absolute: 0.3 10*3/uL (ref 0.0–0.5)
Eosinophils Relative: 2 %
HCT: 33.4 % — ABNORMAL LOW (ref 36.0–46.0)
Hemoglobin: 10.5 g/dL — ABNORMAL LOW (ref 12.0–15.0)
Immature Granulocytes: 1 %
Lymphocytes Relative: 12 %
Lymphs Abs: 1.5 10*3/uL (ref 0.7–4.0)
MCH: 30.9 pg (ref 26.0–34.0)
MCHC: 31.4 g/dL (ref 30.0–36.0)
MCV: 98.2 fL (ref 80.0–100.0)
Monocytes Absolute: 1.4 10*3/uL — ABNORMAL HIGH (ref 0.1–1.0)
Monocytes Relative: 11 %
Neutro Abs: 9.3 10*3/uL — ABNORMAL HIGH (ref 1.7–7.7)
Neutrophils Relative %: 74 %
Platelets: 288 10*3/uL (ref 150–400)
RBC: 3.4 MIL/uL — ABNORMAL LOW (ref 3.87–5.11)
RDW: 14.9 % (ref 11.5–15.5)
WBC: 12.6 10*3/uL — ABNORMAL HIGH (ref 4.0–10.5)
nRBC: 0 % (ref 0.0–0.2)

## 2020-01-22 LAB — MAGNESIUM: Magnesium: 2.3 mg/dL (ref 1.7–2.4)

## 2020-01-22 LAB — GI PATHOGEN PANEL BY PCR, STOOL

## 2020-01-22 LAB — PHOSPHORUS: Phosphorus: 2.6 mg/dL (ref 2.5–4.6)

## 2020-01-22 MED ORDER — ORAL CARE MOUTH RINSE
15.0000 mL | Freq: Two times a day (BID) | OROMUCOSAL | Status: DC
  Administered 2020-01-22 – 2020-01-27 (×6): 15 mL via OROMUCOSAL

## 2020-01-22 NOTE — Progress Notes (Signed)
Patient ID: Krystal Mckenzie, female   DOB: 11-03-1926, 86 y.o.   MRN: [id number]         [facility]ious [facility]  Date of Admission:  01/19/2020           Day 3 oral vancomycin ASSESSMENT: She is improving on therapy for possible C. difficile colitis.  I recommend a total of 14 days of oral vancomycin therapy.  The 1+ blood culture is likely to represent a contaminant.  Her urine culture grew Pseudomonas but it was not a catheterized specimen.  I do not feel that this needs to be treated.  PLAN: 1. Continue oral vancomycin for 11 more days 2. I will sign off now  Principal Problem:   Sepsis ([place]) Active Problems:   Recurrent UTI   Diarrhea   Acute renal failure superimposed on chronic kidney disease (HCC)   Hypothyroid   Hypertension   Chronic diastolic CHF (congestive heart failure) ([place])   History of pulmonary embolism   History of COVID-19   CKD (chronic kidney disease), stage III   Macrocytic anemia   GERD (gastroesophageal reflux disease)   Bradycardia   Atherosclerotic peripheral vascular disease (HCC)   Scheduled Meds: . acidophilus  2 capsule Oral BID  . Chlorhexidine Gluconate Cloth  6 each Topical Q0600  . febuxostat  80 mg Oral Daily  . heparin  5,000 Units Subcutaneous Q8H  . HYDROcodone-acetaminophen  1 tablet Oral TID  . levothyroxine  75 mcg Oral Q0600  . LORazepam  0.5 mg Oral QPC lunch  . traZODone  50 mg Oral QHS  . vancomycin  125 mg Oral QID   Continuous Infusions: PRN Meds:.acetaminophen **OR** acetaminophen, acetaminophen, ondansetron **OR** ondansetron (ZOFRAN) IV   SUBJECTIVE: She is feeling better. Review of Systems: Review of Systems  Constitutional: Negative for fever.  Respiratory: Negative for cough.   Cardiovascular: Negative for chest pain.  Gastrointestinal: Positive for nausea. Negative for abdominal pain, diarrhea and vomiting.  Genitourinary: Negative for dysuria.  Musculoskeletal: Positive for joint pain.   Neurological: Negative for headaches.    Allergies  Allergen Reactions  . Morphine And Related   . Cephalexin Hives, Swelling and Rash  . Penicillins Swelling and Rash    Did it involve swelling of the face/tongue/throat, SOB, or low BP? Unknown Did it involve sudden or severe rash/hives, skin peeling, or any reaction on the inside of your mouth or nose? Unknown Did you need to seek medical attention at a hospital or doctor's office? Unknown When did it last happen? Unknown If all above answers are "NO", may proceed with cephalosporin use.     OBJECTIVE: Vitals:   01/22/20 0400 01/22/20 0606 01/22/20 0800 01/22/20 1200  BP: (!) 132/43 (!) 144/57    Pulse: 81 (!) 104    Resp: 16 18    Temp:   (!) 97.4 F (36.3 C) (!) 97.4 F (36.3 C)  TempSrc:   Oral Oral  SpO2: 94% 92%    Weight:      Height:       Body mass index is 24.45 kg/m.  Physical Exam Constitutional:      Comments: She is more alert and sitting up in bed.  Cardiovascular:     Rate and Rhythm: Normal rate and regular rhythm.     Heart sounds: No murmur.  Pulmonary:     Effort: Pulmonary effort is normal.     Breath sounds: Normal breath sounds.  Abdominal:     Palpations: Abdomen is  soft.     Tenderness: There is no abdominal tenderness.     Lab Results Lab Results  Component Value Date   WBC 12.6 (H) 01/22/2020   HGB 10.5 (L) 01/22/2020   HCT 33.4 (L) 01/22/2020   MCV 98.2 01/22/2020   PLT 288 01/22/2020    Lab Results  Component Value Date   CREATININE 1.75 (H) 01/22/2020   BUN 66 (H) 01/22/2020   NA 140 01/22/2020   K 4.3 01/22/2020   CL 109 01/22/2020   CO2 25 01/22/2020    Lab Results  Component Value Date   ALT 11 01/22/2020   AST 15 01/22/2020   ALKPHOS 81 01/22/2020   BILITOT 0.3 01/22/2020     Microbiology: Recent Results (from the past 240 hour(s))  Blood culture (routine x 2)     Status: None (Preliminary result)   Collection Time: 01/19/20  4:08 PM   Specimen: BLOOD  LEFT FOREARM  Result Value Ref Range Status   Specimen Description   Final    BLOOD LEFT FOREARM Performed at [facility], [place] [address], [place], [place] [address]    Special Requests   Final    BOTTLES DRAWN AEROBIC AND ANAEROBIC Blood Culture adequate volume Performed at [facility] [place] [address], [place], [place] [address]    Culture  Setup Time   Final    ANAEROBIC BOTTLE ONLY GRAM POSITIVE COCCI IN CLUSTERS CRITICAL RESULT CALLED TO, READ BACK BY AND VERIFIED WITH: Lavell Luster [facility] 01/21/20 0455 JDW    Culture   Final    GRAM POSITIVE COCCI IDENTIFICATION TO FOLLOW Performed at [facility], [place] [address] [place], [place] [address]    Report Status PENDING  Incomplete  Blood Culture ID Panel (Reflexed)     Status: None   Collection Time: 01/19/20  4:08 PM  Result Value Ref Range Status   Enterococcus species NOT DETECTED NOT DETECTED Final   Listeria monocytogenes NOT DETECTED NOT DETECTED Final   Staphylococcus species NOT DETECTED NOT DETECTED Final   Staphylococcus aureus (BCID) NOT DETECTED NOT DETECTED Final   Streptococcus species NOT DETECTED NOT DETECTED Final   Streptococcus agalactiae NOT DETECTED NOT DETECTED Final   Streptococcus pneumoniae NOT DETECTED NOT DETECTED Final   Streptococcus pyogenes NOT DETECTED NOT DETECTED Final   Acinetobacter baumannii NOT DETECTED NOT DETECTED Final   Enterobacteriaceae species NOT DETECTED NOT DETECTED Final   Enterobacter cloacae complex NOT DETECTED NOT DETECTED Final   Escherichia coli NOT DETECTED NOT DETECTED Final   Klebsiella oxytoca NOT DETECTED NOT DETECTED Final   Klebsiella pneumoniae NOT DETECTED NOT DETECTED Final   Proteus species NOT DETECTED NOT DETECTED Final   Serratia marcescens NOT DETECTED NOT DETECTED Final   Haemophilus influenzae NOT DETECTED NOT DETECTED Final   Neisseria meningitidis NOT DETECTED NOT DETECTED Final   Pseudomonas aeruginosa NOT  DETECTED NOT DETECTED Final   Candida albicans NOT DETECTED NOT DETECTED Final   Candida glabrata NOT DETECTED NOT DETECTED Final   Candida krusei NOT DETECTED NOT DETECTED Final   Candida parapsilosis NOT DETECTED NOT DETECTED Final   Candida tropicalis NOT DETECTED NOT DETECTED Final    Comment: Performed at [facility], [place] [address] [place], [place] [address]  SARS Coronavirus 2 by RT PCR (hospital order, performed in [facility] [place] hospital lab) Nasopharyngeal Nasopharyngeal Swab     Status: None   Collection Time: 01/19/20  4:23 PM   Specimen: Nasopharyngeal Swab  Result Value Ref Range Status   SARS Coronavirus 2 NEGATIVE NEGATIVE Final    Comment: (NOTE) SARS-CoV-2 target nucleic acids are NOT DETECTED. The SARS-CoV-2 RNA is generally detectable in upper and lower respiratory specimens during the acute phase of infection. The lowest concentration of SARS-CoV-2 viral copies this assay can detect is 250 copies / mL. A negative result does not preclude SARS-CoV-2 infection and should not be used as the sole basis for treatment or other patient management decisions.  A negative result may occur with improper specimen collection / handling, submission of specimen other than nasopharyngeal swab, presence of viral mutation(s) within the areas targeted by this assay, and inadequate number of viral copies (<250 copies / mL). A negative result must be combined with clinical observations, patient history, and epidemiological information. Fact Sheet for Patients:   StrictlyIdeas.no Fact Sheet for Healthcare Providers: BankingDealers.co.za This test is not yet approved or cleared  by the [place] FDA and has been authorized for detection and/or diagnosis of SARS-CoV-2 by FDA under an Emergency Use Authorization (EUA).  This EUA will remain in effect (meaning this test can be used) for the duration of the COVID-19 declaration under  Section 564(b)(1) of the Act, 21 U.S.C. section 360bbb-3(b)(1), unless the authorization is terminated or revoked sooner. Performed at [facility], [place] [address] [place], [place] [address]   Urine culture     Status: Abnormal   Collection Time: 01/19/20  6:03 PM   Specimen: Urine, Random  Result Value Ref Range Status   Specimen Description   Final    URINE, RANDOM Performed at [facility] [address], [place], [place] [address]    Special Requests   Final    NONE Performed at [facility], [place] [address] [place], [place] [address]    Culture >=100,000 COLONIES/mL PSEUDOMONAS AERUGINOSA (A)  Final   Report Status 01/22/2020 FINAL  Final   Organism ID, Bacteria PSEUDOMONAS AERUGINOSA (A)  Final      Susceptibility   Pseudomonas aeruginosa - MIC*    CEFTAZIDIME <=1 SENSITIVE Sensitive     CIPROFLOXACIN 1 SENSITIVE Sensitive     GENTAMICIN <=1 SENSITIVE Sensitive     IMIPENEM 1 SENSITIVE Sensitive     PIP/TAZO <=4 SENSITIVE Sensitive     CEFEPIME <=1 SENSITIVE Sensitive     * >=100,000 COLONIES/mL PSEUDOMONAS AERUGINOSA  C Difficile Quick Screen w PCR reflex     Status: Abnormal   Collection Time: 01/19/20  6:03 PM   Specimen: Stool  Result Value Ref Range Status   C Diff antigen POSITIVE (A) NEGATIVE Final   C Diff toxin NEGATIVE NEGATIVE Final   C Diff interpretation Results are indeterminate. See PCR results.  Final    Comment: Performed at [facility], [place] [address] [place], [place] [address]  C. Diff by PCR, Reflexed     Status: Abnormal   Collection Time: 01/19/20  6:03 PM  Result Value Ref Range Status   Toxigenic C. Difficile by PCR POSITIVE (A) NEGATIVE Final    Comment: Positive for toxigenic C. difficile with little to no toxin production. Only treat if clinical presentation suggests symptomatic illness. Performed at [facility], [place] [address], [place], [place] [address]    GI pathogen panel by PCR, stool     Status: None   Collection Time: 01/19/20  6:03 PM  Result Value Ref Range Status   Plesiomonas shigelloides NOT DETECTED NOT DETECTED Final  Yersinia enterocolitica NOT DETECTED NOT DETECTED Final   Vibrio NOT DETECTED NOT DETECTED Final   Enteropathogenic E coli NOT DETECTED NOT DETECTED Final   E coli (ETEC) LT/ST NOT DETECTED NOT DETECTED Final   E coli 4580 by PCR Not applicable NOT DETECTED Final   Cryptosporidium by PCR NOT DETECTED NOT DETECTED Final   Entamoeba histolytica NOT DETECTED NOT DETECTED Final   Adenovirus F 40/41 NOT DETECTED NOT DETECTED Final   Norovirus GI/GII NOT DETECTED NOT DETECTED Final   Sapovirus NOT DETECTED NOT DETECTED Final    Comment: (NOTE) A duplicate report has been generated due to demographic updates. Performed At: [facility] [place], [place] [id number] Rush Farmer MD NL:[id number]    Vibrio cholerae NOT DETECTED NOT DETECTED Final   Campylobacter by PCR NOT DETECTED NOT DETECTED Final   Salmonella by PCR NOT DETECTED NOT DETECTED Final   E coli (STEC) NOT DETECTED NOT DETECTED Final   Enteroaggregative E coli NOT DETECTED NOT DETECTED Final   Shigella by PCR NOT DETECTED NOT DETECTED Final   Cyclospora cayetanensis NOT DETECTED NOT DETECTED Final   Astrovirus NOT DETECTED NOT DETECTED Final   G lamblia by PCR NOT DETECTED NOT DETECTED Final   Rotavirus A by PCR NOT DETECTED NOT DETECTED Final  MRSA PCR Screening     Status: None   Collection Time: 01/19/20  9:45 PM   Specimen: Nasopharyngeal  Result Value Ref Range Status   MRSA by PCR NEGATIVE NEGATIVE Final    Comment:        The GeneXpert MRSA Assay (FDA approved for NASAL specimens only), is one component of a comprehensive MRSA colonization surveillance program. It is not intended to diagnose MRSA infection nor to guide or monitor treatment for MRSA infections. Performed at [facility],  [place] [address] [place], [place] [address]   Blood culture (routine x 2)     Status: None (Preliminary result)   Collection Time: 01/19/20 10:07 PM   Specimen: BLOOD LEFT ARM  Result Value Ref Range Status   Specimen Description   Final    BLOOD LEFT ARM Performed at [facility] [address], [place], [place] [address]    Special Requests   Final    BOTTLES DRAWN AEROBIC ONLY Blood Culture adequate volume Performed at [facility] [address], [place], [place] [address]    Culture   Final    NO GROWTH 3 DAYS Performed at [facility], [place] [address] [place], [place] [address]    Report Status PENDING  Incomplete    Michel Bickers, MD [facility] Infectious [place] [facility] [phone] pager   [phone] cell 01/22/2020, 12:13 PM

## 2020-01-22 NOTE — Progress Notes (Signed)
PROGRESS NOTE    Adelis Docter  DJT:[id number] DOB: 11/11/1926 DOA: 01/19/2020 PCP: Patient, No Pcp Per   Brief Narrative:  HPI per Dr. Mikki Harbor on 01/19/20  Almena Hokenson is a 86 y.o. female with HTN, hypothyroidism, GERD, DHF/LE swelling, CKD baseline Crt 1.6, chronic pain, gout and history of recurrent UTIs was sent from [facility] with concern for dehydration.  Apparently patient has been nauseous and facility got basic labs and KUB to evaluate.  Lab work indicated mild worsening of renal function with BUN of 95 and creatinine of 2.59 while KUB indicated possible ileus.  Patient extremely hard of hearing which is a major impediment to her history.  She reports history of nausea and loose stools but denies any vomiting.  Denies any abdominal pain.  Personal social history: Resident of [employer].  Reports is widowed with children.  Denies any smoking, drinking or illicit drug use.  **Interim History  Cardiology and infectious disease were consulted for further evaluation.  Cardiology ordered an echo and have signed off the case now.  Infectious disease recommending de-escalating antibiotics to just p.o. vancomycin.  He is going 1 out of 2 blood cultures of unidentified cocci that may be contaminant per ID they do not recommend restarting IV vancomycin they recommend continuing p.o. oral vancomycin for total 14 days for possible C. difficile colitis.  She has 11 more days remaining and ID feels that her urine and her blood cultures were contaminants.  Palliative care has been consulted for goals of care discussion.  Patient seems to be improving and was more alert today than she was when she first came in.  Assessment & Plan:   Principal Problem:   Sepsis ([place]) Active Problems:   Acute renal failure superimposed on chronic kidney disease ([place])   Recurrent UTI   Hypothyroid   Hypertension   Chronic diastolic CHF (congestive heart failure) (HCC)   History of  pulmonary embolism   History of COVID-19   CKD (chronic kidney disease), stage III   Diarrhea   Macrocytic anemia   GERD (gastroesophageal reflux disease)   Bradycardia   Atherosclerotic peripheral vascular disease (HCC)  Sepsis, poA and likely in the setting of C. difficile colitis -Question UTI  Vs colitis in the setting of C. Difficile; the Link Snuffer is that she has C. difficile colitis -Patient already received 2.5 L in the ED.  Will be careful with IV hydration given history of diastolic heart failure and IV fluid with sodium bicarbonate now stopped -She is positive for the antigen as well as PCR positive but negative for toxin -Meropenem/Flagyl (meropenem does cover anaerobes) initiated in the ED however this will be stopped per ID recommendations -Infectious diseases recommending continue with oral vancomycin for concern for C. difficile -ID consult  this a.m. and Dr. Megan Salon favors continue vancomycin for now stopping meropenem and metronidazole and does not feel that the urine specimen has been to be very helpful given the does not appear to be a catheterized specimen.  He feels that even though her C. difficile toxin is negative she is certainly at risk for C. difficile given her recent hospitalization and antibiotic exposures -Patient's procalcitonin level was 0.87 and is now 0.22 -WBC is elevated and went from 18.4 -> 19.5 and is now improving and is 12.6 -Blood cultures grew 1 out of 2 culture showing unidentified gram-positive cocci in clusters that may be a contaminant and infectious diseases does not recommend starting IV vancomycin again -Urine culture showed Pseudomonas  dialysis pansensitive however infectious disease feels that it was not a catheterized specimen and does not feel that it needs to be treated; she does have a Foley catheter in but will discontinue is now -Continue to Follow Cx's and Repeat CBC in the AM   AKI on CKD stage IIIa  Metabolic Acidosis  -BUN and  creatinine on admission was 95/2.75 and after fluid hydration she is improved to 66/1.75 -Patient has a Metabolic Acidosis with a  CO2 of 12, Chloride 117, and AG of 12; now this is improved with a CO2 of 21, and anion gap of 11 and a chloride level of 109 -Foley to be placed in ED. -Holding torsemide, Lactulose. -Avoid nephrotoxic medications, contrast dyes, hypotension and renally adjust medications  -Continue to monitor and trend renal function repeat CMP in a.m.  Sick sinus syndrome/Sinus Bradycardia -While on stepdown, pt had a sinus pause >3sec. -c/t hold BB.  -Consulted Cardiology and she was asymptomatic and this happened when she was asleep and Cardiology feels that it iwa sin the setting of Sepsis -Cardiology Recommending Avoiding AV nodal blocking agents and no further intervention   Chronic Diastolic CHF -BNP on admission was 155.9 -Echocardiogram was ordered by cardiology and is below -Chest x-ray did show small left pleural effusion -Patient is +1.483 L since admission -Currently her torsemide is being held and she is given IV fluids with sodium bicarbonate at 75 mL's per hour and this is now stopped today -Continue to monitor volume status carefully and she does not appear to be significantly volume overloaded on examination.  Presumed PE -Pt completed 3 months of Eliquis on 01/11/2020.  HTN -Holding hydralazine, Imdur, Toprol-XL for borderline BPs -Also holding torsemide may resume in the a.m. -Continue to monitor carefully  Hypokalemia -Patient's potassium was 3.0 and is now improved to 4.3 -Replete -Continue to monitor and replete as necessary -Repeat CMP none  Hypothyroidism -C/w Levothyroxine 75 mcg po Daily   GERD -Initially continued her PPI however with concern for C. difficile will discontinue at this time  Hypoalbuminemia  -2.6  Gout -C/w Febuxostat 80 mg po Daily   Normocytic anemia/anemia of chronic kidney disease -The patient's  hemoglobin/hematocrit went from 10.9/36.4 is now 10.5/23.4 -Check anemia panel in a.m.  -Continue to monitor for signs and symptoms of bleeding; currently no overt bleeding noted -Repeat CBC in a.m.  DVT prophylaxis: Heparin 5,000 units sq q8h Code Status: DO NOT RESUSCITATE  Family Communication: No family present at bedside  Disposition Plan: Pending further improvement and work-up; will need PT OT to further evaluate and treat they recommending SNF; have also called palliative care for goals of care discussion  Status is: Inpatient  Remains inpatient appropriate because:Unsafe d/c plan and IV treatments appropriate due to intensity of illness or inability to take PO; will need PT and OT to further evaluate and treat   Dispo: The patient is from: ALF              Anticipated d/c is to: SNF              Anticipated d/c date is: 2 days              Patient currently is not medically stable to d/c.  Consultants:   Infectious Diseases  Cardiology   Palliative care   Procedures:  ECHOCARDIOGRAM 1. Left ventricular ejection fraction, by estimation, is 60 to 65%. The  left ventricle has normal function. The left ventricle has no regional  wall  motion abnormalities. There is mild left ventricular hypertrophy.  Left ventricular diastolic parameters  are consistent with age-related delayed relaxation (normal).  2. Right ventricular systolic function is normal. The right ventricular  size is normal. There is mildly elevated pulmonary artery systolic  pressure.  3. Nodular calcification of the anterior leaflet tip. The mitral valve is  degenerative. Trivial mitral valve regurgitation. No evidence of mitral  stenosis.  4. The aortic valve is tricuspid. Aortic valve regurgitation is not  visualized. Mild aortic valve stenosis.  5. The inferior vena cava is dilated in size with >50% respiratory  variability, suggesting right atrial pressure of 8 mmHg.   FINDINGS  Left  Ventricle: Left ventricular ejection fraction, by estimation, is 60  to 65%. The left ventricle has normal function. The left ventricle has no  regional wall motion abnormalities. The left ventricular internal cavity  size was normal in size. There is  mild left ventricular hypertrophy. Left ventricular diastolic parameters  are consistent with age-related delayed relaxation (normal).   Right Ventricle: The right ventricular size is normal. No increase in  right ventricular wall thickness. Right ventricular systolic function is  normal. There is mildly elevated pulmonary artery systolic pressure. The  tricuspid regurgitant velocity is 2.76  m/s, and with an assumed right atrial pressure of 8 mmHg, the estimated  right ventricular systolic pressure is 71.0 mmHg.   Left Atrium: Left atrial size was normal in size.   Right Atrium: Right atrial size was normal in size.   Pericardium: There is no evidence of pericardial effusion.   Mitral Valve: Nodular calcification of the anterior leaflet tip. The  mitral valve is degenerative in appearance. There is moderate thickening  of the mitral valve leaflet(s). There is moderate calcification of the  mitral valve leaflet(s). Normal mobility  of the mitral valve leaflets. Moderate mitral annular calcification.  Trivial mitral valve regurgitation. No evidence of mitral valve stenosis.  MV peak gradient, 9.9 mmHg. The mean mitral valve gradient is 3.0 mmHg.   Tricuspid Valve: The tricuspid valve is normal in structure. Tricuspid  valve regurgitation is trivial. No evidence of tricuspid stenosis.   Aortic Valve: The aortic valve is tricuspid. . There is moderate  thickening and moderate calcification of the aortic valve. Aortic valve  regurgitation is not visualized. Mild aortic stenosis is present. There is  moderate thickening of the aortic valve.  There is moderate calcification of the aortic valve. Aortic valve mean  gradient measures 11.0  mmHg. Aortic valve peak gradient measures 22.2  mmHg. Aortic valve area, by VTI measures 1.23 cm.   Pulmonic Valve: The pulmonic valve was normal in structure. Pulmonic valve  regurgitation is not visualized. No evidence of pulmonic stenosis.   Aorta: The aortic root is normal in size and structure.   Venous: The inferior vena cava is dilated in size with greater than 50%  respiratory variability, suggesting right atrial pressure of 8 mmHg.   IAS/Shunts: No atrial level shunt detected by color flow Doppler.     LEFT VENTRICLE  PLAX 2D  LVIDd:     3.54 cm   Diastology  LVIDs:     1.95 cm   LV e' lateral:  7.02 cm/s  LV PW:     1.42 cm   LV E/e' lateral: 13.8  LV IVS:    1.16 cm   LV e' medial:  4.60 cm/s  LVOT diam:   1.70 cm   LV E/e' medial: 21.0  LV SV:  48  LV SV Index:  29  LVOT Area:   2.27 cm    LV Volumes (MOD)  LV vol d, MOD A2C: 76.7 ml  LV vol d, MOD A4C: 52.5 ml  LV vol s, MOD A2C: 22.4 ml  LV vol s, MOD A4C: 18.4 ml  LV SV MOD A2C:   54.3 ml  LV SV MOD A4C:   52.5 ml  LV SV MOD BP:   41.8 ml   RIGHT VENTRICLE       IVC  RV S prime:   20.80 cm/s IVC diam: 2.22 cm  TAPSE (M-mode): 2.3 cm   LEFT ATRIUM      Index    RIGHT ATRIUM      Index  LA diam:   3.45 cm 2.09 cm/m RA Area:   12.60 cm  LA Vol (A2C): 33.9 ml 20.53 ml/m RA Volume:  31.10 ml 18.83 ml/m  LA Vol (A4C): 36.1 ml 21.86 ml/m  AORTIC VALVE  AV Area (Vmax):  1.20 cm  AV Area (Vmean):  1.30 cm  AV Area (VTI):   1.23 cm  AV Vmax:      235.50 cm/s  AV Vmean:     152.500 cm/s  AV VTI:      0.392 m  AV Peak Grad:   22.2 mmHg  AV Mean Grad:   11.0 mmHg  LVOT Vmax:     125.00 cm/s  LVOT Vmean:    87.500 cm/s  LVOT VTI:     0.212 m  LVOT/AV VTI ratio: 0.54    AORTA  Ao Root diam: 2.90 cm   MITRAL VALVE        TRICUSPID VALVE  MV Area (PHT): 5.02 cm   TR  Peak grad:  30.5 mmHg  MV Peak grad: 9.9 mmHg   TR Vmax:    276.00 cm/s  MV Mean grad: 3.0 mmHg  MV Vmax:    1.57 m/s   SHUNTS  MV Vmean:   69.7 cm/s  Systemic VTI: 0.21 m  MV Decel Time: 151 msec   Systemic Diam: 1.70 cm  MV E velocity: 96.70 cm/s  MV A velocity: 158.00 cm/s  MV E/A ratio: 0.61   Antimicrobials:  Anti-infectives (From admission, onward)   Start     Dose/Rate Route Frequency Ordered Stop   01/20/20 1000  vancomycin (VANCOCIN) 50 mg/mL oral solution 125 mg     125 mg Oral 4 times daily 01/20/20 0741 01/30/20 0959   01/20/20 0600  meropenem (MERREM) 500 mg in sodium chloride 0.9 % 100 mL IVPB  Status:  Discontinued     500 mg 200 mL/hr over 30 Minutes Intravenous Every 12 hours 01/19/20 2104 01/20/20 1519   01/20/20 0200  meropenem (MERREM) 1 g in sodium chloride 0.9 % 100 mL IVPB  Status:  Discontinued     1 g 200 mL/hr over 30 Minutes Intravenous Every 8 hours 01/19/20 2002 01/19/20 2104   01/20/20 0200  metroNIDAZOLE (FLAGYL) IVPB 500 mg  Status:  Discontinued     500 mg 100 mL/hr over 60 Minutes Intravenous Every 8 hours 01/19/20 2002 01/20/20 1519   01/19/20 1800  metroNIDAZOLE (FLAGYL) IVPB 500 mg     500 mg 100 mL/hr over 60 Minutes Intravenous  Once 01/19/20 1753 01/19/20 1921   01/19/20 1700  meropenem (MERREM) 1 g in sodium chloride 0.9 % 100 mL IVPB     1 g 200 mL/hr over 30 Minutes Intravenous  Once 01/19/20 1634 01/19/20  1808     Subjective: Seen and examined at bedside and she is extremely hard of hearing.  She states that she is feeling better today and actually got some sleep.  She denies any specific complaints but states her legs do hurt quite a bit.  No nausea or vomiting.  No other concerns or plans at this time.  Objective: Vitals:   01/22/20 1200 01/22/20 1400 01/22/20 1500 01/22/20 1600  BP: (!) 145/61 (!) 127/45  138/61  Pulse: 97 98 (!) 107 (!) 105  Resp: (!) 24 (!) 25 (!) 26 19  Temp: (!) 97.4 F (36.3 C)   97.8  F (36.6 C)  TempSrc: Oral   Oral  SpO2: 93% 95% 97% 98%  Weight:      Height:        Intake/Output Summary (Last 24 hours) at 01/22/2020 1832 Last data filed at 01/22/2020 1209 Gross per 24 hour  Intake 390 ml  Output 526 ml  Net -136 ml   Filed Weights   01/19/20 2139  Weight: 62.6 kg   Examination: Physical Exam:  Constitutional: Patient is a thin elderly Caucasian female in NAD and appears calm and comfortable Eyes: Lids and conjunctivae normal, sclerae anicteric  ENMT: External Ears, Nose appear normal. Hard of hearing.   Neck: Appears normal, supple, no cervical masses, normal ROM, no appreciable thyromegaly Respiratory: Diminished to auscultation bilaterally, no wheezing, rales, rhonchi or crackles. Normal respiratory effort and patient is not tachypenic. No accessory muscle use. Unlabored breathing  Cardiovascular: RRR, has a 2/6 Systolic Murmur. S1 and S2 auscultated.  Abdomen: Soft, non-tender, non-distended. Bowel sounds positive.  GU: Deferred. Musculoskeletal: No clubbing / cyanosis of digits/nails. No joint deformity upper and lower extremities but has painful palpation in the Lower Extremities  Skin: No rashes, lesions, ulcers. No induration; Warm and dry.  Neurologic: CN 2-12 grossly intact with no focal deficits. Romberg sign and cerebellar reflexes not assessed.  Psychiatric: Normal judgment and insight. Alert and oriented x 3. Normal mood and appropriate affect.   Data Reviewed: I have personally reviewed following labs and imaging studies  CBC: Recent Labs  Lab 01/19/20 1454 01/20/20 0212 01/21/20 0259 01/22/20 0239  WBC 18.4* 19.5* 12.7* 12.6*  NEUTROABS 15.2* 17.0* 9.9* 9.3*  HGB 12.2 10.9* 10.1* 10.5*  HCT 39.7 36.4 31.8* 33.4*  MCV 100.5* 104.6* 98.1 98.2  PLT 303 297 290 741   Basic Metabolic Panel: Recent Labs  Lab 01/19/20 1454 01/20/20 0212 01/21/20 0259 01/22/20 0239  NA 137 141 141 140  K 4.3 3.8 3.0* 4.3  CL 108 117* 109 109    CO2 17* 12* 21* 25  GLUCOSE 86 65* 111* 92  BUN 95* 83* 70* 66*  CREATININE 2.75* 2.29* 2.03* 1.75*  CALCIUM 9.1 8.6* 8.3* 8.3*  MG  --   --  1.7 2.3  PHOS  --   --  3.3 2.6   GFR: Estimated Creatinine Clearance: 17.3 mL/min (A) (by C-G formula based on SCr of 1.75 mg/dL (H)). Liver Function Tests: Recent Labs  Lab 01/19/20 1454 01/20/20 0212 01/21/20 0259 01/22/20 0239  AST 17 18 15 15   ALT 13 11 10 11   ALKPHOS 97 82 74 81  BILITOT 0.4 0.4 0.3 0.3  PROT 6.6 5.5* 5.2* 5.2*  ALBUMIN 2.6* 2.2* 2.1* 2.1*   Recent Labs  Lab 01/19/20 1454  LIPASE 50   No results for input(s): AMMONIA in the last 168 hours. Coagulation Profile: No results for input(s): INR, PROTIME in  the last 168 hours. Cardiac Enzymes: No results for input(s): CKTOTAL, CKMB, CKMBINDEX, TROPONINI in the last 168 hours. BNP (last 3 results) No results for input(s): PROBNP in the last 8760 hours. HbA1C: No results for input(s): HGBA1C in the last 72 hours. CBG: No results for input(s): GLUCAP in the last 168 hours. Lipid Profile: No results for input(s): CHOL, HDL, LDLCALC, TRIG, CHOLHDL, LDLDIRECT in the last 72 hours. Thyroid Function Tests: No results for input(s): TSH, T4TOTAL, FREET4, T3FREE, THYROIDAB in the last 72 hours. Anemia Panel: No results for input(s): VITAMINB12, FOLATE, FERRITIN, TIBC, IRON, RETICCTPCT in the last 72 hours. Sepsis Labs: Recent Labs  Lab 01/19/20 1454 01/19/20 1608 01/19/20 1825 01/20/20 0212 01/21/20 0259  PROCALCITON 0.87  --   --  0.20 0.22  LATICACIDVEN  --  0.8 0.6  --   --     Recent Results (from the past 240 hour(s))  Blood culture (routine x 2)     Status: None (Preliminary result)   Collection Time: 01/19/20  4:08 PM   Specimen: BLOOD LEFT FOREARM  Result Value Ref Range Status   Specimen Description   Final    BLOOD LEFT FOREARM Performed at [facility] [address] [place], [place] [address]    Special Requests   Final     BOTTLES DRAWN AEROBIC AND ANAEROBIC Blood Culture adequate volume Performed at [facility] [address], [place], [place] [address]    Culture  Setup Time   Final    ANAEROBIC BOTTLE ONLY GRAM POSITIVE COCCI IN CLUSTERS CRITICAL RESULT CALLED TO, READ BACK BY AND VERIFIED WITH: Lavell Luster [facility] 01/21/20 0455 JDW    Culture   Final    GRAM POSITIVE COCCI IDENTIFICATION TO FOLLOW Performed at [facility], [place] [address] [place], [place] [address]    Report Status PENDING  Incomplete  Blood Culture ID Panel (Reflexed)     Status: None   Collection Time: 01/19/20  4:08 PM  Result Value Ref Range Status   Enterococcus species NOT DETECTED NOT DETECTED Final   Listeria monocytogenes NOT DETECTED NOT DETECTED Final   Staphylococcus species NOT DETECTED NOT DETECTED Final   Staphylococcus aureus (BCID) NOT DETECTED NOT DETECTED Final   Streptococcus species NOT DETECTED NOT DETECTED Final   Streptococcus agalactiae NOT DETECTED NOT DETECTED Final   Streptococcus pneumoniae NOT DETECTED NOT DETECTED Final   Streptococcus pyogenes NOT DETECTED NOT DETECTED Final   Acinetobacter baumannii NOT DETECTED NOT DETECTED Final   Enterobacteriaceae species NOT DETECTED NOT DETECTED Final   Enterobacter cloacae complex NOT DETECTED NOT DETECTED Final   Escherichia coli NOT DETECTED NOT DETECTED Final   Klebsiella oxytoca NOT DETECTED NOT DETECTED Final   Klebsiella pneumoniae NOT DETECTED NOT DETECTED Final   Proteus species NOT DETECTED NOT DETECTED Final   Serratia marcescens NOT DETECTED NOT DETECTED Final   Haemophilus influenzae NOT DETECTED NOT DETECTED Final   Neisseria meningitidis NOT DETECTED NOT DETECTED Final   Pseudomonas aeruginosa NOT DETECTED NOT DETECTED Final   Candida albicans NOT DETECTED NOT DETECTED Final   Candida glabrata NOT DETECTED NOT DETECTED Final   Candida krusei NOT DETECTED NOT DETECTED Final   Candida parapsilosis NOT DETECTED NOT  DETECTED Final   Candida tropicalis NOT DETECTED NOT DETECTED Final    Comment: Performed at [facility], [place] [address] [place], [place] [address]  SARS Coronavirus 2 by RT PCR (hospital order, performed in [facility] hospital  lab) Nasopharyngeal Nasopharyngeal Swab     Status: None   Collection Time: 01/19/20  4:23 PM   Specimen: Nasopharyngeal Swab  Result Value Ref Range Status   SARS Coronavirus 2 NEGATIVE NEGATIVE Final    Comment: (NOTE) SARS-CoV-2 target nucleic acids are NOT DETECTED. The SARS-CoV-2 RNA is generally detectable in upper and lower respiratory specimens during the acute phase of infection. The lowest concentration of SARS-CoV-2 viral copies this assay can detect is 250 copies / mL. A negative result does not preclude SARS-CoV-2 infection and should not be used as the sole basis for treatment or other patient management decisions.  A negative result may occur with improper specimen collection / handling, submission of specimen other than nasopharyngeal swab, presence of viral mutation(s) within the areas targeted by this assay, and inadequate number of viral copies (<250 copies / mL). A negative result must be combined with clinical observations, patient history, and epidemiological information. Fact Sheet for Patients:   StrictlyIdeas.no Fact Sheet for Healthcare Providers: BankingDealers.co.za This test is not yet approved or cleared  by the [place] FDA and has been authorized for detection and/or diagnosis of SARS-CoV-2 by FDA under an Emergency Use Authorization (EUA).  This EUA will remain in effect (meaning this test can be used) for the duration of the COVID-19 declaration under Section 564(b)(1) of the Act, 21 U.S.C. section 360bbb-3(b)(1), unless the authorization is terminated or revoked sooner. Performed at [facility], [place] [address], [place], [place] [address]   Urine  culture     Status: Abnormal   Collection Time: 01/19/20  6:03 PM   Specimen: Urine, Random  Result Value Ref Range Status   Specimen Description   Final    URINE, RANDOM Performed at [facility] [address], [place], [place] [address]    Special Requests   Final    NONE Performed at [facility], [place] [address], [place], [place] [address]    Culture >=100,000 COLONIES/mL PSEUDOMONAS AERUGINOSA (A)  Final   Report Status 01/22/2020 FINAL  Final   Organism ID, Bacteria PSEUDOMONAS AERUGINOSA (A)  Final      Susceptibility   Pseudomonas aeruginosa - MIC*    CEFTAZIDIME <=1 SENSITIVE Sensitive     CIPROFLOXACIN 1 SENSITIVE Sensitive     GENTAMICIN <=1 SENSITIVE Sensitive     IMIPENEM 1 SENSITIVE Sensitive     PIP/TAZO <=4 SENSITIVE Sensitive     CEFEPIME <=1 SENSITIVE Sensitive     * >=100,000 COLONIES/mL PSEUDOMONAS AERUGINOSA  C Difficile Quick Screen w PCR reflex     Status: Abnormal   Collection Time: 01/19/20  6:03 PM   Specimen: Stool  Result Value Ref Range Status   C Diff antigen POSITIVE (A) NEGATIVE Final   C Diff toxin NEGATIVE NEGATIVE Final   C Diff interpretation Results are indeterminate. See PCR results.  Final    Comment: Performed at [facility], [place] [address], [place], [place] [address]  C. Diff by PCR, Reflexed     Status: Abnormal   Collection Time: 01/19/20  6:03 PM  Result Value Ref Range Status   Toxigenic C. Difficile by PCR POSITIVE (A) NEGATIVE Final    Comment: Positive for toxigenic C. difficile with little to no toxin production. Only treat if clinical presentation suggests symptomatic illness. Performed at [facility], [place] [address] [place], [place] [address]   GI pathogen panel by PCR, stool     Status: None  Collection Time: 01/19/20  6:03 PM  Result Value Ref Range Status   Plesiomonas shigelloides NOT DETECTED NOT DETECTED Final   Yersinia enterocolitica NOT DETECTED  NOT DETECTED Final   Vibrio NOT DETECTED NOT DETECTED Final   Enteropathogenic E coli NOT DETECTED NOT DETECTED Final   E coli (ETEC) LT/ST NOT DETECTED NOT DETECTED Final   E coli 7510 by PCR Not applicable NOT DETECTED Final   Cryptosporidium by PCR NOT DETECTED NOT DETECTED Final   Entamoeba histolytica NOT DETECTED NOT DETECTED Final   Adenovirus F 40/41 NOT DETECTED NOT DETECTED Final   Norovirus GI/GII NOT DETECTED NOT DETECTED Final   Sapovirus NOT DETECTED NOT DETECTED Final    Comment: (NOTE) A duplicate report has been generated due to demographic updates. Performed At: [facility] [place], [place] [id number] Rush Farmer MD [id number]    Vibrio cholerae NOT DETECTED NOT DETECTED Final   Campylobacter by PCR NOT DETECTED NOT DETECTED Final   Salmonella by PCR NOT DETECTED NOT DETECTED Final   E coli (STEC) NOT DETECTED NOT DETECTED Final   Enteroaggregative E coli NOT DETECTED NOT DETECTED Final   Shigella by PCR NOT DETECTED NOT DETECTED Final   Cyclospora cayetanensis NOT DETECTED NOT DETECTED Final   Astrovirus NOT DETECTED NOT DETECTED Final   G lamblia by PCR NOT DETECTED NOT DETECTED Final   Rotavirus A by PCR NOT DETECTED NOT DETECTED Final  MRSA PCR Screening     Status: None   Collection Time: 01/19/20  9:45 PM   Specimen: Nasopharyngeal  Result Value Ref Range Status   MRSA by PCR NEGATIVE NEGATIVE Final    Comment:        The GeneXpert MRSA Assay (FDA approved for NASAL specimens only), is one component of a comprehensive MRSA colonization surveillance program. It is not intended to diagnose MRSA infection nor to guide or monitor treatment for MRSA infections. Performed at [facility], [place] [address], [place], [place] [address]   Blood culture (routine x 2)     Status: None (Preliminary result)   Collection Time: 01/19/20 10:07 PM   Specimen: BLOOD LEFT ARM  Result Value Ref Range Status   Specimen  Description   Final    BLOOD LEFT ARM Performed at [facility] [address] [place], [place] [address]    Special Requests   Final    BOTTLES DRAWN AEROBIC ONLY Blood Culture adequate volume Performed at [facility] [address], [place], [place] [address]    Culture   Final    NO GROWTH 3 DAYS Performed at [facility], [place] [address] [place],  [address]    Report Status PENDING  Incomplete     RN Pressure Injury Documentation: Pressure Injury 09/21/19 Buttocks Left Stage 2 -  Partial thickness loss of dermis presenting as a shallow open injury with a red, pink wound bed without slough. (Active)  09/21/19 1600  Location: Buttocks  Location Orientation: Left  Staging: Stage 2 -  Partial thickness loss of dermis presenting as a shallow open injury with a red, pink wound bed without slough.  Wound Description (Comments):   Present on Admission: Yes     Pressure Injury 09/21/19 Buttocks Right Stage 1 -  Intact skin with non-blanchable redness of a localized area usually over a bony prominence. (Active)  09/21/19 1600  Location: Buttocks  Location Orientation: Right  Staging: Stage 1 -  Intact skin  with non-blanchable redness of a localized area usually over a bony prominence.  Wound Description (Comments):   Present on Admission: Yes     Radiology Studies: DG CHEST PORT 1 VIEW  Result Date: 01/21/2020 CLINICAL DATA:  Shortness of breath. EXAM: PORTABLE CHEST 1 VIEW COMPARISON:  01/19/2020 FINDINGS: Patient slightly rotated to the left as patient's head/chin obscures a portion of the lung apices. Lungs are adequately inflated with persistent left base opacification likely small effusion with atelectasis. Subtle prominence of the perihilar markings suggesting a mild degree of vascular congestion. Cardiomediastinal silhouette and remainder of the exam is unchanged. IMPRESSION: Stable mild left base opacification likely small  effusion with atelectasis. Possible component of mild vascular congestion. Electronically Signed   By: Marin Olp M.D.   On: 01/21/2020 08:02   Scheduled Meds: . acidophilus  2 capsule Oral BID  . Chlorhexidine Gluconate Cloth  6 each Topical Q0600  . febuxostat  80 mg Oral Daily  . heparin  5,000 Units Subcutaneous Q8H  . HYDROcodone-acetaminophen  1 tablet Oral TID  . levothyroxine  75 mcg Oral Q0600  . LORazepam  0.5 mg Oral QPC lunch  . mouth rinse  15 mL Mouth Rinse BID  . traZODone  50 mg Oral QHS  . vancomycin  125 mg Oral QID   Continuous Infusions:   LOS: 3 days   Kerney Elbe, DO [facility] PAGER is on [place]  If 7PM-7AM, please contact night-coverage [email or web address]

## 2020-01-23 LAB — CBC WITH DIFFERENTIAL/PLATELET
Abs Immature Granulocytes: 0.09 10*3/uL — ABNORMAL HIGH (ref 0.00–0.07)
Basophils Absolute: 0 10*3/uL (ref 0.0–0.1)
Basophils Relative: 0 %
Eosinophils Absolute: 0.4 10*3/uL (ref 0.0–0.5)
Eosinophils Relative: 3 %
HCT: 33.6 % — ABNORMAL LOW (ref 36.0–46.0)
Hemoglobin: 10.4 g/dL — ABNORMAL LOW (ref 12.0–15.0)
Immature Granulocytes: 1 %
Lymphocytes Relative: 19 %
Lymphs Abs: 2.7 10*3/uL (ref 0.7–4.0)
MCH: 31 pg (ref 26.0–34.0)
MCHC: 31 g/dL (ref 30.0–36.0)
MCV: 100.3 fL — ABNORMAL HIGH (ref 80.0–100.0)
Monocytes Absolute: 1.2 10*3/uL — ABNORMAL HIGH (ref 0.1–1.0)
Monocytes Relative: 9 %
Neutro Abs: 9.6 10*3/uL — ABNORMAL HIGH (ref 1.7–7.7)
Neutrophils Relative %: 68 %
Platelets: 245 10*3/uL (ref 150–400)
RBC: 3.35 MIL/uL — ABNORMAL LOW (ref 3.87–5.11)
RDW: 14.8 % (ref 11.5–15.5)
WBC: 14 10*3/uL — ABNORMAL HIGH (ref 4.0–10.5)
nRBC: 0 % (ref 0.0–0.2)

## 2020-01-23 LAB — COMPREHENSIVE METABOLIC PANEL
ALT: 13 U/L (ref 0–44)
AST: 16 U/L (ref 15–41)
Albumin: 2.2 g/dL — ABNORMAL LOW (ref 3.5–5.0)
Alkaline Phosphatase: 95 U/L (ref 38–126)
Anion gap: 5 (ref 5–15)
BUN: 55 mg/dL — ABNORMAL HIGH (ref 8–23)
CO2: 25 mmol/L (ref 22–32)
Calcium: 8.2 mg/dL — ABNORMAL LOW (ref 8.9–10.3)
Chloride: 105 mmol/L (ref 98–111)
Creatinine, Ser: 1.58 mg/dL — ABNORMAL HIGH (ref 0.44–1.00)
GFR calc Af Amer: 33 mL/min — ABNORMAL LOW (ref >60)
GFR calc non Af Amer: 28 mL/min — ABNORMAL LOW (ref >60)
Glucose, Bld: 75 mg/dL (ref 70–99)
Potassium: 4.3 mmol/L (ref 3.5–5.1)
Sodium: 135 mmol/L (ref 135–145)
Total Bilirubin: 0.2 mg/dL — ABNORMAL LOW (ref 0.3–1.2)
Total Protein: 5.4 g/dL — ABNORMAL LOW (ref 6.5–8.1)

## 2020-01-23 LAB — CULTURE, BLOOD (ROUTINE X 2): Special Requests: ADEQUATE

## 2020-01-23 LAB — PHOSPHORUS: Phosphorus: 2.6 mg/dL (ref 2.5–4.6)

## 2020-01-23 LAB — GLUCOSE, CAPILLARY
Glucose-Capillary: 87 mg/dL (ref 70–99)
Glucose-Capillary: 92 mg/dL (ref 70–99)

## 2020-01-23 LAB — MAGNESIUM: Magnesium: 2.2 mg/dL (ref 1.7–2.4)

## 2020-01-23 MED ORDER — DEXTROSE-NACL 5-0.45 % IV SOLN: INTRAVENOUS | Status: AC

## 2020-01-23 MED ORDER — GERHARDT'S BUTT CREAM
TOPICAL_CREAM | CUTANEOUS | Status: DC | PRN
  Administered 2020-01-24 – 2020-01-26 (×2): 1 via TOPICAL
  Filled 2020-01-23: qty 1

## 2020-01-23 NOTE — Progress Notes (Signed)
PROGRESS NOTE    Krystal Mckenzie  FBP:[id number] DOB: 1927/05/26 DOA: 01/19/2020 PCP: Patient, No Pcp Per   Brief Narrative:  HPI per Dr. Mikki Harbor on 01/19/20  Krystal Mckenzie is a 86 y.o. female with HTN, hypothyroidism, GERD, DHF/LE swelling, CKD baseline Crt 1.6, chronic pain, gout and history of recurrent UTIs was sent from [facility] with concern for dehydration.  Apparently patient has been nauseous and facility got basic labs and KUB to evaluate.  Lab work indicated mild worsening of renal function with BUN of 95 and creatinine of 2.59 while KUB indicated possible ileus.  Patient extremely hard of hearing which is a major impediment to her history.  She reports history of nausea and loose stools but denies any vomiting.  Denies any abdominal pain.  Personal social history: Resident of [employer].  Reports is widowed with children.  Denies any smoking, drinking or illicit drug use.  **Interim History  Cardiology and infectious disease were consulted for further evaluation.  Cardiology ordered an echo and have signed off the case now.  Infectious disease recommending de-escalating antibiotics to just p.o. vancomycin. She had 1 out of 2 blood cultures of unidentified cocci that may be contaminant per ID they do not recommend restarting IV vancomycin they recommend continuing p.o. oral vancomycin for total 14 days for possible C. difficile colitis.  She has 10 more days remaining and ID feels that her urine and her blood cultures were contaminants.  Palliative care has been consulted for goals of care discussion and she remains a DNR/DNI but after discussion  Patient feels like she benefits from hospitalization done palliative care is now recommending outpatient palliative care team to follow-up when she returns her facility.  Patient seems to be improving and was more alert today than she was when she first came in.  Continues to have significant amount of diarrhea so a  Flexi-Seal is been placed and we are going to have Krystal Mckenzie's Butt cream for her given her continued diarrhea.  She is not taking an oral intake as much so we will restart IV fluid hydration low-dose with D5 half-normal saline and monitor CBG every 4 and consult nutrition for dietary assessment.   Assessment & Plan:   Principal Problem:   Sepsis ([place]) Active Problems:   Acute renal failure superimposed on chronic kidney disease ([place])   Recurrent UTI   Hypothyroid   Hypertension   Chronic diastolic CHF (congestive heart failure) (HCC)   History of pulmonary embolism   History of COVID-19   CKD (chronic kidney disease), stage III   Diarrhea   Macrocytic anemia   GERD (gastroesophageal reflux disease)   Bradycardia   Atherosclerotic peripheral vascular disease (HCC)  Sepsis, poA and likely in the setting of C. difficile colitis -Question UTI  Vs colitis in the setting of C. Difficile; the Link Snuffer is that she has C. difficile colitis and she is now having Bowel Movements  -Patient already received 2.5 L in the ED.  Will be careful with IV hydration given history of diastolic heart failure and IV fluid with sodium bicarbonate now stopped but she is having poor oral intake so will resume low dose fluid with D5 1/2 NS at 75 mL/hr for overnight and for 16 hours  -Sepsis physiology is improving somewhat -She is positive for the antigen as well as PCR positive but negative for toxin -Meropenem/Flagyl (meropenem does cover anaerobes) initiated in the ED however this will be stopped per ID recommendations -Infectious diseases recommending continue  with oral vancomycin for concern for C. difficile -ID consult  this a.m. and Dr. Megan Salon favors continue vancomycin for now stopping meropenem and metronidazole and does not feel that the urine specimen has been to be very helpful given the does not appear to be a catheterized specimen.  He feels that even though her C. difficile toxin is negative she is  certainly at risk for C. difficile given her recent hospitalization and antibiotic exposures -Patient's procalcitonin level was 0.87 and is now 0.22 -WBC is elevated and went from 18.4 -> 19.5 and is now improving and is 12.6 yesterday but slightly worsened to 14.0 -Continues have significant amount of diarrhea so we will place a Flexi-Seal and order Butt cream; nursing states she is not eating well as she should so will reinitiate fluid short term -Blood cultures grew 1 out of 2 culture showing unidentified gram-positive cocci in clusters that may be a contaminant and infectious diseases does not recommend starting IV vancomycin again -Urine culture showed Pseudomonas dialysis pansensitive however infectious disease feels that it was not a catheterized specimen and does not feel that it needs to be treated; she does have a Foley catheter in but will discontinue is now -Continue to Follow Cx's and Repeat CBC in the AM   AKI on CKD stage IIIa, improving Metabolic Acidosis  -BUN and creatinine on admission was 95/2.75 and after fluid hydration she is improved to 55/1.58 -Patient has a Metabolic Acidosis with a  CO2 of 12, Chloride 117, and AG of 12; now this is improved with a CO2 of 25, anion gap of five, chloride level of 105 -Foley to be placed in ED. -Holding torsemide, Lactulose. -Resumed IVF as above -Avoid nephrotoxic medications, contrast dyes, hypotension and renally adjust medications  -Continue to monitor and trend renal function repeat CMP in a.m.  Sick sinus syndrome/Sinus Bradycardia -While on stepdown, pt had a sinus pause >3sec. she is now improved and stable for transfer to the medical floor -c/t hold BB.  -Consulted Cardiology and she was asymptomatic and this happened when she was asleep and Cardiology feels that it iwa sin the setting of Sepsis -Cardiology Recommending Avoiding AV nodal blocking agents and no further intervention   Chronic Diastolic CHF -BNP on admission  was 155.9 -Echocardiogram was ordered by cardiology and is below -Chest x-ray did show small left pleural effusion -Patient is + 1.848 L since admission -Currently her torsemide is being held and she is given IV fluids as above -Continue to monitor volume status carefully and she does not appear to be significantly volume overloaded on examination. -Resuming torsemide in the a.m. if diarrhea is improving  Presumed PE -Pt completed 3 months of Eliquis on 01/11/2020.  HTN -Holding hydralazine, Imdur, Toprol-XL for borderline BPs -Also holding torsemide may resume in the a.m. -Continue to monitor carefully  Hypokalemia -Patient's potassium was 3.0 and is now improved to 4.3 -Continue to monitor and replete as necessary -Repeat CMP none  Hypothyroidism -C/w Levothyroxine 75 mcg po Daily   GERD -Initially continued her PPI however with concern for C. difficile will discontinue at this time  Hypoalbuminemia and poor po Intake -Her albumin level is now 2.2 -Check CBGs q4h -We will consult nutrition for further evaluation recommendations -IVF Hydration as above  Gout -C/w Febuxostat 80 mg po Daily   Normocytic anemia/anemia of chronic kidney disease -The patient's hemoglobin/hematocrit went from 10.9/36.4 is now 10.4/33.6 -Check anemia panel in a.m.  -Continue to monitor for signs and symptoms of  bleeding; currently no overt bleeding noted -Repeat CBC in a.m.  DVT prophylaxis: Heparin 5,000 units sq q8h Code Status: DO NOT RESUSCITATE  Family Communication: No family present at bedside  Disposition Plan: Pending further improvement and work-up of her colitis.  Her diarrhea is still pretty significant so we will continue monitor while she is in the hospital; will need PT OT to further evaluate and they have signed off as she is essentially total care. Have also called palliative care for goals of care discussion and she continues to wish to get rehospitalized if  necessary  Status is: Inpatient  Remains inpatient appropriate because:Unsafe d/c plan and IV treatments appropriate due to intensity of illness or inability to take PO; will need PT and OT to further evaluate and treat   Dispo: The patient is from: ALF              Anticipated d/c is to: SNF              Anticipated d/c date is: 2 days              Patient currently is not medically stable to d/c.  Consultants:   Infectious Diseases  Cardiology   Palliative Care   Procedures:  ECHOCARDIOGRAM 1. Left ventricular ejection fraction, by estimation, is 60 to 65%. The  left ventricle has normal function. The left ventricle has no regional  wall motion abnormalities. There is mild left ventricular hypertrophy.  Left ventricular diastolic parameters  are consistent with age-related delayed relaxation (normal).  2. Right ventricular systolic function is normal. The right ventricular  size is normal. There is mildly elevated pulmonary artery systolic  pressure.  3. Nodular calcification of the anterior leaflet tip. The mitral valve is  degenerative. Trivial mitral valve regurgitation. No evidence of mitral  stenosis.  4. The aortic valve is tricuspid. Aortic valve regurgitation is not  visualized. Mild aortic valve stenosis.  5. The inferior vena cava is dilated in size with >50% respiratory  variability, suggesting right atrial pressure of 8 mmHg.   FINDINGS  Left Ventricle: Left ventricular ejection fraction, by estimation, is 60  to 65%. The left ventricle has normal function. The left ventricle has no  regional wall motion abnormalities. The left ventricular internal cavity  size was normal in size. There is  mild left ventricular hypertrophy. Left ventricular diastolic parameters  are consistent with age-related delayed relaxation (normal).   Right Ventricle: The right ventricular size is normal. No increase in  right ventricular wall thickness. Right ventricular  systolic function is  normal. There is mildly elevated pulmonary artery systolic pressure. The  tricuspid regurgitant velocity is 2.76  m/s, and with an assumed right atrial pressure of 8 mmHg, the estimated  right ventricular systolic pressure is 81.0 mmHg.   Left Atrium: Left atrial size was normal in size.   Right Atrium: Right atrial size was normal in size.   Pericardium: There is no evidence of pericardial effusion.   Mitral Valve: Nodular calcification of the anterior leaflet tip. The  mitral valve is degenerative in appearance. There is moderate thickening  of the mitral valve leaflet(s). There is moderate calcification of the  mitral valve leaflet(s). Normal mobility  of the mitral valve leaflets. Moderate mitral annular calcification.  Trivial mitral valve regurgitation. No evidence of mitral valve stenosis.  MV peak gradient, 9.9 mmHg. The mean mitral valve gradient is 3.0 mmHg.   Tricuspid Valve: The tricuspid valve is normal in structure. Tricuspid  valve regurgitation is trivial. No evidence of tricuspid stenosis.   Aortic Valve: The aortic valve is tricuspid. . There is moderate  thickening and moderate calcification of the aortic valve. Aortic valve  regurgitation is not visualized. Mild aortic stenosis is present. There is  moderate thickening of the aortic valve.  There is moderate calcification of the aortic valve. Aortic valve mean  gradient measures 11.0 mmHg. Aortic valve peak gradient measures 22.2  mmHg. Aortic valve area, by VTI measures 1.23 cm.   Pulmonic Valve: The pulmonic valve was normal in structure. Pulmonic valve  regurgitation is not visualized. No evidence of pulmonic stenosis.   Aorta: The aortic root is normal in size and structure.   Venous: The inferior vena cava is dilated in size with greater than 50%  respiratory variability, suggesting right atrial pressure of 8 mmHg.   IAS/Shunts: No atrial level shunt detected by color flow  Doppler.     LEFT VENTRICLE  PLAX 2D  LVIDd:     3.54 cm   Diastology  LVIDs:     1.95 cm   LV e' lateral:  7.02 cm/s  LV PW:     1.42 cm   LV E/e' lateral: 13.8  LV IVS:    1.16 cm   LV e' medial:  4.60 cm/s  LVOT diam:   1.70 cm   LV E/e' medial: 21.0  LV SV:     48  LV SV Index:  29  LVOT Area:   2.27 cm    LV Volumes (MOD)  LV vol d, MOD A2C: 76.7 ml  LV vol d, MOD A4C: 52.5 ml  LV vol s, MOD A2C: 22.4 ml  LV vol s, MOD A4C: 18.4 ml  LV SV MOD A2C:   54.3 ml  LV SV MOD A4C:   52.5 ml  LV SV MOD BP:   41.8 ml   RIGHT VENTRICLE       IVC  RV S prime:   20.80 cm/s IVC diam: 2.22 cm  TAPSE (M-mode): 2.3 cm   LEFT ATRIUM      Index    RIGHT ATRIUM      Index  LA diam:   3.45 cm 2.09 cm/m RA Area:   12.60 cm  LA Vol (A2C): 33.9 ml 20.53 ml/m RA Volume:  31.10 ml 18.83 ml/m  LA Vol (A4C): 36.1 ml 21.86 ml/m  AORTIC VALVE  AV Area (Vmax):  1.20 cm  AV Area (Vmean):  1.30 cm  AV Area (VTI):   1.23 cm  AV Vmax:      235.50 cm/s  AV Vmean:     152.500 cm/s  AV VTI:      0.392 m  AV Peak Grad:   22.2 mmHg  AV Mean Grad:   11.0 mmHg  LVOT Vmax:     125.00 cm/s  LVOT Vmean:    87.500 cm/s  LVOT VTI:     0.212 m  LVOT/AV VTI ratio: 0.54    AORTA  Ao Root diam: 2.90 cm   MITRAL VALVE        TRICUSPID VALVE  MV Area (PHT): 5.02 cm   TR Peak grad:  30.5 mmHg  MV Peak grad: 9.9 mmHg   TR Vmax:    276.00 cm/s  MV Mean grad: 3.0 mmHg  MV Vmax:    1.57 m/s   SHUNTS  MV Vmean:   69.7 cm/s  Systemic VTI: 0.21 m  MV Decel Time: 151 msec  Systemic Diam: 1.70 cm  MV E velocity: 96.70 cm/s  MV A velocity: 158.00 cm/s  MV E/A ratio: 0.61   Antimicrobials:  Anti-infectives (From admission, onward)   Start     Dose/Rate Route Frequency Ordered Stop   01/20/20 1000  vancomycin (VANCOCIN) 50 mg/mL oral solution 125 mg      125 mg Oral 4 times daily 01/20/20 0741 01/30/20 0959   01/20/20 0600  meropenem (MERREM) 500 mg in sodium chloride 0.9 % 100 mL IVPB  Status:  Discontinued     500 mg 200 mL/hr over 30 Minutes Intravenous Every 12 hours 01/19/20 2104 01/20/20 1519   01/20/20 0200  meropenem (MERREM) 1 g in sodium chloride 0.9 % 100 mL IVPB  Status:  Discontinued     1 g 200 mL/hr over 30 Minutes Intravenous Every 8 hours 01/19/20 2002 01/19/20 2104   01/20/20 0200  metroNIDAZOLE (FLAGYL) IVPB 500 mg  Status:  Discontinued     500 mg 100 mL/hr over 60 Minutes Intravenous Every 8 hours 01/19/20 2002 01/20/20 1519   01/19/20 1800  metroNIDAZOLE (FLAGYL) IVPB 500 mg     500 mg 100 mL/hr over 60 Minutes Intravenous  Once 01/19/20 1753 01/19/20 1921   01/19/20 1700  meropenem (MERREM) 1 g in sodium chloride 0.9 % 100 mL IVPB     1 g 200 mL/hr over 30 Minutes Intravenous  Once 01/19/20 1634 01/19/20 1808     Subjective: Seen and examined at bedside and she is extremely hard of hearing.  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  She complains of having a lot of bowel movements.  No nausea or vomiting and denies any abdominal cramping or pain.  States that she feels "okay".  No other concerns or complaints at this time but nursing states that she is not eating as much as she is having a lot of diarrhea.  Objective: Vitals:   01/23/20 1000 01/23/20 1200 01/23/20 1257 01/23/20 1400  BP: (!) 107/46 (!) 115/57  (!) 117/50  Pulse: (!) 101 97  100  Resp: 19 (!) 23  18  Temp:   98.7 F (37.1 C)   TempSrc:   Oral   SpO2: 91% 92%  93%  Weight:      Height:        Intake/Output Summary (Last 24 hours) at 01/23/2020 1551 Last data filed at 01/22/2020 2150 Gross per 24 hour  Intake 640 ml  Output 275 ml  Net 365 ml   Filed Weights   01/19/20 2139  Weight: 62.6 kg   Examination: Physical Exam:  Constitutional: The patient is a thin elderly Caucasian female currently no  acute distress appears calm but she does appear little uncomfortable complaining of some significant amount of diarrhea Eyes:  Lids and conjunctivae normal, sclerae anicteric  ENMT: External Ears, Nose appear normal. Grossly normal hearing.  Neck: Appears normal, supple, no cervical masses, normal ROM, no appreciable thyromegaly; no JVD Respiratory: Diminished to auscultation bilaterally, no wheezing, rales, rhonchi or crackles. Normal respiratory effort and patient is not tachypenic. No accessory muscle use. Unlabored breathing  Cardiovascular: Mildly tachycardic Rate but regular rhythm, no murmurs / rubs / gallops. S1 and S2 auscultated. Mild extremity edema.  Abdomen: Soft, non-tender, Distended slightly. Bowel sounds positive.  GU: Deferred. Musculoskeletal: No clubbing / cyanosis of digits/nails. No joint deformity upper and lower extremities. Has painful palpation to the lower extremities Skin: No rashes,  lesions, ulcers on a limited skin evaluation. No induration; Warm and dry.  Neurologic: CN 2-12 grossly intact with no focal deficits. Romberg sign and cerebellar reflexes not assessed.  Psychiatric: Normal judgment and insight. Alert and oriented x 3. Normal mood and appropriate affect.   Data Reviewed: I have personally reviewed following labs and imaging studies  CBC: Recent Labs  Lab 01/19/20 1454 01/20/20 0212 01/21/20 0259 01/22/20 0239 01/23/20 0309  WBC 18.4* 19.5* 12.7* 12.6* 14.0*  NEUTROABS 15.2* 17.0* 9.9* 9.3* 9.6*  HGB 12.2 10.9* 10.1* 10.5* 10.4*  HCT 39.7 36.4 31.8* 33.4* 33.6*  MCV 100.5* 104.6* 98.1 98.2 100.3*  PLT 303 297 290 288 094   Basic Metabolic Panel: Recent Labs  Lab 01/19/20 1454 01/20/20 0212 01/21/20 0259 01/22/20 0239 01/23/20 0309  NA 137 141 141 140 135  K 4.3 3.8 3.0* 4.3 4.3  CL 108 117* 109 109 105  CO2 17* 12* 21* 25 25  GLUCOSE 86 65* 111* 92 75  BUN 95* 83* 70* 66* 55*  CREATININE 2.75* 2.29* 2.03* 1.75* 1.58*  CALCIUM 9.1  8.6* 8.3* 8.3* 8.2*  MG  --   --  1.7 2.3 2.2  PHOS  --   --  3.3 2.6 2.6   GFR: Estimated Creatinine Clearance: 19.2 mL/min (A) (by C-G formula based on SCr of 1.58 mg/dL (H)). Liver Function Tests: Recent Labs  Lab 01/19/20 1454 01/20/20 0212 01/21/20 0259 01/22/20 0239 01/23/20 0309  AST 17 18 15 15 16   ALT 13 11 10 11 13   ALKPHOS 97 82 74 81 95  BILITOT 0.4 0.4 0.3 0.3 0.2*  PROT 6.6 5.5* 5.2* 5.2* 5.4*  ALBUMIN 2.6* 2.2* 2.1* 2.1* 2.2*   Recent Labs  Lab 01/19/20 1454  LIPASE 50   No results for input(s): AMMONIA in the last 168 hours. Coagulation Profile: No results for input(s): INR, PROTIME in the last 168 hours. Cardiac Enzymes: No results for input(s): CKTOTAL, CKMB, CKMBINDEX, TROPONINI in the last 168 hours. BNP (last 3 results) No results for input(s): PROBNP in the last 8760 hours. HbA1C: No results for input(s): HGBA1C in the last 72 hours. CBG: No results for input(s): GLUCAP in the last 168 hours. Lipid Profile: No results for input(s): CHOL, HDL, LDLCALC, TRIG, CHOLHDL, LDLDIRECT in the last 72 hours. Thyroid Function Tests: No results for input(s): TSH, T4TOTAL, FREET4, T3FREE, THYROIDAB in the last 72 hours. Anemia Panel: No results for input(s): VITAMINB12, FOLATE, FERRITIN, TIBC, IRON, RETICCTPCT in the last 72 hours. Sepsis Labs: Recent Labs  Lab 01/19/20 1454 01/19/20 1608 01/19/20 1825 01/20/20 0212 01/21/20 0259  PROCALCITON 0.87  --   --  0.20 0.22  LATICACIDVEN  --  0.8 0.6  --   --     Recent Results (from the past 240 hour(s))  Blood culture (routine x 2)     Status: Abnormal   Collection Time: 01/19/20  4:08 PM   Specimen: BLOOD LEFT FOREARM  Result Value Ref Range Status   Specimen Description   Final    BLOOD LEFT FOREARM Performed at [facility], [place] [address], [place], [place] [address]    Special Requests   Final    BOTTLES DRAWN AEROBIC AND ANAEROBIC Blood Culture adequate volume Performed at  [place] [address], [place], [place] [address]    Culture  Setup Time   Final    ANAEROBIC BOTTLE ONLY GRAM POSITIVE COCCI IN CLUSTERS CRITICAL RESULT CALLED TO, READ BACK BY  AND VERIFIED WITH: Lavell Luster [facility] 01/21/20 0455 JDW    Culture (A)  Final    STAPHYLOCOCCUS AURICULARIS THE SIGNIFICANCE OF ISOLATING THIS ORGANISM FROM A SINGLE SET OF BLOOD CULTURES WHEN MULTIPLE SETS ARE DRAWN IS UNCERTAIN. PLEASE NOTIFY THE MICROBIOLOGY DEPARTMENT WITHIN ONE WEEK IF SPECIATION AND SENSITIVITIES ARE REQUIRED. Performed at [facility], [place] [address] [place], [place] [address]    Report Status 01/23/2020 FINAL  Final  Blood Culture ID Panel (Reflexed)     Status: None   Collection Time: 01/19/20  4:08 PM  Result Value Ref Range Status   Enterococcus species NOT DETECTED NOT DETECTED Final   Listeria monocytogenes NOT DETECTED NOT DETECTED Final   Staphylococcus species NOT DETECTED NOT DETECTED Final   Staphylococcus aureus (BCID) NOT DETECTED NOT DETECTED Final   Streptococcus species NOT DETECTED NOT DETECTED Final   Streptococcus agalactiae NOT DETECTED NOT DETECTED Final   Streptococcus pneumoniae NOT DETECTED NOT DETECTED Final   Streptococcus pyogenes NOT DETECTED NOT DETECTED Final   Acinetobacter baumannii NOT DETECTED NOT DETECTED Final   Enterobacteriaceae species NOT DETECTED NOT DETECTED Final   Enterobacter cloacae complex NOT DETECTED NOT DETECTED Final   Escherichia coli NOT DETECTED NOT DETECTED Final   Klebsiella oxytoca NOT DETECTED NOT DETECTED Final   Klebsiella pneumoniae NOT DETECTED NOT DETECTED Final   Proteus species NOT DETECTED NOT DETECTED Final   Serratia marcescens NOT DETECTED NOT DETECTED Final   Haemophilus influenzae NOT DETECTED NOT DETECTED Final   Neisseria meningitidis NOT DETECTED NOT DETECTED Final   Pseudomonas aeruginosa NOT DETECTED NOT DETECTED Final   Candida albicans NOT DETECTED NOT DETECTED Final   Candida  glabrata NOT DETECTED NOT DETECTED Final   Candida krusei NOT DETECTED NOT DETECTED Final   Candida parapsilosis NOT DETECTED NOT DETECTED Final   Candida tropicalis NOT DETECTED NOT DETECTED Final    Comment: Performed at [facility], [place] [address], [place], [place] [address]  SARS Coronavirus 2 by RT PCR (hospital order, performed in [facility] hospital lab) Nasopharyngeal Nasopharyngeal Swab     Status: None   Collection Time: 01/19/20  4:23 PM   Specimen: Nasopharyngeal Swab  Result Value Ref Range Status   SARS Coronavirus 2 NEGATIVE NEGATIVE Final    Comment: (NOTE) SARS-CoV-2 target nucleic acids are NOT DETECTED. The SARS-CoV-2 RNA is generally detectable in upper and lower respiratory specimens during the acute phase of infection. The lowest concentration of SARS-CoV-2 viral copies this assay can detect is 250 copies / mL. A negative result does not preclude SARS-CoV-2 infection and should not be used as the sole basis for treatment or other patient management decisions.  A negative result may occur with improper specimen collection / handling, submission of specimen other than nasopharyngeal swab, presence of viral mutation(s) within the areas targeted by this assay, and inadequate number of viral copies (<250 copies / mL). A negative result must be combined with clinical observations, patient history, and epidemiological information. Fact Sheet for Patients:   StrictlyIdeas.no Fact Sheet for Healthcare Providers: BankingDealers.co.za This test is not yet approved or cleared  by the [place] FDA and has been authorized for detection and/or diagnosis of SARS-CoV-2 by FDA under an Emergency Use Authorization (EUA).  This EUA will remain in effect (meaning this test can be used) for the duration of the COVID-19 declaration under Section 564(b)(1) of the Act, 21 U.S.C. section 360bbb-3(b)(1), unless the authorization is  terminated or revoked sooner. Performed at  [facility], [place] [address], [place], [place] [address]   Urine culture     Status: Abnormal   Collection Time: 01/19/20  6:03 PM   Specimen: Urine, Random  Result Value Ref Range Status   Specimen Description   Final    URINE, RANDOM Performed at [place] [address] [place], [place] [address]    Special Requests   Final    NONE Performed at [facility], [place] [address] [place], [place] [address]    Culture >=100,000 COLONIES/mL PSEUDOMONAS AERUGINOSA (A)  Final   Report Status 01/22/2020 FINAL  Final   Organism ID, Bacteria PSEUDOMONAS AERUGINOSA (A)  Final      Susceptibility   Pseudomonas aeruginosa - MIC*    CEFTAZIDIME <=1 SENSITIVE Sensitive     CIPROFLOXACIN 1 SENSITIVE Sensitive     GENTAMICIN <=1 SENSITIVE Sensitive     IMIPENEM 1 SENSITIVE Sensitive     PIP/TAZO <=4 SENSITIVE Sensitive     CEFEPIME <=1 SENSITIVE Sensitive     * >=100,000 COLONIES/mL PSEUDOMONAS AERUGINOSA  C Difficile Quick Screen w PCR reflex     Status: Abnormal   Collection Time: 01/19/20  6:03 PM   Specimen: Stool  Result Value Ref Range Status   C Diff antigen POSITIVE (A) NEGATIVE Final   C Diff toxin NEGATIVE NEGATIVE Final   C Diff interpretation Results are indeterminate. See PCR results.  Final    Comment: Performed at [facility], [place] [address] [place], [place] [address]  C. Diff by PCR, Reflexed     Status: Abnormal   Collection Time: 01/19/20  6:03 PM  Result Value Ref Range Status   Toxigenic C. Difficile by PCR POSITIVE (A) NEGATIVE Final    Comment: Positive for toxigenic C. difficile with little to no toxin production. Only treat if clinical presentation suggests symptomatic illness. Performed at [facility], [place] [address], [place], [place] [address]   GI pathogen panel by PCR, stool     Status: None   Collection Time: 01/19/20  6:03 PM    Result Value Ref Range Status   Plesiomonas shigelloides NOT DETECTED NOT DETECTED Final   Yersinia enterocolitica NOT DETECTED NOT DETECTED Final   Vibrio NOT DETECTED NOT DETECTED Final   Enteropathogenic E coli NOT DETECTED NOT DETECTED Final   E coli (ETEC) LT/ST NOT DETECTED NOT DETECTED Final   E coli 9323 by PCR Not applicable NOT DETECTED Final   Cryptosporidium by PCR NOT DETECTED NOT DETECTED Final   Entamoeba histolytica NOT DETECTED NOT DETECTED Final   Adenovirus F 40/41 NOT DETECTED NOT DETECTED Final   Norovirus GI/GII NOT DETECTED NOT DETECTED Final   Sapovirus NOT DETECTED NOT DETECTED Final    Comment: (NOTE) A duplicate report has been generated due to demographic updates. Performed At: [facility] [place], [place] [id number] Rush Farmer MD [place]:[id number]    Vibrio cholerae NOT DETECTED NOT DETECTED Final   Campylobacter by PCR NOT DETECTED NOT DETECTED Final   Salmonella by PCR NOT DETECTED NOT DETECTED Final   E coli (STEC) NOT DETECTED NOT DETECTED Final   Enteroaggregative E coli NOT DETECTED NOT DETECTED Final   Shigella by PCR NOT DETECTED NOT DETECTED Final   Cyclospora cayetanensis NOT DETECTED NOT DETECTED Final   Astrovirus NOT DETECTED NOT DETECTED Final   G lamblia by PCR NOT DETECTED NOT DETECTED Final   Rotavirus A by PCR NOT DETECTED  NOT DETECTED Final  MRSA PCR Screening     Status: None   Collection Time: 01/19/20  9:45 PM   Specimen: Nasopharyngeal  Result Value Ref Range Status   MRSA by PCR NEGATIVE NEGATIVE Final    Comment:        The GeneXpert MRSA Assay (FDA approved for NASAL specimens only), is one component of a comprehensive MRSA colonization surveillance program. It is not intended to diagnose MRSA infection nor to guide or monitor treatment for MRSA infections. Performed at [facility], [place] [address] [place], [place] [address]   Blood culture (routine x 2)     Status: None  (Preliminary result)   Collection Time: 01/19/20 10:07 PM   Specimen: BLOOD LEFT ARM  Result Value Ref Range Status   Specimen Description   Final    BLOOD LEFT ARM Performed at [place] [address] [place], [place] [address]    Special Requests   Final    BOTTLES DRAWN AEROBIC ONLY Blood Culture adequate volume Performed at [place] [address], [place], [place] [address]    Culture   Final    NO GROWTH 4 DAYS Performed at [facility], [place] [address] [place],  [address]    Report Status PENDING  Incomplete     RN Pressure Injury Documentation: Pressure Injury 09/21/19 Buttocks Left Stage 2 -  Partial thickness loss of dermis presenting as a shallow open injury with a red, pink wound bed without slough. (Active)  09/21/19 1600  Location: Buttocks  Location Orientation: Left  Staging: Stage 2 -  Partial thickness loss of dermis presenting as a shallow open injury with a red, pink wound bed without slough.  Wound Description (Comments):   Present on Admission: Yes     Pressure Injury 09/21/19 Buttocks Right Stage 1 -  Intact skin with non-blanchable redness of a localized area usually over a bony prominence. (Active)  09/21/19 1600  Location: Buttocks  Location Orientation: Right  Staging: Stage 1 -  Intact skin with non-blanchable redness of a localized area usually over a bony prominence.  Wound Description (Comments):   Present on Admission: Yes     Radiology Studies: No results found. Scheduled Meds: . acidophilus  2 capsule Oral BID  . Chlorhexidine Gluconate Cloth  6 each Topical Q0600  . febuxostat  80 mg Oral Daily  . heparin  5,000 Units Subcutaneous Q8H  . HYDROcodone-acetaminophen  1 tablet Oral TID  . levothyroxine  75 mcg Oral Q0600  . LORazepam  0.5 mg Oral QPC lunch  . mouth rinse  15 mL Mouth Rinse BID  . traZODone  50 mg Oral QHS  . vancomycin  125 mg Oral QID   Continuous  Infusions:   LOS: 4 days   Kerney Elbe, DO [facility] PAGER is on AMION  If 7PM-7AM, please contact night-coverage [email or web address]

## 2020-01-23 NOTE — Consult Note (Signed)
Palliative care consult note  Reason for consult: Goals of care in light of recurrent admissions and continued functional decline  Palliative care consult received.  Chart reviewed including personal review of pertinent labs and imaging.  Briefly, Krystal Mckenzie is a 86 year old female with past medical history of hypertension, hypothyroidism, GERD, lower extremity swelling, CKD, chronic pain count with recurrent UTIs was sent for [facility] with concern for dehydration.  She has been nauseous at the facility and concern with labs that showed worsening of renal function and KUB with possible ileus and she was transition to hospital for further evaluation.  She was noted to have sinus pause on telemetry and cardiology evaluated and related this was likely due to sepsis physiology with recommendation for avoiding AV nodal agents and continuing antibiotics and hydration.  Palliative consulted for goals of care.  I met today with Krystal Mckenzie.  She is a very sweet 86 year old female who is awake and alert at time of my encounter.  She is very hard of hearing which limits history to some extent, but with repeating and some yelling, she appears to be understanding of her situation.  We discussed her clinical course this admission as well as how she has been doing for the past few months.  She has had 3 admissions including one for COVID-19 infection, another for UTI, and now this third admission where there is concern for colitis related to possible C. difficile infection.  Overall, Krystal Mckenzie reports that she has "pretty good" quality of life at [facility] and everybody is, "just so nice to me there."  We discussed her desire to be at [facility] and consideration if the hospital continues to benefit her and her goal of having as much quality time as possible with family and friends.  She states that she feels the hospital, "just has some things they can offer over there (at [facility])" and she feels that "of  course!" she benefits from hospitalizations.  States that she knows she has been in the hospital a lot recently, however, she feels that this is due to some "bumps in the road" and she feels that her quality of life when she returns to [place] [facility] is still something that she enjoys and it is acceptable to her.  -DNR/DNI-Krystal Mckenzie has limit of care in place but feels that she continues to benefit from hospitalizations.  We discussed that the goal of the hospital is to allow her quality time outside of the hospital and there is always concern whenever hospitalizations begin to become more frequent.  She expressed understanding but was very clear that she feels that she is continuing to benefit from hospitalization at this time and wants to continue to return the hospital whenever she is ill in the future. -I would recommend an outpatient palliative care team to follow-up with Krystal Mckenzie when she returns to her facility.  This would likely be the best way to continue to help her to evaluate if she continues to benefit from recurrent hospitalizations.  If so, I recommended that she continue with this care plan.  As she continues to decline in her cognition, nutrition, and functional status, she may reach a point where she would be better served to focus on providing care at [facility] with assistance of organization such as hospice.  At this point in time, however, she is not interested in any care plan that would involve not returning to the hospital whenever she becomes acutely ill.  Start time  1750 End time 1840 Total time: 50 minutes  Greater than 50%  of this time was spent counseling and coordinating care related to the above assessment and plan.  Micheline Rough, MD [place] [facility] [phone]

## 2020-01-23 NOTE — Progress Notes (Signed)
OT Cancellation Note  Patient Details Name: Krystal Mckenzie MRN: [id number] DOB: 12-17-1926   Cancelled Treatment:    Reason Eval/Treat Not Completed: OT screened, no needs identified, will sign off.  Patient reports being basically bed bound at facility and predominantly total care assist. Patient reports she is able to feed herself and perform grooming with set up. Patient at baseline.  Vonda L Carmack 01/23/2020, 1:35 PM

## 2020-01-23 NOTE — Progress Notes (Signed)
PT Cancellation Note  Patient Details Name: Krystal Mckenzie MRN: [id number] DOB: 29-Oct-1926   Cancelled Treatment:    Reason Eval/Treat Not Completed: PT screened, no needs identified, will sign off. Pt is reportedly total care at baseline other than feeding self. Per previous PT notes in January 2021 at [facility] pt was total assist of 2 for bed mobility (supine<>sit)   [facility] 01/23/2020, 1:58 PM

## 2020-01-24 ENCOUNTER — Encounter (HOSPITAL_COMMUNITY): Payer: Self-pay | Admitting: Internal Medicine

## 2020-01-24 DIAGNOSIS — Z8616 Personal history of COVID-19: Secondary | ICD-10-CM

## 2020-01-24 LAB — GLUCOSE, CAPILLARY
Glucose-Capillary: 100 mg/dL — ABNORMAL HIGH (ref 70–99)
Glucose-Capillary: 102 mg/dL — ABNORMAL HIGH (ref 70–99)
Glucose-Capillary: 76 mg/dL (ref 70–99)
Glucose-Capillary: 91 mg/dL (ref 70–99)
Glucose-Capillary: 92 mg/dL (ref 70–99)
Glucose-Capillary: 92 mg/dL (ref 70–99)

## 2020-01-24 LAB — COMPREHENSIVE METABOLIC PANEL
ALT: 11 U/L (ref 0–44)
AST: 16 U/L (ref 15–41)
Albumin: 2 g/dL — ABNORMAL LOW (ref 3.5–5.0)
Alkaline Phosphatase: 92 U/L (ref 38–126)
Anion gap: 7 (ref 5–15)
BUN: 47 mg/dL — ABNORMAL HIGH (ref 8–23)
CO2: 24 mmol/L (ref 22–32)
Calcium: 8.2 mg/dL — ABNORMAL LOW (ref 8.9–10.3)
Chloride: 106 mmol/L (ref 98–111)
Creatinine, Ser: 1.56 mg/dL — ABNORMAL HIGH (ref 0.44–1.00)
GFR calc Af Amer: 33 mL/min — ABNORMAL LOW (ref >60)
GFR calc non Af Amer: 29 mL/min — ABNORMAL LOW (ref >60)
Glucose, Bld: 110 mg/dL — ABNORMAL HIGH (ref 70–99)
Potassium: 4.3 mmol/L (ref 3.5–5.1)
Sodium: 137 mmol/L (ref 135–145)
Total Bilirubin: 0.3 mg/dL (ref 0.3–1.2)
Total Protein: 4.9 g/dL — ABNORMAL LOW (ref 6.5–8.1)

## 2020-01-24 LAB — CBC WITH DIFFERENTIAL/PLATELET
Abs Immature Granulocytes: 0.08 10*3/uL — ABNORMAL HIGH (ref 0.00–0.07)
Basophils Absolute: 0 10*3/uL (ref 0.0–0.1)
Basophils Relative: 0 %
Eosinophils Absolute: 0.4 10*3/uL (ref 0.0–0.5)
Eosinophils Relative: 4 %
HCT: 32.6 % — ABNORMAL LOW (ref 36.0–46.0)
Hemoglobin: 9.8 g/dL — ABNORMAL LOW (ref 12.0–15.0)
Immature Granulocytes: 1 %
Lymphocytes Relative: 16 %
Lymphs Abs: 1.9 10*3/uL (ref 0.7–4.0)
MCH: 30.3 pg (ref 26.0–34.0)
MCHC: 30.1 g/dL (ref 30.0–36.0)
MCV: 100.9 fL — ABNORMAL HIGH (ref 80.0–100.0)
Monocytes Absolute: 1 10*3/uL (ref 0.1–1.0)
Monocytes Relative: 9 %
Neutro Abs: 7.9 10*3/uL — ABNORMAL HIGH (ref 1.7–7.7)
Neutrophils Relative %: 70 %
Platelets: 265 10*3/uL (ref 150–400)
RBC: 3.23 MIL/uL — ABNORMAL LOW (ref 3.87–5.11)
RDW: 14.6 % (ref 11.5–15.5)
WBC: 11.3 10*3/uL — ABNORMAL HIGH (ref 4.0–10.5)
nRBC: 0 % (ref 0.0–0.2)

## 2020-01-24 LAB — PHOSPHORUS: Phosphorus: 2.8 mg/dL (ref 2.5–4.6)

## 2020-01-24 LAB — CULTURE, BLOOD (ROUTINE X 2)
Culture: NO GROWTH
Special Requests: ADEQUATE

## 2020-01-24 LAB — MAGNESIUM: Magnesium: 1.9 mg/dL (ref 1.7–2.4)

## 2020-01-24 NOTE — Progress Notes (Signed)
PROGRESS NOTE    Krystal Mckenzie  UXN:[id number] DOB: 09-Sep-1926 DOA: 01/19/2020 PCP: Patient, No Pcp Per   Brief Narrative:  HPI per Dr. Mikki Harbor on 01/19/20  Krystal Mckenzie is a 86 y.o. female with HTN, hypothyroidism, GERD, DHF/LE swelling, CKD baseline Crt 1.6, chronic pain, gout and history of recurrent UTIs was sent from [facility] with concern for dehydration.  Apparently patient has been nauseous and facility got basic labs and KUB to evaluate.  Lab work indicated mild worsening of renal function with BUN of 95 and creatinine of 2.59 while KUB indicated possible ileus.  Patient extremely hard of hearing which is a major impediment to her history.  She reports history of nausea and loose stools but denies any vomiting.  Denies any abdominal pain.  Personal social history: Resident of [employer].  Reports is widowed with children.  Denies any smoking, drinking or illicit drug use.  **Interim History  Cardiology and infectious disease were consulted for further evaluation.  Cardiology ordered an echo and have signed off the case now.  Infectious disease recommending de-escalating antibiotics to just p.o. vancomycin. She had 1 out of 2 blood cultures of unidentified cocci that may be contaminant per ID they do not recommend restarting IV vancomycin they recommend continuing p.o. oral vancomycin for total 14 days for possible C. difficile colitis.  She has 10 more days remaining and ID feels that her urine and her blood cultures were contaminants.  Palliative care has been consulted for goals of care discussion and she remains a DNR/DNI but after discussion  Patient feels like she benefits from hospitalization and palliative care is now recommending outpatient palliative care team to follow-up when she returns her facility.  Patient seems to be improving and was more alert today than she was when she first came in and appears to be closer to baseline.  Continues to have  significant amount of diarrhea so a Flexi-Seal is been placed and we are going to have [name] cream for her given her continued diarrhea.  Diarrhea is slowly improving but still pretty significant.  She was not taking an oral intake as much so we will restart IV fluid hydration low-dose with D5 half-normal saline and monitor CBG every 4 and consult nutrition for dietary assessment.  We will hold off on further fluid until we see how she does with her food.  She may need a calorie count  Assessment & Plan:   Principal Problem:   Sepsis ([place]) Active Problems:   Acute renal failure superimposed on chronic kidney disease ([place])   Recurrent UTI   Hypothyroid   Hypertension   Chronic diastolic CHF (congestive heart failure) (HCC)   History of pulmonary embolism   History of COVID-19   CKD (chronic kidney disease), stage III   Diarrhea   Macrocytic anemia   GERD (gastroesophageal reflux disease)   Bradycardia   Atherosclerotic peripheral vascular disease (HCC)  Sepsis, poA and likely in the setting of C. difficile colitis -Question UTI  Vs colitis in the setting of C. Difficile; the Link Snuffer is that she has C. difficile colitis and she is now having Bowel Movements and has a Flexi-Seal -GI pathogen panel is negative -Patient already received 2.5 L in the ED.  Will be careful with IV hydration given history of diastolic heart failure and IV fluid with sodium bicarbonate now stopped but she is having poor oral intake so will resume low dose fluid with D5 1/2 NS at 75 mL/hr for  overnight and for 16 hours  -Sepsis physiology is improving somewhat -She is positive for the antigen as well as PCR positive but negative for toxin -Meropenem/Flagyl (meropenem does cover anaerobes) initiated in the ED however this will be stopped per ID recommendations -Infectious diseases recommending continue with oral vancomycin for concern for C. difficile -ID consult  this a.m. and Dr. Megan Salon favors continue  vancomycin for now stopping meropenem and metronidazole and does not feel that the urine specimen has been to be very helpful given the does not appear to be a catheterized specimen.  He feels that even though her C. difficile toxin is negative she is certainly at risk for C. difficile given her recent hospitalization and antibiotic exposures -Patient's procalcitonin level was 0.87 and is now 0.22 -WBC is elevated and went from 18.4 -> 19.5 and is now improving and is 12.6 yesterday but slightly worsened to 14.0 and is now trending down and is 11.3 -Continues have significant amount of diarrhea so we will place a Flexi-Seal and order Butt cream; nursing states she is not eating well as she should so will reinitiate fluid short term -Blood cultures grew 1 out of 2 culture showing unidentified gram-positive cocci in clusters that may be a contaminant and infectious diseases does not recommend starting IV vancomycin again -Urine culture showed Pseudomonas dialysis pansensitive however infectious disease feels that it was not a catheterized specimen and does not feel that it needs to be treated; she does have a Foley catheter in but will discontinue is now -Continue to Follow Cx's and Repeat CBC in the AM   AKI on CKD stage IIIa, improving Metabolic Acidosis  -BUN and creatinine on admission was 95/2.75 and after fluid hydration she is improved to 47/1.56 today -Patient has a Metabolic Acidosis with a  CO2 of 12, Chloride 117, and AG of 12; now this is improved with a CO2 of 24, anion gap of 7, chloride level of 106 -Foley to be placed in ED. -Holding torsemide, Lactulose. -IV fluid has now been stopped -Avoid nephrotoxic medications, contrast dyes, hypotension and renally adjust medications  -Continue to monitor and trend renal function repeat CMP in a.m.  Sick sinus syndrome/Sinus Bradycardia -While on stepdown, pt had a sinus pause >3sec. she is now improved and stable for transfer to the medical  floor -c/t hold BB.  -Consulted Cardiology and she was asymptomatic and this happened when she was asleep and Cardiology feels that it iwa sin the setting of Sepsis -Cardiology Recommending Avoiding AV nodal blocking agents and no further intervention   Chronic Diastolic CHF -BNP on admission was 155.9 -Echocardiogram was ordered by cardiology and is below -Chest x-ray did show small left pleural effusion -Patient is + 2.754 L since admission -Currently her torsemide is being held and she is given IV fluids as above and this is now stopped -Continue to monitor volume status carefully and she does not appear to be significantly volume overloaded on examination. -Resuming torsemide in the a.m. if diarrhea is improving I will continue to hold diuretics and her fluid is now stopped  Presumed PE -Pt completed 3 months of Eliquis on 01/11/2020.  HTN -Holding hydralazine, Imdur, Toprol-XL for borderline BPs -Also holding torsemide may resume in the a.m. -Continue to monitor carefully  Hypokalemia -Patient's potassium was 3.0 and is now improved to 4.3 -Continue to monitor and replete as necessary -Repeat CMP none  Hypothyroidism -C/w Levothyroxine 75 mcg po Daily   GERD -Initially continued her PPI however with  concern for C. difficile will discontinue at this time  Hypoalbuminemia and poor po Intake -Her albumin level is now 2.2 -Check CBGs q4h; CBGs ranging from 76-104 -We will consult nutrition for further evaluation recommendations -IVF Hydration as above  Gout -C/w Febuxostat 80 mg po Daily   Normocytic Anemia/Anemia of Chronic Kidney Disease -The patient's hemoglobin/hematocrit went from 10.9/36.4 is now 9.8/32.6 -Check anemia panel in a.m.  -Continue to monitor for signs and symptoms of bleeding; currently no overt bleeding noted -Repeat CBC in a.m.  DVT prophylaxis: Heparin 5,000 units sq q8h Code Status: DO NOT RESUSCITATE  Family Communication: No family  present at bedside  Disposition Plan: Pending further improvement and work-up of her colitis.  Her diarrhea is still pretty significant and slowing down some so we will continue monitor while she is in the hospital; will need PT OT to further evaluate and they have signed off as she is essentially total care. Have also called palliative care for goals of care discussion and she continues to wish to get rehospitalized if necessary  Status is: Inpatient  Remains inpatient appropriate because:Unsafe d/c plan and IV treatments appropriate due to intensity of illness or inability to take PO; will need PT and OT to further evaluate and treat   Dispo: The patient is from: ALF              Anticipated d/c is to: SNF              Anticipated d/c date is: 2 days              Patient currently is not medically stable to d/c.  Consultants:   Infectious Diseases  Cardiology   Palliative Care   Procedures:  ECHOCARDIOGRAM 1. Left ventricular ejection fraction, by estimation, is 60 to 65%. The  left ventricle has normal function. The left ventricle has no regional  wall motion abnormalities. There is mild left ventricular hypertrophy.  Left ventricular diastolic parameters  are consistent with age-related delayed relaxation (normal).  2. Right ventricular systolic function is normal. The right ventricular  size is normal. There is mildly elevated pulmonary artery systolic  pressure.  3. Nodular calcification of the anterior leaflet tip. The mitral valve is  degenerative. Trivial mitral valve regurgitation. No evidence of mitral  stenosis.  4. The aortic valve is tricuspid. Aortic valve regurgitation is not  visualized. Mild aortic valve stenosis.  5. The inferior vena cava is dilated in size with >50% respiratory  variability, suggesting right atrial pressure of 8 mmHg.   FINDINGS  Left Ventricle: Left ventricular ejection fraction, by estimation, is 60  to 65%. The left ventricle  has normal function. The left ventricle has no  regional wall motion abnormalities. The left ventricular internal cavity  size was normal in size. There is  mild left ventricular hypertrophy. Left ventricular diastolic parameters  are consistent with age-related delayed relaxation (normal).   Right Ventricle: The right ventricular size is normal. No increase in  right ventricular wall thickness. Right ventricular systolic function is  normal. There is mildly elevated pulmonary artery systolic pressure. The  tricuspid regurgitant velocity is 2.76  m/s, and with an assumed right atrial pressure of 8 mmHg, the estimated  right ventricular systolic pressure is 95.2 mmHg.   Left Atrium: Left atrial size was normal in size.   Right Atrium: Right atrial size was normal in size.   Pericardium: There is no evidence of pericardial effusion.   Mitral Valve: Nodular calcification  of the anterior leaflet tip. The  mitral valve is degenerative in appearance. There is moderate thickening  of the mitral valve leaflet(s). There is moderate calcification of the  mitral valve leaflet(s). Normal mobility  of the mitral valve leaflets. Moderate mitral annular calcification.  Trivial mitral valve regurgitation. No evidence of mitral valve stenosis.  MV peak gradient, 9.9 mmHg. The mean mitral valve gradient is 3.0 mmHg.   Tricuspid Valve: The tricuspid valve is normal in structure. Tricuspid  valve regurgitation is trivial. No evidence of tricuspid stenosis.   Aortic Valve: The aortic valve is tricuspid. . There is moderate  thickening and moderate calcification of the aortic valve. Aortic valve  regurgitation is not visualized. Mild aortic stenosis is present. There is  moderate thickening of the aortic valve.  There is moderate calcification of the aortic valve. Aortic valve mean  gradient measures 11.0 mmHg. Aortic valve peak gradient measures 22.2  mmHg. Aortic valve area, by VTI measures 1.23  cm.   Pulmonic Valve: The pulmonic valve was normal in structure. Pulmonic valve  regurgitation is not visualized. No evidence of pulmonic stenosis.   Aorta: The aortic root is normal in size and structure.   Venous: The inferior vena cava is dilated in size with greater than 50%  respiratory variability, suggesting right atrial pressure of 8 mmHg.   IAS/Shunts: No atrial level shunt detected by color flow Doppler.     LEFT VENTRICLE  PLAX 2D  LVIDd:     3.54 cm   Diastology  LVIDs:     1.95 cm   LV e' lateral:  7.02 cm/s  LV PW:     1.42 cm   LV E/e' lateral: 13.8  LV IVS:    1.16 cm   LV e' medial:  4.60 cm/s  LVOT diam:   1.70 cm   LV E/e' medial: 21.0  LV SV:     48  LV SV Index:  29  LVOT Area:   2.27 cm    LV Volumes (MOD)  LV vol d, MOD A2C: 76.7 ml  LV vol d, MOD A4C: 52.5 ml  LV vol s, MOD A2C: 22.4 ml  LV vol s, MOD A4C: 18.4 ml  LV SV MOD A2C:   54.3 ml  LV SV MOD A4C:   52.5 ml  LV SV MOD BP:   41.8 ml   RIGHT VENTRICLE       IVC  RV S prime:   20.80 cm/s IVC diam: 2.22 cm  TAPSE (M-mode): 2.3 cm   LEFT ATRIUM      Index    RIGHT ATRIUM      Index  LA diam:   3.45 cm 2.09 cm/m RA Area:   12.60 cm  LA Vol (A2C): 33.9 ml 20.53 ml/m RA Volume:  31.10 ml 18.83 ml/m  LA Vol (A4C): 36.1 ml 21.86 ml/m  AORTIC VALVE  AV Area (Vmax):  1.20 cm  AV Area (Vmean):  1.30 cm  AV Area (VTI):   1.23 cm  AV Vmax:      235.50 cm/s  AV Vmean:     152.500 cm/s  AV VTI:      0.392 m  AV Peak Grad:   22.2 mmHg  AV Mean Grad:   11.0 mmHg  LVOT Vmax:     125.00 cm/s  LVOT Vmean:    87.500 cm/s  LVOT VTI:     0.212 m  LVOT/AV VTI ratio: 0.54    AORTA  Ao Root diam: 2.90 cm   MITRAL VALVE        TRICUSPID VALVE  MV Area (PHT): 5.02 cm   TR Peak grad:  30.5 mmHg  MV Peak grad: 9.9 mmHg   TR Vmax:    276.00 cm/s  MV Mean grad:  3.0 mmHg  MV Vmax:    1.57 m/s   SHUNTS  MV Vmean:   69.7 cm/s  Systemic VTI: 0.21 m  MV Decel Time: 151 msec   Systemic Diam: 1.70 cm  MV E velocity: 96.70 cm/s  MV A velocity: 158.00 cm/s  MV E/A ratio: 0.61   Antimicrobials:  Anti-infectives (From admission, onward)   Start     Dose/Rate Route Frequency Ordered Stop   01/20/20 1000  vancomycin (VANCOCIN) 50 mg/mL oral solution 125 mg     125 mg Oral 4 times daily 01/20/20 0741 01/30/20 0959   01/20/20 0600  meropenem (MERREM) 500 mg in sodium chloride 0.9 % 100 mL IVPB  Status:  Discontinued     500 mg 200 mL/hr over 30 Minutes Intravenous Every 12 hours 01/19/20 2104 01/20/20 1519   01/20/20 0200  meropenem (MERREM) 1 g in sodium chloride 0.9 % 100 mL IVPB  Status:  Discontinued     1 g 200 mL/hr over 30 Minutes Intravenous Every 8 hours 01/19/20 2002 01/19/20 2104   01/20/20 0200  metroNIDAZOLE (FLAGYL) IVPB 500 mg  Status:  Discontinued     500 mg 100 mL/hr over 60 Minutes Intravenous Every 8 hours 01/19/20 2002 01/20/20 1519   01/19/20 1800  metroNIDAZOLE (FLAGYL) IVPB 500 mg     500 mg 100 mL/hr over 60 Minutes Intravenous  Once 01/19/20 1753 01/19/20 1921   01/19/20 1700  meropenem (MERREM) 1 g in sodium chloride 0.9 % 100 mL IVPB     1 g 200 mL/hr over 30 Minutes Intravenous  Once 01/19/20 1634 01/19/20 1808     Subjective: Seen and examined at bedside and she is extremely hard of hearing but hears better on her right side.  No nausea or vomiting.  Still having some bowel movements.  Denies any abdominal cramping.  Does not want her legs to be touched.  Says that she is doing okay.  No other concerns or complaints at this time and states that her Flexi-Seal comes out she does not want it reinserted.  Objective: Vitals:   01/24/20 0011 01/24/20 0427 01/24/20 0846 01/24/20 1409  BP: 131/62 130/61 139/68 (!) 142/60  Pulse: 96 96 (!) 102 (!) 102  Resp: 20 20 20 20   Temp: (!) 97.5 F (36.4 C) 98.7 F (37.1  C) 98.5 F (36.9 C) 99.3 F (37.4 C)  TempSrc: Oral Oral Oral Oral  SpO2: 95% 94% 93% 94%  Weight:      Height:        Intake/Output Summary (Last 24 hours) at 01/24/2020 1604 Last data filed at 01/24/2020 0700 Gross per 24 hour  Intake 1006.09 ml  Output 100 ml  Net 906.09 ml   Filed Weights   01/19/20 2139  Weight: 62.6 kg   Examination: Physical Exam:  Constitutional: The patient is a thin elderly Caucasian in NAD and appears calm  Eyes: Lids and conjunctivae normal, sclerae anicteric  ENMT: External Ears, Nose appear normal. Hard of hearing.  Neck: Appears normal, supple, no cervical masses, normal ROM, no appreciable thyromegaly Respiratory: Diminished to auscultation bilaterally, no wheezing, rales, rhonchi or crackles. Normal respiratory effort and patient is not tachypenic. No  accessory muscle use. Unlabored breathing  Cardiovascular: Slightly tachycardic rate; Has a 2/6 Systolic Murmur  Abdomen: Soft, non-tender, distended due to body habitus and has hyperactive bowel sounds.  GU: Deferred. Musculoskeletal: No clubbing / cyanosis of digits/nails. No joint deformity upper and lower extremities.  Skin: No rashes, lesions, ulcers on a limited skin evaluation. No induration; Warm and dry.  Neurologic: CN 2-12 grossly intact with no focal deficits. Romberg sign and cerebellar reflexes not assessed.  Psychiatric: Normal judgment and insight. Alert and oriented x 3. Normal mood and appropriate affect.   Data Reviewed: I have personally reviewed following labs and imaging studies  CBC: Recent Labs  Lab 01/20/20 0212 01/21/20 0259 01/22/20 0239 01/23/20 0309 01/24/20 0402  WBC 19.5* 12.7* 12.6* 14.0* 11.3*  NEUTROABS 17.0* 9.9* 9.3* 9.6* 7.9*  HGB 10.9* 10.1* 10.5* 10.4* 9.8*  HCT 36.4 31.8* 33.4* 33.6* 32.6*  MCV 104.6* 98.1 98.2 100.3* 100.9*  PLT 297 290 288 245 161   Basic Metabolic Panel: Recent Labs  Lab 01/20/20 0212 01/21/20 0259 01/22/20 0239  01/23/20 0309 01/24/20 0402  NA 141 141 140 135 137  K 3.8 3.0* 4.3 4.3 4.3  CL 117* 109 109 105 106  CO2 12* 21* 25 25 24   GLUCOSE 65* 111* 92 75 110*  BUN 83* 70* 66* 55* 47*  CREATININE 2.29* 2.03* 1.75* 1.58* 1.56*  CALCIUM 8.6* 8.3* 8.3* 8.2* 8.2*  MG  --  1.7 2.3 2.2 1.9  PHOS  --  3.3 2.6 2.6 2.8   GFR: Estimated Creatinine Clearance: 19.4 mL/min (A) (by C-G formula based on SCr of 1.56 mg/dL (H)). Liver Function Tests: Recent Labs  Lab 01/20/20 0212 01/21/20 0259 01/22/20 0239 01/23/20 0309 01/24/20 0402  AST 18 15 15 16 16   ALT 11 10 11 13 11   ALKPHOS 82 74 81 95 92  BILITOT 0.4 0.3 0.3 0.2* 0.3  PROT 5.5* 5.2* 5.2* 5.4* 4.9*  ALBUMIN 2.2* 2.1* 2.1* 2.2* 2.0*   Recent Labs  Lab 01/19/20 1454  LIPASE 50   No results for input(s): AMMONIA in the last 168 hours. Coagulation Profile: No results for input(s): INR, PROTIME in the last 168 hours. Cardiac Enzymes: No results for input(s): CKTOTAL, CKMB, CKMBINDEX, TROPONINI in the last 168 hours. BNP (last 3 results) No results for input(s): PROBNP in the last 8760 hours. HbA1C: No results for input(s): HGBA1C in the last 72 hours. CBG: Recent Labs  Lab 01/23/20 2148 01/24/20 0007 01/24/20 0424 01/24/20 0843 01/24/20 1410  GLUCAP 92 91 102* 92 76   Lipid Profile: No results for input(s): CHOL, HDL, LDLCALC, TRIG, CHOLHDL, LDLDIRECT in the last 72 hours. Thyroid Function Tests: No results for input(s): TSH, T4TOTAL, FREET4, T3FREE, THYROIDAB in the last 72 hours. Anemia Panel: No results for input(s): VITAMINB12, FOLATE, FERRITIN, TIBC, IRON, RETICCTPCT in the last 72 hours. Sepsis Labs: Recent Labs  Lab 01/19/20 1454 01/19/20 1608 01/19/20 1825 01/20/20 0212 01/21/20 0259  PROCALCITON 0.87  --   --  0.20 0.22  LATICACIDVEN  --  0.8 0.6  --   --     Recent Results (from the past 240 hour(s))  Blood culture (routine x 2)     Status: Abnormal   Collection Time: 01/19/20  4:08 PM   Specimen:  BLOOD LEFT FOREARM  Result Value Ref Range Status   Specimen Description   Final    BLOOD LEFT FOREARM Performed at [facility], [place] [address] [place], [place] [address]  Special Requests   Final    BOTTLES DRAWN AEROBIC AND ANAEROBIC Blood Culture adequate volume Performed at [facility] [address], [place], [place] [address]    Culture  Setup Time   Final    ANAEROBIC BOTTLE ONLY GRAM POSITIVE COCCI IN CLUSTERS CRITICAL RESULT CALLED TO, READ BACK BY AND VERIFIED WITH: J GRIMSLEY [facility] 01/21/20 0455 JDW    Culture (A)  Final    STAPHYLOCOCCUS AURICULARIS THE SIGNIFICANCE OF ISOLATING THIS ORGANISM FROM A SINGLE SET OF BLOOD CULTURES WHEN MULTIPLE SETS ARE DRAWN IS UNCERTAIN. PLEASE NOTIFY THE MICROBIOLOGY DEPARTMENT WITHIN ONE WEEK IF SPECIATION AND SENSITIVITIES ARE REQUIRED. Performed at [facility], [place] [address] [place], [place] [address]    Report Status 01/23/2020 FINAL  Final  Blood Culture ID Panel (Reflexed)     Status: None   Collection Time: 01/19/20  4:08 PM  Result Value Ref Range Status   Enterococcus species NOT DETECTED NOT DETECTED Final   Listeria monocytogenes NOT DETECTED NOT DETECTED Final   Staphylococcus species NOT DETECTED NOT DETECTED Final   Staphylococcus aureus (BCID) NOT DETECTED NOT DETECTED Final   Streptococcus species NOT DETECTED NOT DETECTED Final   Streptococcus agalactiae NOT DETECTED NOT DETECTED Final   Streptococcus pneumoniae NOT DETECTED NOT DETECTED Final   Streptococcus pyogenes NOT DETECTED NOT DETECTED Final   Acinetobacter baumannii NOT DETECTED NOT DETECTED Final   Enterobacteriaceae species NOT DETECTED NOT DETECTED Final   Enterobacter cloacae complex NOT DETECTED NOT DETECTED Final   Escherichia coli NOT DETECTED NOT DETECTED Final   Klebsiella oxytoca NOT DETECTED NOT DETECTED Final   Klebsiella pneumoniae NOT DETECTED NOT DETECTED Final   Proteus species NOT DETECTED  NOT DETECTED Final   Serratia marcescens NOT DETECTED NOT DETECTED Final   Haemophilus influenzae NOT DETECTED NOT DETECTED Final   Neisseria meningitidis NOT DETECTED NOT DETECTED Final   Pseudomonas aeruginosa NOT DETECTED NOT DETECTED Final   Candida albicans NOT DETECTED NOT DETECTED Final   Candida glabrata NOT DETECTED NOT DETECTED Final   Candida krusei NOT DETECTED NOT DETECTED Final   Candida parapsilosis NOT DETECTED NOT DETECTED Final   Candida tropicalis NOT DETECTED NOT DETECTED Final    Comment: Performed at [facility], [place] [address], [place], [place] [address]  SARS Coronavirus 2 by RT PCR (hospital order, performed in [facility] hospital lab) Nasopharyngeal Nasopharyngeal Swab     Status: None   Collection Time: 01/19/20  4:23 PM   Specimen: Nasopharyngeal Swab  Result Value Ref Range Status   SARS Coronavirus 2 NEGATIVE NEGATIVE Final    Comment: (NOTE) SARS-CoV-2 target nucleic acids are NOT DETECTED. The SARS-CoV-2 RNA is generally detectable in upper and lower respiratory specimens during the acute phase of infection. The lowest concentration of SARS-CoV-2 viral copies this assay can detect is 250 copies / mL. A negative result does not preclude SARS-CoV-2 infection and should not be used as the sole basis for treatment or other patient management decisions.  A negative result may occur with improper specimen collection / handling, submission of specimen other than nasopharyngeal swab, presence of viral mutation(s) within the areas targeted by this assay, and inadequate number of viral copies (<250 copies / mL). A negative result must be combined with clinical observations, patient history, and epidemiological information. Fact Sheet for Patients:   StrictlyIdeas.no Fact Sheet for Healthcare Providers: BankingDealers.co.za This test is not yet approved or cleared  by the [place] FDA and  has been  authorized for detection and/or diagnosis of SARS-CoV-2 by FDA under an Emergency Use Authorization (EUA).  This EUA will remain in effect (meaning this test can be used) for the duration of the COVID-19 declaration under Section 564(b)(1) of the Act, 21 U.S.C. section 360bbb-3(b)(1), unless the authorization is terminated or revoked sooner. Performed at [facility], [place] [address] [place], [place] [address]   Urine culture     Status: Abnormal   Collection Time: 01/19/20  6:03 PM   Specimen: Urine, Random  Result Value Ref Range Status   Specimen Description   Final    URINE, RANDOM Performed at [facility] [address], [place], [place] [address]    Special Requests   Final    NONE Performed at [facility], [place] [address], [place], [place] [address]    Culture >=100,000 COLONIES/mL PSEUDOMONAS AERUGINOSA (A)  Final   Report Status 01/22/2020 FINAL  Final   Organism ID, Bacteria PSEUDOMONAS AERUGINOSA (A)  Final      Susceptibility   Pseudomonas aeruginosa - MIC*    CEFTAZIDIME <=1 SENSITIVE Sensitive     CIPROFLOXACIN 1 SENSITIVE Sensitive     GENTAMICIN <=1 SENSITIVE Sensitive     IMIPENEM 1 SENSITIVE Sensitive     PIP/TAZO <=4 SENSITIVE Sensitive     CEFEPIME <=1 SENSITIVE Sensitive     * >=100,000 COLONIES/mL PSEUDOMONAS AERUGINOSA  C Difficile Quick Screen w PCR reflex     Status: Abnormal   Collection Time: 01/19/20  6:03 PM   Specimen: Stool  Result Value Ref Range Status   C Diff antigen POSITIVE (A) NEGATIVE Final   C Diff toxin NEGATIVE NEGATIVE Final   C Diff interpretation Results are indeterminate. See PCR results.  Final    Comment: Performed at [facility], [place] [address], [place], [place] [address]  C. Diff by PCR, Reflexed     Status: Abnormal   Collection Time: 01/19/20  6:03 PM  Result Value Ref Range Status   Toxigenic C. Difficile by PCR POSITIVE (A) NEGATIVE Final     Comment: Positive for toxigenic C. difficile with little to no toxin production. Only treat if clinical presentation suggests symptomatic illness. Performed at [facility], [place] [address], [place], [place] [address]   GI pathogen panel by PCR, stool     Status: None   Collection Time: 01/19/20  6:03 PM  Result Value Ref Range Status   Plesiomonas shigelloides NOT DETECTED NOT DETECTED Final   Yersinia enterocolitica NOT DETECTED NOT DETECTED Final   Vibrio NOT DETECTED NOT DETECTED Final   Enteropathogenic E coli NOT DETECTED NOT DETECTED Final   E coli (ETEC) LT/ST NOT DETECTED NOT DETECTED Final   E coli 1700 by PCR Not applicable NOT DETECTED Final   Cryptosporidium by PCR NOT DETECTED NOT DETECTED Final   Entamoeba histolytica NOT DETECTED NOT DETECTED Final   Adenovirus F 40/41 NOT DETECTED NOT DETECTED Final   Norovirus GI/GII NOT DETECTED NOT DETECTED Final   Sapovirus NOT DETECTED NOT DETECTED Final    Comment: (NOTE) A duplicate report has been generated due to demographic updates. Performed At: [facility] [place], [place] [id number] Rush Farmer MD RF:[id number]    Vibrio cholerae NOT DETECTED NOT DETECTED Final   Campylobacter by PCR NOT DETECTED NOT DETECTED Final   Salmonella by PCR NOT DETECTED NOT DETECTED Final   E coli (STEC) NOT DETECTED NOT DETECTED  Final   Enteroaggregative E coli NOT DETECTED NOT DETECTED Final   Shigella by PCR NOT DETECTED NOT DETECTED Final   Cyclospora cayetanensis NOT DETECTED NOT DETECTED Final   Astrovirus NOT DETECTED NOT DETECTED Final   G lamblia by PCR NOT DETECTED NOT DETECTED Final   Rotavirus A by PCR NOT DETECTED NOT DETECTED Final  MRSA PCR Screening     Status: None   Collection Time: 01/19/20  9:45 PM   Specimen: Nasopharyngeal  Result Value Ref Range Status   MRSA by PCR NEGATIVE NEGATIVE Final    Comment:        The GeneXpert MRSA Assay (FDA approved for NASAL specimens only), is one  component of a comprehensive MRSA colonization surveillance program. It is not intended to diagnose MRSA infection nor to guide or monitor treatment for MRSA infections. Performed at [facility], [place] [address], [place], [place] [address]   Blood culture (routine x 2)     Status: None   Collection Time: 01/19/20 10:07 PM   Specimen: BLOOD LEFT ARM  Result Value Ref Range Status   Specimen Description   Final    BLOOD LEFT ARM Performed at [facility] [address], [place], [place] [address]    Special Requests   Final    BOTTLES DRAWN AEROBIC ONLY Blood Culture adequate volume Performed at [facility] [address], [place], [place] [address]    Culture   Final    NO GROWTH 5 DAYS Performed at [facility], [place] [address], [place], [place] [address]    Report Status 01/24/2020 FINAL  Final     RN Pressure Injury Documentation: Pressure Injury 09/21/19 Buttocks Left Stage 2 -  Partial thickness loss of dermis presenting as a shallow open injury with a red, pink wound bed without slough. (Active)  09/21/19 1600  Location: Buttocks  Location Orientation: Left  Staging: Stage 2 -  Partial thickness loss of dermis presenting as a shallow open injury with a red, pink wound bed without slough.  Wound Description (Comments):   Present on Admission: Yes     Pressure Injury 09/21/19 Buttocks Right Stage 1 -  Intact skin with non-blanchable redness of a localized area usually over a bony prominence. (Active)  09/21/19 1600  Location: Buttocks  Location Orientation: Right  Staging: Stage 1 -  Intact skin with non-blanchable redness of a localized area usually over a bony prominence.  Wound Description (Comments):   Present on Admission: Yes     Radiology Studies: No results found. Scheduled Meds: . acidophilus  2 capsule Oral BID  . Chlorhexidine Gluconate Cloth  6 each Topical Q0600  . febuxostat  80 mg  Oral Daily  . heparin  5,000 Units Subcutaneous Q8H  . HYDROcodone-acetaminophen  1 tablet Oral TID  . levothyroxine  75 mcg Oral Q0600  . LORazepam  0.5 mg Oral QPC lunch  . mouth rinse  15 mL Mouth Rinse BID  . traZODone  50 mg Oral QHS  . vancomycin  125 mg Oral QID   Continuous Infusions:   LOS: 5 days   Kerney Elbe, DO [facility] PAGER is on [place]  If 7PM-7AM, please contact night-coverage [email or web address]

## 2020-01-25 LAB — COMPREHENSIVE METABOLIC PANEL
ALT: 11 U/L (ref 0–44)
AST: 16 U/L (ref 15–41)
Albumin: 1.9 g/dL — ABNORMAL LOW (ref 3.5–5.0)
Alkaline Phosphatase: 86 U/L (ref 38–126)
Anion gap: 4 — ABNORMAL LOW (ref 5–15)
BUN: 46 mg/dL — ABNORMAL HIGH (ref 8–23)
CO2: 25 mmol/L (ref 22–32)
Calcium: 8.1 mg/dL — ABNORMAL LOW (ref 8.9–10.3)
Chloride: 107 mmol/L (ref 98–111)
Creatinine, Ser: 1.44 mg/dL — ABNORMAL HIGH (ref 0.44–1.00)
GFR calc Af Amer: 37 mL/min — ABNORMAL LOW (ref >60)
GFR calc non Af Amer: 32 mL/min — ABNORMAL LOW (ref >60)
Glucose, Bld: 87 mg/dL (ref 70–99)
Potassium: 4.3 mmol/L (ref 3.5–5.1)
Sodium: 136 mmol/L (ref 135–145)
Total Bilirubin: 0.1 mg/dL — ABNORMAL LOW (ref 0.3–1.2)
Total Protein: 4.7 g/dL — ABNORMAL LOW (ref 6.5–8.1)

## 2020-01-25 LAB — CBC WITH DIFFERENTIAL/PLATELET
Abs Immature Granulocytes: 0.09 10*3/uL — ABNORMAL HIGH (ref 0.00–0.07)
Basophils Absolute: 0 10*3/uL (ref 0.0–0.1)
Basophils Relative: 0 %
Eosinophils Absolute: 0.3 10*3/uL (ref 0.0–0.5)
Eosinophils Relative: 3 %
HCT: 30.3 % — ABNORMAL LOW (ref 36.0–46.0)
Hemoglobin: 9.3 g/dL — ABNORMAL LOW (ref 12.0–15.0)
Immature Granulocytes: 1 %
Lymphocytes Relative: 17 %
Lymphs Abs: 1.7 10*3/uL (ref 0.7–4.0)
MCH: 31.2 pg (ref 26.0–34.0)
MCHC: 30.7 g/dL (ref 30.0–36.0)
MCV: 101.7 fL — ABNORMAL HIGH (ref 80.0–100.0)
Monocytes Absolute: 1 10*3/uL (ref 0.1–1.0)
Monocytes Relative: 10 %
Neutro Abs: 6.7 10*3/uL (ref 1.7–7.7)
Neutrophils Relative %: 69 %
Platelets: 331 10*3/uL (ref 150–400)
RBC: 2.98 MIL/uL — ABNORMAL LOW (ref 3.87–5.11)
RDW: 14.6 % (ref 11.5–15.5)
WBC: 9.7 10*3/uL (ref 4.0–10.5)
nRBC: 0 % (ref 0.0–0.2)

## 2020-01-25 LAB — MAGNESIUM: Magnesium: 1.8 mg/dL (ref 1.7–2.4)

## 2020-01-25 LAB — GLUCOSE, CAPILLARY
Glucose-Capillary: 73 mg/dL (ref 70–99)
Glucose-Capillary: 75 mg/dL (ref 70–99)
Glucose-Capillary: 78 mg/dL (ref 70–99)
Glucose-Capillary: 80 mg/dL (ref 70–99)
Glucose-Capillary: 80 mg/dL (ref 70–99)
Glucose-Capillary: 81 mg/dL (ref 70–99)

## 2020-01-25 LAB — PHOSPHORUS: Phosphorus: 3 mg/dL (ref 2.5–4.6)

## 2020-01-25 MED ORDER — KATE FARMS STANDARD 1.4 PO LIQD
325.0000 mL | ORAL | Status: DC
  Filled 2020-01-25: qty 325

## 2020-01-25 MED ORDER — ADULT MULTIVITAMIN W/MINERALS CH
1.0000 | ORAL_TABLET | Freq: Every day | ORAL | Status: DC
  Administered 2020-01-25 – 2020-01-27 (×3): 1 via ORAL
  Filled 2020-01-25 (×3): qty 1

## 2020-01-25 NOTE — Progress Notes (Signed)
Initial Nutrition Assessment  RD working remotely.   DOCUMENTATION CODES:   Not applicable  INTERVENTION:  - will order [employer] once/day, each supplement provides 455 kcal and 20 grams protein. - will order Magic Cup BID with meals, each supplement provides 290 kcal and 9 grams of protein. - will order 1 tablet multivitamin with minerals/day.    NUTRITION DIAGNOSIS:   Increased nutrient needs related to acute illness as evidenced by estimated needs.  GOAL:   Patient will meet greater than or equal to 90% of their needs  MONITOR:   PO intake, Supplement acceptance, Labs, Weight trends  REASON FOR ASSESSMENT:   Consult Assessment of nutrition requirement/status, Poor PO  ASSESSMENT:   86 y.o. female with medical history of HTN, hypothyroidism, GERD, heart failure with associated BLE swelling/edema, CKD, chronic pain, gout, and recurrent UTIs. She was sent to the ED from [facility] with concern for dehydration. Patient is noted to be extremely hard of hearing. Notes indicate patient reported hx of nausea and loose stools but denied vomiting or abdominal pain.  Patient noted to be very Bee, unable to call patient. Diet advanced from FLD to Heart Healthy on 5/20 at 0935. Per flow sheet documentation, patient consumed 0% of breakfast and lunch on 5/19; 25% of breakfast and 50% of lunch on 5/23; 0% of breakfast and lunch today.   Patient has not been seen by a [facility] at any time in the past.   Per chart review, weight on admission on 5/18 was 138 lb and weight on 10/07/19 was 159 lb. This indicates 21 lb weight loss (13.2% body weight) in the past 3 months; suspect some degree of malnutrition, but unable to confirm at this time.   Per notes: - cdiff colitis - dehydration on admission and received 2.5L IV fluid in the ED - sepsis--improving - AKI on stage 3 CKD--improving - plan for PT/OT - Palliative Care consult for GOC--DNR/DNI, desires return to the hospital if  needed in the future  Labs reviewed; CBGs: 80 x2, 75, 78 mg/dl, BUN: 46 mg/dl, creatinine: 1.44 mg/dl, Ca: 8.1 mg/dl, GFR: 32 ml/min. Medications reviewed; 2 capsules risaquad BID, 75 mcg oral synthroid/day.     NUTRITION - FOCUSED PHYSICAL EXAM:  unable to complete at this time.   Diet Order:   Diet Order            Diet Heart Room service appropriate? Yes; Fluid consistency: Thin  Diet effective now              EDUCATION NEEDS:   Not appropriate for education at this time  Skin:  Skin Assessment: Reviewed RN Assessment  Last BM:  5/23  Height:   Ht Readings from Last 1 Encounters:  01/19/20 5\' 3"  (1.6 m)    Weight:   Wt Readings from Last 1 Encounters:  01/19/20 62.6 kg     Estimated Nutritional Needs:  Kcal:  1400-1600 kcal Protein:  55-65 grasm Fluid:  >/= 1.5 L/day     Jarome Matin, MS, RD, LDN, CNSC Inpatient Clinical Dietitian RD pager # available in [facility]  After hours/weekend pager # available in [facility]

## 2020-01-25 NOTE — Progress Notes (Addendum)
PROGRESS NOTE    Krystal Mckenzie  YBW:[id number] DOB: 09/16/1926 DOA: 01/19/2020 PCP: Patient, No Pcp Per   Brief Narrative:  HPI per Dr. Mikki Harbor on 01/19/20  Krystal Mckenzie is a 86 y.o. female with HTN, hypothyroidism, GERD, DHF/LE swelling, CKD baseline Crt 1.6, chronic pain, gout and history of recurrent UTIs was sent from [facility] with concern for dehydration.  Apparently patient has been nauseous and facility got basic labs and KUB to evaluate.  Lab work indicated mild worsening of renal function with BUN of 95 and creatinine of 2.59 while KUB indicated possible ileus.  Patient extremely hard of hearing which is a major impediment to her history.  She reports history of nausea and loose stools but denies any vomiting.  Denies any abdominal pain.  Personal social history: Resident of [employer].  Reports is widowed with children.  Denies any smoking, drinking or illicit drug use.  **Interim History  Cardiology and infectious disease were consulted for further evaluation.  Cardiology ordered an echo and have signed off the case now.  Infectious disease recommending de-escalating antibiotics to just p.o. vancomycin. She had 1 out of 2 blood cultures of unidentified cocci that may be contaminant per ID they do not recommend restarting IV vancomycin they recommend continuing p.o. oral vancomycin for total 14 days for possible C. difficile colitis.  She has 10 more days remaining and ID feels that her urine and her blood cultures were contaminants.  Palliative care has been consulted for goals of care discussion and she remains a DNR/DNI but after discussion  Patient feels like she benefits from hospitalization and palliative care is now recommending outpatient palliative care team to follow-up when she returns her facility.  Patient seems to be improving and was more alert today than she was when she first came in and appears to be closer to baseline.  Continues to have  significant amount of diarrhea so a Flexi-Seal had been placed and we are going to have [name] cream for her given her continued diarrhea.  Diarrhea is improving.    She was not taking an oral intake as much restarted IV fluid hydration low-dose with D5 half-normal saline but this has now stopped  We will hold off on further fluid until we see how she does with her food.  She may need a calorie count but Dietary is on board and recommended Dillard Essex once a day along with Magic cup twice daily and a multivitamin with minerals daily. Nursing states that her oral intake is ok but not fantastic and is not eating very much so will order a Calorie Count    Assessment & Plan:   Principal Problem:   Sepsis ([place]) Active Problems:   Acute renal failure superimposed on chronic kidney disease ([place])   Recurrent UTI   Hypothyroid   Hypertension   Chronic diastolic CHF (congestive heart failure) (HCC)   History of pulmonary embolism   History of COVID-19   CKD (chronic kidney disease), stage III   Diarrhea   Macrocytic anemia   GERD (gastroesophageal reflux disease)   Bradycardia   Atherosclerotic peripheral vascular disease (HCC)  Sepsis, poA and likely in the setting of C. difficile colitis, improving  -Question UTI  Vs colitis in the setting of C. Difficile; the Link Snuffer is that she has C. difficile colitis and she is now having Bowel Movements and had a Flexi-Seal but this was removed yesterday and has had no diarrhea this Shift  -GI pathogen panel  is negative -Patient already received 2.5 L in the ED.  Will be careful with IV hydration given history of diastolic heart failure and IV fluid with sodium bicarbonate now stopped but she is having poor oral intake so will resume low dose fluid with D5 1/2 NS at 75 mL/hr but this has now been discontinued  -Sepsis physiology is improving somewhat -She is positive for the antigen as well as PCR positive but negative for toxin -Meropenem/Flagyl  (meropenem does cover anaerobes) initiated in the ED however this will be stopped per ID recommendations -Infectious diseases recommending continue with oral vancomycin for concern for C. difficile -ID consult  this a.m. and Dr. Megan Salon favors continue vancomycin for now stopping meropenem and metronidazole and does not feel that the urine specimen has been to be very helpful given the does not appear to be a catheterized specimen.  He feels that even though her C. difficile toxin is negative she is certainly at risk for C. difficile given her recent hospitalization and antibiotic exposures -Patient's procalcitonin level was 0.87 and is now 0.22 -WBC is elevated and went from 18.4 -> 19.5 and is now improving and is 12.6 yesterday but slightly worsened to 14.0 and is now trending down and is 9.7 and improved  -Continued have significant amount of diarrhea so we will place a Flexi-Seal and order Butt cream; nursing states she is not eating well as she should so will reinitiate fluid short term as above but this has stopped  -Blood cultures grew 1 out of 2 culture showing unidentified gram-positive cocci in clusters that may be a contaminant and infectious diseases does not recommend starting IV vancomycin again -Urine culture showed Pseudomonas dialysis pansensitive however infectious disease feels that it was not a catheterized specimen and does not feel that it needs to be treated; she does have a Foley catheter in but will discontinue is now -Continue to Follow Cx's and Repeat CBC in the AM   AKI on CKD stage IIIa, improving Metabolic Acidosis  -BUN and creatinine on admission was 95/2.75 and after fluid hydration she is improved to 46/1.44 today -Patient has a Metabolic Acidosis with a  CO2 of 12, Chloride 117, and AG of 12; now this is improved with a CO2 of 25, anion gap of 7, chloride level of 106 -Foley to be placed in ED. -Holding torsemide, Lactulose. -IV fluid has now been stopped -Avoid  nephrotoxic medications, contrast dyes, hypotension and renally adjust medications  -Continue to monitor and trend renal function repeat CMP in a.m.  Sick sinus syndrome/Sinus Bradycardia -While on stepdown, pt had a sinus pause >3sec. she is now improved and stable for transfer to the medical floor -c/t hold BB.  -Consulted Cardiology and she was asymptomatic and this happened when she was asleep and Cardiology feels that it iwa sin the setting of Sepsis -Cardiology Recommending Avoiding AV nodal blocking agents and no further intervention   Chronic Diastolic CHF -BNP on admission was 155.9 -Echocardiogram was ordered by cardiology and is below -Chest x-ray did show small left pleural effusion -Patient is + 2.374 L since admission -Currently her torsemide is being held and she is given IV fluids as above and this is now stopped -Continue to monitor volume status carefully and she does not appear to be significantly volume overloaded on examination. -Resuming torsemide in the a.m. if diarrhea is improving I will continue to hold diuretics and her fluid is now stopped  Presumed PE -Pt completed 3 months of Eliquis  on 01/11/2020.  HTN -Holding hydralazine, Imdur, Toprol-XL for borderline BPs -Also holding torsemide may resume in the a.m. -Continue to monitor carefully  Hypokalemia -Patient's potassium was 3.0 and is now improved to 4.3 -Continue to monitor and replete as necessary -Repeat CMP none  Hypothyroidism -C/w Levothyroxine 75 mcg po Daily   GERD -Initially continued her PPI however with concern for C. difficile will discontinue at this time  Hypoalbuminemia and poor po Intake -Her albumin level is now 2.2 -Check CBGs q4h; CBGs ranging from 73-100 -We will consult nutrition for further evaluation recommendations -IVF Hydration as above  Gout -C/w Febuxostat 80 mg po Daily   Normocytic Anemia/Anemia of Chronic Kidney Disease -The patient's hemoglobin/hematocrit  went from 10.9/36.4 is now 9.3/30.3 -Check anemia panel in a.m.  -Continue to monitor for signs and symptoms of bleeding; currently no overt bleeding noted -Repeat CBC in a.m.  DVT prophylaxis: Heparin 5,000 units sq q8h Code Status: DO NOT RESUSCITATE  Family Communication: No family present at bedside and attempted to call the daughter to give her an updated but she did not pick up the telephone  Disposition Plan: Pending further improvement and work-up of her colitis.  Her diarrhea is still pretty significant and slowing down some so we will continue monitor while she is in the hospital; will need PT OT to further evaluate and they have signed off as she is essentially total care. Have also called palliative care for goals of care discussion and she continues to wish to get rehospitalized if necessary  Status is: Inpatient  Remains inpatient appropriate because:Unsafe d/c plan and IV treatments appropriate due to intensity of illness or inability to take PO; will need PT and OT to further evaluate and treat   Dispo: The patient is from: SNF              Anticipated d/c is to: SNF              Anticipated d/c date is: 2 days              Patient currently is not medically stable to d/c.  Consultants:   Infectious Diseases  Cardiology   Palliative Care   Procedures:  ECHOCARDIOGRAM 1. Left ventricular ejection fraction, by estimation, is 60 to 65%. The  left ventricle has normal function. The left ventricle has no regional  wall motion abnormalities. There is mild left ventricular hypertrophy.  Left ventricular diastolic parameters  are consistent with age-related delayed relaxation (normal).  2. Right ventricular systolic function is normal. The right ventricular  size is normal. There is mildly elevated pulmonary artery systolic  pressure.  3. Nodular calcification of the anterior leaflet tip. The mitral valve is  degenerative. Trivial mitral valve regurgitation. No  evidence of mitral  stenosis.  4. The aortic valve is tricuspid. Aortic valve regurgitation is not  visualized. Mild aortic valve stenosis.  5. The inferior vena cava is dilated in size with >50% respiratory  variability, suggesting right atrial pressure of 8 mmHg.   FINDINGS  Left Ventricle: Left ventricular ejection fraction, by estimation, is 60  to 65%. The left ventricle has normal function. The left ventricle has no  regional wall motion abnormalities. The left ventricular internal cavity  size was normal in size. There is  mild left ventricular hypertrophy. Left ventricular diastolic parameters  are consistent with age-related delayed relaxation (normal).   Right Ventricle: The right ventricular size is normal. No increase in  right ventricular wall thickness. Right  ventricular systolic function is  normal. There is mildly elevated pulmonary artery systolic pressure. The  tricuspid regurgitant velocity is 2.76  m/s, and with an assumed right atrial pressure of 8 mmHg, the estimated  right ventricular systolic pressure is 00.1 mmHg.   Left Atrium: Left atrial size was normal in size.   Right Atrium: Right atrial size was normal in size.   Pericardium: There is no evidence of pericardial effusion.   Mitral Valve: Nodular calcification of the anterior leaflet tip. The  mitral valve is degenerative in appearance. There is moderate thickening  of the mitral valve leaflet(s). There is moderate calcification of the  mitral valve leaflet(s). Normal mobility  of the mitral valve leaflets. Moderate mitral annular calcification.  Trivial mitral valve regurgitation. No evidence of mitral valve stenosis.  MV peak gradient, 9.9 mmHg. The mean mitral valve gradient is 3.0 mmHg.   Tricuspid Valve: The tricuspid valve is normal in structure. Tricuspid  valve regurgitation is trivial. No evidence of tricuspid stenosis.   Aortic Valve: The aortic valve is tricuspid. . There is  moderate  thickening and moderate calcification of the aortic valve. Aortic valve  regurgitation is not visualized. Mild aortic stenosis is present. There is  moderate thickening of the aortic valve.  There is moderate calcification of the aortic valve. Aortic valve mean  gradient measures 11.0 mmHg. Aortic valve peak gradient measures 22.2  mmHg. Aortic valve area, by VTI measures 1.23 cm.   Pulmonic Valve: The pulmonic valve was normal in structure. Pulmonic valve  regurgitation is not visualized. No evidence of pulmonic stenosis.   Aorta: The aortic root is normal in size and structure.   Venous: The inferior vena cava is dilated in size with greater than 50%  respiratory variability, suggesting right atrial pressure of 8 mmHg.   IAS/Shunts: No atrial level shunt detected by color flow Doppler.     LEFT VENTRICLE  PLAX 2D  LVIDd:     3.54 cm   Diastology  LVIDs:     1.95 cm   LV e' lateral:  7.02 cm/s  LV PW:     1.42 cm   LV E/e' lateral: 13.8  LV IVS:    1.16 cm   LV e' medial:  4.60 cm/s  LVOT diam:   1.70 cm   LV E/e' medial: 21.0  LV SV:     48  LV SV Index:  29  LVOT Area:   2.27 cm    LV Volumes (MOD)  LV vol d, MOD A2C: 76.7 ml  LV vol d, MOD A4C: 52.5 ml  LV vol s, MOD A2C: 22.4 ml  LV vol s, MOD A4C: 18.4 ml  LV SV MOD A2C:   54.3 ml  LV SV MOD A4C:   52.5 ml  LV SV MOD BP:   41.8 ml   RIGHT VENTRICLE       IVC  RV S prime:   20.80 cm/s IVC diam: 2.22 cm  TAPSE (M-mode): 2.3 cm   LEFT ATRIUM      Index    RIGHT ATRIUM      Index  LA diam:   3.45 cm 2.09 cm/m RA Area:   12.60 cm  LA Vol (A2C): 33.9 ml 20.53 ml/m RA Volume:  31.10 ml 18.83 ml/m  LA Vol (A4C): 36.1 ml 21.86 ml/m  AORTIC VALVE  AV Area (Vmax):  1.20 cm  AV Area (Vmean):  1.30 cm  AV Area (VTI):   1.23 cm  AV Vmax:      235.50 cm/s  AV Vmean:     152.500 cm/s  AV VTI:      0.392 m    AV Peak Grad:   22.2 mmHg  AV Mean Grad:   11.0 mmHg  LVOT Vmax:     125.00 cm/s  LVOT Vmean:    87.500 cm/s  LVOT VTI:     0.212 m  LVOT/AV VTI ratio: 0.54    AORTA  Ao Root diam: 2.90 cm   MITRAL VALVE        TRICUSPID VALVE  MV Area (PHT): 5.02 cm   TR Peak grad:  30.5 mmHg  MV Peak grad: 9.9 mmHg   TR Vmax:    276.00 cm/s  MV Mean grad: 3.0 mmHg  MV Vmax:    1.57 m/s   SHUNTS  MV Vmean:   69.7 cm/s  Systemic VTI: 0.21 m  MV Decel Time: 151 msec   Systemic Diam: 1.70 cm  MV E velocity: 96.70 cm/s  MV A velocity: 158.00 cm/s  MV E/A ratio: 0.61   Antimicrobials:  Anti-infectives (From admission, onward)   Start     Dose/Rate Route Frequency Ordered Stop   01/20/20 1000  vancomycin (VANCOCIN) 50 mg/mL oral solution 125 mg     125 mg Oral 4 times daily 01/20/20 0741 01/30/20 0959   01/20/20 0600  meropenem (MERREM) 500 mg in sodium chloride 0.9 % 100 mL IVPB  Status:  Discontinued     500 mg 200 mL/hr over 30 Minutes Intravenous Every 12 hours 01/19/20 2104 01/20/20 1519   01/20/20 0200  meropenem (MERREM) 1 g in sodium chloride 0.9 % 100 mL IVPB  Status:  Discontinued     1 g 200 mL/hr over 30 Minutes Intravenous Every 8 hours 01/19/20 2002 01/19/20 2104   01/20/20 0200  metroNIDAZOLE (FLAGYL) IVPB 500 mg  Status:  Discontinued     500 mg 100 mL/hr over 60 Minutes Intravenous Every 8 hours 01/19/20 2002 01/20/20 1519   01/19/20 1800  metroNIDAZOLE (FLAGYL) IVPB 500 mg     500 mg 100 mL/hr over 60 Minutes Intravenous  Once 01/19/20 1753 01/19/20 1921   01/19/20 1700  meropenem (MERREM) 1 g in sodium chloride 0.9 % 100 mL IVPB     1 g 200 mL/hr over 30 Minutes Intravenous  Once 01/19/20 1634 01/19/20 1808     Subjective: Seen and examined at bedside and she is extremely hard of hearing but she thinks that she is doing okay.  She has not had any diarrhea today.  No nausea or vomiting.  Denies any abdominal cramping.   Still has not been eating very much but is drinking well.  No chest pain, lightheadedness or dizziness.  No other concerns or complaints at this time.  Objective: Vitals:   01/24/20 1616 01/24/20 2013 01/25/20 0444 01/25/20 1355  BP: 129/62 (!) 133/57 (!) 129/53 (!) 143/66  Pulse: 98 96 95 96  Resp: 20 18 20 18   Temp: 98.1 F (36.7 C) 98.9 F (37.2 C) 100.2 F (37.9 C) 98.6 F (37 C)  TempSrc: Oral Oral Oral Oral  SpO2: 94% 93% 93% 92%  Weight:      Height:        Intake/Output Summary (Last 24 hours) at 01/25/2020 1838 Last data filed at 01/25/2020 1745 Gross per 24 hour  Intake 380 ml  Output 500 ml  Net -120 ml   Filed Weights   01/19/20 2139  Weight: 62.6 kg   Examination: Physical Exam:  Constitutional: Patient is a thin elderly Caucasian female currently in no acute distress appears calm and she is asking for something to drink. Eyes: Lids and conjunctivae normal, sclerae anicteric  ENMT: External Ears, Nose appear normal.  She is extremely hard of hearing.  Neck: Appears normal, supple, no cervical masses, normal ROM, no appreciable thyromegaly; no JVD Respiratory: Diminished to auscultation bilaterally, no wheezing, rales, rhonchi or crackles. Normal respiratory effort and patient is not tachypenic. No accessory muscle use.  Unlabored breathing Cardiovascular: RRR, with a 2 or 6 systolic murmur no extremity edema.  1+ lower extremity edema but she did not do me palpate Abdomen: Soft, non-tender, distended secondary to body habitus. Has normal bowel sounds positive.  GU: Deferred. Musculoskeletal: No clubbing / cyanosis of digits/nails. No joint deformity upper and lower extremities but she does not be palpate her lower extremities due to significant pain.  Skin: No rashes, lesions, ulcers on limited skin evaluation. No induration; Warm and dry.  Neurologic: CN 2-12 grossly intact with no focal deficits. Romberg sign and cerebellar reflexes not assessed.  Psychiatric:  Normal judgment and insight. Alert and oriented x 3. Normal mood and appropriate affect.   Data Reviewed: I have personally reviewed following labs and imaging studies  CBC: Recent Labs  Lab 01/21/20 0259 01/22/20 0239 01/23/20 0309 01/24/20 0402 01/25/20 0414  WBC 12.7* 12.6* 14.0* 11.3* 9.7  NEUTROABS 9.9* 9.3* 9.6* 7.9* 6.7  HGB 10.1* 10.5* 10.4* 9.8* 9.3*  HCT 31.8* 33.4* 33.6* 32.6* 30.3*  MCV 98.1 98.2 100.3* 100.9* 101.7*  PLT 290 288 245 265 694   Basic Metabolic Panel: Recent Labs  Lab 01/21/20 0259 01/22/20 0239 01/23/20 0309 01/24/20 0402 01/25/20 0414  NA 141 140 135 137 136  K 3.0* 4.3 4.3 4.3 4.3  CL 109 109 105 106 107  CO2 21* 25 25 24 25   GLUCOSE 111* 92 75 110* 87  BUN 70* 66* 55* 47* 46*  CREATININE 2.03* 1.75* 1.58* 1.56* 1.44*  CALCIUM 8.3* 8.3* 8.2* 8.2* 8.1*  MG 1.7 2.3 2.2 1.9 1.8  PHOS 3.3 2.6 2.6 2.8 3.0   GFR: Estimated Creatinine Clearance: 21 mL/min (A) (by C-G formula based on SCr of 1.44 mg/dL (H)). Liver Function Tests: Recent Labs  Lab 01/21/20 0259 01/22/20 0239 01/23/20 0309 01/24/20 0402 01/25/20 0414  AST 15 15 16 16 16   ALT 10 11 13 11 11   ALKPHOS 74 81 95 92 86  BILITOT 0.3 0.3 0.2* 0.3 0.1*  PROT 5.2* 5.2* 5.4* 4.9* 4.7*  ALBUMIN 2.1* 2.1* 2.2* 2.0* 1.9*   Recent Labs  Lab 01/19/20 1454  LIPASE 50   No results for input(s): AMMONIA in the last 168 hours. Coagulation Profile: No results for input(s): INR, PROTIME in the last 168 hours. Cardiac Enzymes: No results for input(s): CKTOTAL, CKMB, CKMBINDEX, TROPONINI in the last 168 hours. BNP (last 3 results) No results for input(s): PROBNP in the last 8760 hours. HbA1C: No results for input(s): HGBA1C in the last 72 hours. CBG: Recent Labs  Lab 01/25/20 0003 01/25/20 0440 01/25/20 0738 01/25/20 1118 01/25/20 1630  GLUCAP 80 80 75 78 73   Lipid Profile: No results for input(s): CHOL, HDL, LDLCALC, TRIG, CHOLHDL, LDLDIRECT in the last 72 hours. Thyroid  Function Tests: No results for input(s): TSH, T4TOTAL, FREET4, T3FREE, THYROIDAB in the last 72 hours. Anemia Panel: No results for input(s): VITAMINB12, FOLATE, FERRITIN, TIBC, IRON, RETICCTPCT in the last 72 hours.  Sepsis Labs: Recent Labs  Lab 01/19/20 1454 01/19/20 1608 01/19/20 1825 01/20/20 0212 01/21/20 0259  PROCALCITON 0.87  --   --  0.20 0.22  LATICACIDVEN  --  0.8 0.6  --   --     Recent Results (from the past 240 hour(s))  Blood culture (routine x 2)     Status: Abnormal   Collection Time: 01/19/20  4:08 PM   Specimen: BLOOD LEFT FOREARM  Result Value Ref Range Status   Specimen Description   Final    BLOOD LEFT FOREARM Performed at [facility], [place] [address] [place], [place] [address]    Special Requests   Final    BOTTLES DRAWN AEROBIC AND ANAEROBIC Blood Culture adequate volume Performed at [facility] [address] [place], [place] [address]    Culture  Setup Time   Final    ANAEROBIC BOTTLE ONLY GRAM POSITIVE COCCI IN CLUSTERS CRITICAL RESULT CALLED TO, READ BACK BY AND VERIFIED WITH: J GRIMSLEY [facility] 01/21/20 0455 JDW    Culture (A)  Final    STAPHYLOCOCCUS AURICULARIS THE SIGNIFICANCE OF ISOLATING THIS ORGANISM FROM A SINGLE SET OF BLOOD CULTURES WHEN MULTIPLE SETS ARE DRAWN IS UNCERTAIN. PLEASE NOTIFY THE MICROBIOLOGY DEPARTMENT WITHIN ONE WEEK IF SPECIATION AND SENSITIVITIES ARE REQUIRED. Performed at [facility], [place] [address] [place], [place] [address]    Report Status 01/23/2020 FINAL  Final  Blood Culture ID Panel (Reflexed)     Status: None   Collection Time: 01/19/20  4:08 PM  Result Value Ref Range Status   Enterococcus species NOT DETECTED NOT DETECTED Final   Listeria monocytogenes NOT DETECTED NOT DETECTED Final   Staphylococcus species NOT DETECTED NOT DETECTED Final   Staphylococcus aureus (BCID) NOT DETECTED NOT DETECTED Final   Streptococcus species NOT DETECTED NOT DETECTED Final    Streptococcus agalactiae NOT DETECTED NOT DETECTED Final   Streptococcus pneumoniae NOT DETECTED NOT DETECTED Final   Streptococcus pyogenes NOT DETECTED NOT DETECTED Final   Acinetobacter baumannii NOT DETECTED NOT DETECTED Final   Enterobacteriaceae species NOT DETECTED NOT DETECTED Final   Enterobacter cloacae complex NOT DETECTED NOT DETECTED Final   Escherichia coli NOT DETECTED NOT DETECTED Final   Klebsiella oxytoca NOT DETECTED NOT DETECTED Final   Klebsiella pneumoniae NOT DETECTED NOT DETECTED Final   Proteus species NOT DETECTED NOT DETECTED Final   Serratia marcescens NOT DETECTED NOT DETECTED Final   Haemophilus influenzae NOT DETECTED NOT DETECTED Final   Neisseria meningitidis NOT DETECTED NOT DETECTED Final   Pseudomonas aeruginosa NOT DETECTED NOT DETECTED Final   Candida albicans NOT DETECTED NOT DETECTED Final   Candida glabrata NOT DETECTED NOT DETECTED Final   Candida krusei NOT DETECTED NOT DETECTED Final   Candida parapsilosis NOT DETECTED NOT DETECTED Final   Candida tropicalis NOT DETECTED NOT DETECTED Final    Comment: Performed at [facility], [place] [address], [place],  [address]  SARS Coronavirus 2 by RT PCR (hospital order, performed in [facility] hospital lab) Nasopharyngeal Nasopharyngeal Swab     Status: None   Collection Time: 01/19/20  4:23 PM   Specimen: Nasopharyngeal Swab  Result Value Ref Range Status   SARS Coronavirus 2 NEGATIVE NEGATIVE Final    Comment: (NOTE) SARS-CoV-2 target nucleic acids are NOT DETECTED. The SARS-CoV-2 RNA is generally detectable in upper and lower respiratory specimens during the acute phase of infection. The lowest concentration of SARS-CoV-2 viral copies this assay can detect  is 250 copies / mL. A negative result does not preclude SARS-CoV-2 infection and should not be used as the sole basis for treatment or other patient management decisions.  A negative result may occur with improper specimen  collection / handling, submission of specimen other than nasopharyngeal swab, presence of viral mutation(s) within the areas targeted by this assay, and inadequate number of viral copies (<250 copies / mL). A negative result must be combined with clinical observations, patient history, and epidemiological information. Fact Sheet for Patients:   StrictlyIdeas.no Fact Sheet for Healthcare Providers: BankingDealers.co.za This test is not yet approved or cleared  by the [place] FDA and has been authorized for detection and/or diagnosis of SARS-CoV-2 by FDA under an Emergency Use Authorization (EUA).  This EUA will remain in effect (meaning this test can be used) for the duration of the COVID-19 declaration under Section 564(b)(1) of the Act, 21 U.S.C. section 360bbb-3(b)(1), unless the authorization is terminated or revoked sooner. Performed at [facility], [place] [address] [place], [place] [address]   Urine culture     Status: Abnormal   Collection Time: 01/19/20  6:03 PM   Specimen: Urine, Random  Result Value Ref Range Status   Specimen Description   Final    URINE, RANDOM Performed at [facility] [address] [place] [place], [place] [address]    Special Requests   Final    NONE Performed at [facility], [place] [address], [place], [place] [address]    Culture >=100,000 COLONIES/mL PSEUDOMONAS AERUGINOSA (A)  Final   Report Status 01/22/2020 FINAL  Final   Organism ID, Bacteria PSEUDOMONAS AERUGINOSA (A)  Final      Susceptibility   Pseudomonas aeruginosa - MIC*    CEFTAZIDIME <=1 SENSITIVE Sensitive     CIPROFLOXACIN 1 SENSITIVE Sensitive     GENTAMICIN <=1 SENSITIVE Sensitive     IMIPENEM 1 SENSITIVE Sensitive     PIP/TAZO <=4 SENSITIVE Sensitive     CEFEPIME <=1 SENSITIVE Sensitive     * >=100,000 COLONIES/mL PSEUDOMONAS AERUGINOSA  C Difficile Quick Screen w PCR  reflex     Status: Abnormal   Collection Time: 01/19/20  6:03 PM   Specimen: Stool  Result Value Ref Range Status   C Diff antigen POSITIVE (A) NEGATIVE Final   C Diff toxin NEGATIVE NEGATIVE Final   C Diff interpretation Results are indeterminate. See PCR results.  Final    Comment: Performed at [facility], [place] [address] [place], [place] [address]  C. Diff by PCR, Reflexed     Status: Abnormal   Collection Time: 01/19/20  6:03 PM  Result Value Ref Range Status   Toxigenic C. Difficile by PCR POSITIVE (A) NEGATIVE Final    Comment: Positive for toxigenic C. difficile with little to no toxin production. Only treat if clinical presentation suggests symptomatic illness. Performed at [facility], [place] [address] [place], [place] [address]   GI pathogen panel by PCR, stool     Status: None   Collection Time: 01/19/20  6:03 PM  Result Value Ref Range Status   Plesiomonas shigelloides NOT DETECTED NOT DETECTED Final   Yersinia enterocolitica NOT DETECTED NOT DETECTED Final   Vibrio NOT DETECTED NOT DETECTED Final   Enteropathogenic E coli NOT DETECTED NOT DETECTED Final   E coli (ETEC) LT/ST NOT DETECTED NOT DETECTED Final   E coli 9233 by PCR Not applicable NOT DETECTED Final   Cryptosporidium by  PCR NOT DETECTED NOT DETECTED Final   Entamoeba histolytica NOT DETECTED NOT DETECTED Final   Adenovirus F 40/41 NOT DETECTED NOT DETECTED Final   Norovirus GI/GII NOT DETECTED NOT DETECTED Final   Sapovirus NOT DETECTED NOT DETECTED Final    Comment: (NOTE) A duplicate report has been generated due to demographic updates. Performed At: [facility] [place], [place] [id number] Rush Farmer MD QM:[id number]    Vibrio cholerae NOT DETECTED NOT DETECTED Final   Campylobacter by PCR NOT DETECTED NOT DETECTED Final   Salmonella by PCR NOT DETECTED NOT DETECTED Final   E coli (STEC) NOT DETECTED NOT DETECTED Final   Enteroaggregative E coli  NOT DETECTED NOT DETECTED Final   Shigella by PCR NOT DETECTED NOT DETECTED Final   Cyclospora cayetanensis NOT DETECTED NOT DETECTED Final   Astrovirus NOT DETECTED NOT DETECTED Final   G lamblia by PCR NOT DETECTED NOT DETECTED Final   Rotavirus A by PCR NOT DETECTED NOT DETECTED Final  MRSA PCR Screening     Status: None   Collection Time: 01/19/20  9:45 PM   Specimen: Nasopharyngeal  Result Value Ref Range Status   MRSA by PCR NEGATIVE NEGATIVE Final    Comment:        The GeneXpert MRSA Assay (FDA approved for NASAL specimens only), is one component of a comprehensive MRSA colonization surveillance program. It is not intended to diagnose MRSA infection nor to guide or monitor treatment for MRSA infections. Performed at [facility], [place] [address] [place], [place] [address]   Blood culture (routine x 2)     Status: None   Collection Time: 01/19/20 10:07 PM   Specimen: BLOOD LEFT ARM  Result Value Ref Range Status   Specimen Description   Final    BLOOD LEFT ARM Performed at [place] [address] [place], [place] [address]    Special Requests   Final    BOTTLES DRAWN AEROBIC ONLY Blood Culture adequate volume Performed at [facility] [address] [place], [place] [address]    Culture   Final    NO GROWTH 5 DAYS Performed at [facility], [place] [address] [place], [place] [address]    Report Status 01/24/2020 FINAL  Final     RN Pressure Injury Documentation: Pressure Injury 09/21/19 Buttocks Left Stage 2 -  Partial thickness loss of dermis presenting as a shallow open injury with a red, pink wound bed without slough. (Active)  09/21/19 1600  Location: Buttocks  Location Orientation: Left  Staging: Stage 2 -  Partial thickness loss of dermis presenting as a shallow open injury with a red, pink wound bed without slough.  Wound Description (Comments):   Present on Admission: Yes       Pressure Injury 09/21/19 Buttocks Right Stage 1 -  Intact skin with non-blanchable redness of a localized area usually over a bony prominence. (Active)  09/21/19 1600  Location: Buttocks  Location Orientation: Right  Staging: Stage 1 -  Intact skin with non-blanchable redness of a localized area usually over a bony prominence.  Wound Description (Comments):   Present on Admission: Yes   Interventions: Magic cup, MVI, Other (Comment)([place] 1.4) Radiology Studies: No results found. Scheduled Meds: . acidophilus  2 capsule Oral BID  . Chlorhexidine Gluconate Cloth  6 each Topical Q0600  . febuxostat  80 mg Oral Daily  . [START ON 01/26/2020] feeding supplement (K[employer] STANDARD 1.4)  325 mL Oral Q24H  . heparin  5,000 Units Subcutaneous Q8H  . HYDROcodone-acetaminophen  1 tablet Oral TID  . levothyroxine  75 mcg Oral Q0600  . LORazepam  0.5 mg Oral QPC lunch  . mouth rinse  15 mL Mouth Rinse BID  . multivitamin with minerals  1 tablet Oral Daily  . traZODone  50 mg Oral QHS  . vancomycin  125 mg Oral QID   Continuous Infusions:   LOS: 6 days   Kerney Elbe, DO [facility] PAGER is on AMION  If 7PM-7AM, please contact night-coverage [email or web address]

## 2020-01-25 NOTE — TOC Progression Note (Signed)
Transition of Care [facility]) - Progression Note    Patient Details  Name: Krystal Mckenzie MRN: [id number] Date of Birth: September 28, 1926  [facility] CM/SW Contact  Purcell Mouton, RN Phone Number: 01/25/2020, 3:18 PM  Clinical Narrative:    A call to [facility] was made, left VM.  Waiting return answer.    Expected Discharge Plan: Skilled Nursing Facility[place] Barriers to Discharge: Continued Medical Work up  Expected Discharge Plan and Services Expected Discharge Plan: Skilled Nursing Facility([place])   Discharge Planning Services: CM Consult   Living arrangements for the past 2 months: [place]                                       Social Determinants of Health (SDOH) Interventions    Readmission Risk Interventions Readmission Risk Prevention Plan 10/08/2019 09/25/2019  Transportation Screening Complete Complete  HRI or Home Care Consult - Not Complete  HRI or Home Care Consult comments - Going to SNF  Social Work Consult for [place] Planning/Counseling - Complete  Palliative Care Screening - Not Applicable  Medication Review [employer]) Referral to Pharmacy Complete  PCP or Specialist appointment within 3-5 days of discharge Complete -  SW Recovery Care/Counseling Consult Complete -  Palliative Care Screening Complete -  [place] Complete -  Some recent data might be hidden

## 2020-01-26 LAB — CBC WITH DIFFERENTIAL/PLATELET
Abs Immature Granulocytes: 0.08 10*3/uL — ABNORMAL HIGH (ref 0.00–0.07)
Basophils Absolute: 0 10*3/uL (ref 0.0–0.1)
Basophils Relative: 0 %
Eosinophils Absolute: 0.2 10*3/uL (ref 0.0–0.5)
Eosinophils Relative: 2 %
HCT: 32.5 % — ABNORMAL LOW (ref 36.0–46.0)
Hemoglobin: 9.8 g/dL — ABNORMAL LOW (ref 12.0–15.0)
Immature Granulocytes: 1 %
Lymphocytes Relative: 18 %
Lymphs Abs: 1.8 10*3/uL (ref 0.7–4.0)
MCH: 30.5 pg (ref 26.0–34.0)
MCHC: 30.2 g/dL (ref 30.0–36.0)
MCV: 101.2 fL — ABNORMAL HIGH (ref 80.0–100.0)
Monocytes Absolute: 1 10*3/uL (ref 0.1–1.0)
Monocytes Relative: 11 %
Neutro Abs: 6.7 10*3/uL (ref 1.7–7.7)
Neutrophils Relative %: 68 %
Platelets: 278 10*3/uL (ref 150–400)
RBC: 3.21 MIL/uL — ABNORMAL LOW (ref 3.87–5.11)
RDW: 14.2 % (ref 11.5–15.5)
WBC: 9.8 10*3/uL (ref 4.0–10.5)
nRBC: 0 % (ref 0.0–0.2)

## 2020-01-26 LAB — RETICULOCYTES
Immature Retic Fract: 13.3 % (ref 2.3–15.9)
RBC.: 3.25 MIL/uL — ABNORMAL LOW (ref 3.87–5.11)
Retic Count, Absolute: 26.7 10*3/uL (ref 19.0–186.0)
Retic Ct Pct: 0.8 % (ref 0.4–3.1)

## 2020-01-26 LAB — GLUCOSE, CAPILLARY
Glucose-Capillary: 122 mg/dL — ABNORMAL HIGH (ref 70–99)
Glucose-Capillary: 73 mg/dL (ref 70–99)
Glucose-Capillary: 75 mg/dL (ref 70–99)
Glucose-Capillary: 81 mg/dL (ref 70–99)
Glucose-Capillary: 83 mg/dL (ref 70–99)

## 2020-01-26 LAB — COMPREHENSIVE METABOLIC PANEL
ALT: 14 U/L (ref 0–44)
AST: 20 U/L (ref 15–41)
Albumin: 2 g/dL — ABNORMAL LOW (ref 3.5–5.0)
Alkaline Phosphatase: 92 U/L (ref 38–126)
Anion gap: 6 (ref 5–15)
BUN: 41 mg/dL — ABNORMAL HIGH (ref 8–23)
CO2: 22 mmol/L (ref 22–32)
Calcium: 8.3 mg/dL — ABNORMAL LOW (ref 8.9–10.3)
Chloride: 108 mmol/L (ref 98–111)
Creatinine, Ser: 1.57 mg/dL — ABNORMAL HIGH (ref 0.44–1.00)
GFR calc Af Amer: 33 mL/min — ABNORMAL LOW (ref >60)
GFR calc non Af Amer: 29 mL/min — ABNORMAL LOW (ref >60)
Glucose, Bld: 108 mg/dL — ABNORMAL HIGH (ref 70–99)
Potassium: 4.4 mmol/L (ref 3.5–5.1)
Sodium: 136 mmol/L (ref 135–145)
Total Bilirubin: 0.3 mg/dL (ref 0.3–1.2)
Total Protein: 5 g/dL — ABNORMAL LOW (ref 6.5–8.1)

## 2020-01-26 LAB — IRON AND TIBC
Iron: 58 ug/dL (ref 28–170)
Saturation Ratios: 55 % — ABNORMAL HIGH (ref 10.4–31.8)
TIBC: 106 ug/dL — ABNORMAL LOW (ref 250–450)
UIBC: 48 ug/dL

## 2020-01-26 LAB — MAGNESIUM: Magnesium: 1.9 mg/dL (ref 1.7–2.4)

## 2020-01-26 LAB — VITAMIN B12: Vitamin B-12: 1340 pg/mL — ABNORMAL HIGH (ref 180–914)

## 2020-01-26 LAB — FERRITIN: Ferritin: 146 ng/mL (ref 11–307)

## 2020-01-26 LAB — FOLATE: Folate: 16.6 ng/mL (ref >5.9)

## 2020-01-26 LAB — PHOSPHORUS: Phosphorus: 3.4 mg/dL (ref 2.5–4.6)

## 2020-01-26 MED ORDER — BOOST / RESOURCE BREEZE PO LIQD CUSTOM: 1.0000 | Freq: Two times a day (BID) | ORAL | Status: DC

## 2020-01-26 NOTE — Progress Notes (Signed)
PROGRESS NOTE    Krystal Mckenzie  ZSW:[id number] DOB: [birth year] DOA: 01/19/2020 PCP: Patient, No Pcp Per   Brief Narrative:  HPI per Dr. Mikki Harbor on 01/19/20  Krystal Mckenzie is a 86 y.o. female with HTN, hypothyroidism, GERD, DHF/LE swelling, CKD baseline Crt 1.6, chronic pain, gout and history of recurrent UTIs was sent from [facility] with concern for dehydration.  Apparently patient has been nauseous and facility got basic labs and KUB to evaluate.  Lab work indicated mild worsening of renal function with BUN of 95 and creatinine of 2.59 while KUB indicated possible ileus.  Patient extremely hard of hearing which is a major impediment to her history.  She reports history of nausea and loose stools but denies any vomiting.  Denies any abdominal pain.  Personal social history: Resident of [employer].  Reports is widowed with children.  Denies any smoking, drinking or illicit drug use.  **Interim History  Cardiology and infectious disease were consulted for further evaluation.  Cardiology ordered an echo and have signed off the case now.  Infectious disease recommending de-escalating antibiotics to just p.o. vancomycin. She had 1 out of 2 blood cultures of unidentified cocci that may be contaminant per ID they do not recommend restarting IV vancomycin they recommend continuing p.o. oral vancomycin for total 14 days for possible C. difficile colitis.  She has 10 more days remaining and ID feels that her urine and her blood cultures were contaminants.  Palliative care has been consulted for goals of care discussion and she remains a DNR/DNI but after discussion  Patient feels like she benefits from hospitalization and palliative care is now recommending outpatient palliative care team to follow-up when she returns her facility.  Patient seems to be improving and was more alert today than she was when she first came in and appears to be closer to baseline.  Continues to have  significant amount of diarrhea so a Flexi-Seal had been placed and we are going to have [name] cream for her given her continued diarrhea.  Diarrhea is improving.    She was not taking an oral intake as much restarted IV fluid hydration low-dose with D5 half-normal saline but this has now stopped  We will hold off on further fluid until we see how she does with her food.  Calorie count has now initiated and dietitian has now come by and recommending supplements.  Will wait for her calorie count to finish prior to discharge her to ensure that she can take and proper nutrition p.o. to ensure that she does not get dehydrated as she presented to the hospital dehydration and sepsis secondary to C. difficile.  Diarrhea has now improved and essentially resolved  Assessment & Plan:   Principal Problem:   Sepsis ([place]) Active Problems:   Acute renal failure superimposed on chronic kidney disease ([place])   Recurrent UTI   Hypothyroid   Hypertension   Chronic diastolic CHF (congestive heart failure) ([place])   History of pulmonary embolism   History of COVID-19   CKD (chronic kidney disease), stage III   Diarrhea   Macrocytic anemia   GERD (gastroesophageal reflux disease)   Bradycardia   Atherosclerotic peripheral vascular disease (HCC)  Sepsis, poA and likely in the setting of C. difficile colitis, improving  -Question UTI  Vs colitis in the setting of C. Difficile; the Link Snuffer is that she has C. difficile colitis and she is now having Bowel Movements and had a Flexi-Seal but this was removed yesterday  and has had no diarrhea this Shift  -GI pathogen panel is negative -Patient already received 2.5 L in the ED.  Will be careful with IV hydration given history of diastolic heart failure and IV fluid with sodium bicarbonate now stopped but she is having poor oral intake so will resume low dose fluid with D5 1/2 NS at 75 mL/hr but this has now been discontinued  -Sepsis physiology is improving  somewhat -She is positive for the antigen as well as PCR positive but negative for toxin -Meropenem/Flagyl (meropenem does cover anaerobes) initiated in the ED however this will be stopped per ID recommendations -Infectious diseases recommending continue with oral vancomycin for concern for C. difficile -ID consult  this a.m. and Dr. Megan Salon favors continue vancomycin for now stopping meropenem and metronidazole and does not feel that the urine specimen has been to be very helpful given the does not appear to be a catheterized specimen.  He feels that even though her C. difficile toxin is negative she is certainly at risk for C. difficile given her recent hospitalization and antibiotic exposures -Patient's procalcitonin level was 0.87 and is now 0.22 -WBC is elevated and went from 18.4 -> 19.5 and is now improving and is 12.6 yesterday but slightly worsened to 14.0 and is now trending down and is 9.8 and improved  -She had significant amount of diarrhea so Flexi-Seal was placed in Butt cream was ordered but her diarrhea is now resolved -Blood cultures grew 1 out of 2 culture showing unidentified gram-positive cocci in clusters that may be a contaminant and infectious diseases does not recommend starting IV vancomycin again -Urine culture showed Pseudomonas dialysis pansensitive however infectious disease feels that it was not a catheterized specimen and does not feel that it needs to be treated; she does have a Foley catheter in but will discontinue is now -Continue to Follow Cx's and Repeat CBC in the AM  -Continue with oral vancomycin for 10-day course  AKI on CKD stage IIIa, improving Metabolic Acidosis  -BUN and creatinine on admission was 95/2.75 and after fluid hydration she is improved to 46/1.44 today -Patient has a Metabolic Acidosis with a  CO2 of 12, Chloride 117, and AG of 12; now this is improved with a CO2 of 22, anion gap of 6, chloride level of 108 -Foley to be placed in  ED. -Holding torsemide, Lactulose. -IV fluid has now been stopped -Avoid nephrotoxic medications, contrast dyes, hypotension and renally adjust medications  -Continue to monitor and trend renal function repeat CMP in a.m.  Sick sinus syndrome/Sinus Bradycardia -While on stepdown, pt had a sinus pause >3sec. she is now improved and stable for transfer to the medical floor -c/t hold BB.  -Consulted Cardiology and she was asymptomatic and this happened when she was asleep and Cardiology feels that it iwa sin the setting of Sepsis -Cardiology Recommending Avoiding AV nodal blocking agents and no further intervention   Chronic Diastolic CHF -BNP on admission was 155.9 -Echocardiogram was ordered by cardiology and is below -Chest x-ray did show small left pleural effusion -Patient is + 2.574 L since admission -Currently her torsemide is being held and she is given IV fluids as above and this is now stopped -Continue to monitor volume status carefully and she does not appear to be significantly volume overloaded on examination. -Resuming torsemide in the a.m. as diarrhea is improving  Presumed PE -Pt completed 3 months of Eliquis on 01/11/2020.  HTN -Holding hydralazine, Imdur, Toprol-XL for borderline BPs -Also  holding torsemide may resume in the a.m. since her diarrhea is improved -Continue to monitor carefully  Hypokalemia -Patient's potassium was 3.0 and is now improved to 4.4 -Continue to monitor and replete as necessary -Repeat CMP none  Hypothyroidism -C/w Levothyroxine 75 mcg po Daily   GERD -Initially continued her PPI however with concern for C. difficile will discontinue at this time  Hypoalbuminemia and poor po Intake -Her albumin level is now 2.2 -Check CBGs q4h; CBGs ranging from 73-83 -We will consult nutrition for further evaluation recommendations -IVF Hydration as above  Gout -C/w Febuxostat 80 mg po Daily   Normocytic Anemia/Anemia of Chronic Kidney  Disease -The patient's hemoglobin/hematocrit went from 10.9/36.4 is now 9.8/32.5 -Check anemia panel and showed an iron level of 58, U IBC 48, TIBC 106, saturation ratios of 55%, ferritin level 146, folate level 16.6, vitamin B12 of 1340 -Continue to monitor for signs and symptoms of bleeding; currently no overt bleeding noted -Repeat CBC in a.m.  DVT prophylaxis: Heparin 5,000 units sq q8h Code Status: DO NOT RESUSCITATE  Family Communication: No family present at bedside and attempted to call the daughter to give her an updated but she did not pick up the telephone yesterday    Disposition Plan: Pending further improvement and work-up of her colitis.  Her diarrhea resolved however she is not taking much p.o. intake so we have ordered a calorie count and dietitian assistance  Status is: Inpatient  Remains inpatient appropriate because:Unsafe d/c plan and IV treatments appropriate due to intensity of illness or inability to take PO; ordered a calorie count to see how much nutrition she is getting    Dispo: The patient is from: SNF              Anticipated d/c is to: SNF              Anticipated d/c date is: 2 days              Patient currently is not medically stable to d/c.  Consultants:   Infectious Diseases  Cardiology   Palliative Care   Procedures:  ECHOCARDIOGRAM 1. Left ventricular ejection fraction, by estimation, is 60 to 65%. The  left ventricle has normal function. The left ventricle has no regional  wall motion abnormalities. There is mild left ventricular hypertrophy.  Left ventricular diastolic parameters  are consistent with age-related delayed relaxation (normal).  2. Right ventricular systolic function is normal. The right ventricular  size is normal. There is mildly elevated pulmonary artery systolic  pressure.  3. Nodular calcification of the anterior leaflet tip. The mitral valve is  degenerative. Trivial mitral valve regurgitation. No evidence of  mitral  stenosis.  4. The aortic valve is tricuspid. Aortic valve regurgitation is not  visualized. Mild aortic valve stenosis.  5. The inferior vena cava is dilated in size with >50% respiratory  variability, suggesting right atrial pressure of 8 mmHg.   FINDINGS  Left Ventricle: Left ventricular ejection fraction, by estimation, is 60  to 65%. The left ventricle has normal function. The left ventricle has no  regional wall motion abnormalities. The left ventricular internal cavity  size was normal in size. There is  mild left ventricular hypertrophy. Left ventricular diastolic parameters  are consistent with age-related delayed relaxation (normal).   Right Ventricle: The right ventricular size is normal. No increase in  right ventricular wall thickness. Right ventricular systolic function is  normal. There is mildly elevated pulmonary artery systolic pressure. The  tricuspid regurgitant velocity is 2.76  m/s, and with an assumed right atrial pressure of 8 mmHg, the estimated  right ventricular systolic pressure is 27.2 mmHg.   Left Atrium: Left atrial size was normal in size.   Right Atrium: Right atrial size was normal in size.   Pericardium: There is no evidence of pericardial effusion.   Mitral Valve: Nodular calcification of the anterior leaflet tip. The  mitral valve is degenerative in appearance. There is moderate thickening  of the mitral valve leaflet(s). There is moderate calcification of the  mitral valve leaflet(s). Normal mobility  of the mitral valve leaflets. Moderate mitral annular calcification.  Trivial mitral valve regurgitation. No evidence of mitral valve stenosis.  MV peak gradient, 9.9 mmHg. The mean mitral valve gradient is 3.0 mmHg.   Tricuspid Valve: The tricuspid valve is normal in structure. Tricuspid  valve regurgitation is trivial. No evidence of tricuspid stenosis.   Aortic Valve: The aortic valve is tricuspid. . There is moderate    thickening and moderate calcification of the aortic valve. Aortic valve  regurgitation is not visualized. Mild aortic stenosis is present. There is  moderate thickening of the aortic valve.  There is moderate calcification of the aortic valve. Aortic valve mean  gradient measures 11.0 mmHg. Aortic valve peak gradient measures 22.2  mmHg. Aortic valve area, by VTI measures 1.23 cm.   Pulmonic Valve: The pulmonic valve was normal in structure. Pulmonic valve  regurgitation is not visualized. No evidence of pulmonic stenosis.   Aorta: The aortic root is normal in size and structure.   Venous: The inferior vena cava is dilated in size with greater than 50%  respiratory variability, suggesting right atrial pressure of 8 mmHg.   IAS/Shunts: No atrial level shunt detected by color flow Doppler.     LEFT VENTRICLE  PLAX 2D  LVIDd:     3.54 cm   Diastology  LVIDs:     1.95 cm   LV e' lateral:  7.02 cm/s  LV PW:     1.42 cm   LV E/e' lateral: 13.8  LV IVS:    1.16 cm   LV e' medial:  4.60 cm/s  LVOT diam:   1.70 cm   LV E/e' medial: 21.0  LV SV:     48  LV SV Index:  29  LVOT Area:   2.27 cm    LV Volumes (MOD)  LV vol d, MOD A2C: 76.7 ml  LV vol d, MOD A4C: 52.5 ml  LV vol s, MOD A2C: 22.4 ml  LV vol s, MOD A4C: 18.4 ml  LV SV MOD A2C:   54.3 ml  LV SV MOD A4C:   52.5 ml  LV SV MOD BP:   41.8 ml   RIGHT VENTRICLE       IVC  RV S prime:   20.80 cm/s IVC diam: 2.22 cm  TAPSE (M-mode): 2.3 cm   LEFT ATRIUM      Index    RIGHT ATRIUM      Index  LA diam:   3.45 cm 2.09 cm/m RA Area:   12.60 cm  LA Vol (A2C): 33.9 ml 20.53 ml/m RA Volume:  31.10 ml 18.83 ml/m  LA Vol (A4C): 36.1 ml 21.86 ml/m  AORTIC VALVE  AV Area (Vmax):  1.20 cm  AV Area (Vmean):  1.30 cm  AV Area (VTI):   1.23 cm  AV Vmax:      235.50 cm/s  AV Vmean:  152.500 cm/s  AV VTI:      0.392 m  AV Peak  Grad:   22.2 mmHg  AV Mean Grad:   11.0 mmHg  LVOT Vmax:     125.00 cm/s  LVOT Vmean:    87.500 cm/s  LVOT VTI:     0.212 m  LVOT/AV VTI ratio: 0.54    AORTA  Ao Root diam: 2.90 cm   MITRAL VALVE        TRICUSPID VALVE  MV Area (PHT): 5.02 cm   TR Peak grad:  30.5 mmHg  MV Peak grad: 9.9 mmHg   TR Vmax:    276.00 cm/s  MV Mean grad: 3.0 mmHg  MV Vmax:    1.57 m/s   SHUNTS  MV Vmean:   69.7 cm/s  Systemic VTI: 0.21 m  MV Decel Time: 151 msec   Systemic Diam: 1.70 cm  MV E velocity: 96.70 cm/s  MV A velocity: 158.00 cm/s  MV E/A ratio: 0.61   Antimicrobials:  Anti-infectives (From admission, onward)   Start     Dose/Rate Route Frequency Ordered Stop   01/20/20 1000  vancomycin (VANCOCIN) 50 mg/mL oral solution 125 mg     125 mg Oral 4 times daily 01/20/20 0741 01/30/20 0959   01/20/20 0600  meropenem (MERREM) 500 mg in sodium chloride 0.9 % 100 mL IVPB  Status:  Discontinued     500 mg 200 mL/hr over 30 Minutes Intravenous Every 12 hours 01/19/20 2104 01/20/20 1519   01/20/20 0200  meropenem (MERREM) 1 g in sodium chloride 0.9 % 100 mL IVPB  Status:  Discontinued     1 g 200 mL/hr over 30 Minutes Intravenous Every 8 hours 01/19/20 2002 01/19/20 2104   01/20/20 0200  metroNIDAZOLE (FLAGYL) IVPB 500 mg  Status:  Discontinued     500 mg 100 mL/hr over 60 Minutes Intravenous Every 8 hours 01/19/20 2002 01/20/20 1519   01/19/20 1800  metroNIDAZOLE (FLAGYL) IVPB 500 mg     500 mg 100 mL/hr over 60 Minutes Intravenous  Once 01/19/20 1753 01/19/20 1921   01/19/20 1700  meropenem (MERREM) 1 g in sodium chloride 0.9 % 100 mL IVPB     1 g 200 mL/hr over 30 Minutes Intravenous  Once 01/19/20 1634 01/19/20 1808     Subjective: Seen and examined at bedside and and she remains extremely hard of hearing but thinks she is doing okay.  Nursing states that she is just been taking bites of her food and not finishing.  Denies any  lightheadedness or dizziness.  Has not had any bowel movement.  No chest pain, lightheadedness or dizziness.  Feels okay and wanting her pillow adjusted.  Objective: Vitals:   01/25/20 2142 01/26/20 0550 01/26/20 1303 01/26/20 1600  BP: (!) 126/57 (!) 131/56 135/69   Pulse: 88 89 (!) 105   Resp: 18 20 18    Temp: 99.1 F (37.3 C) 99.3 F (37.4 C) 98.9 F (37.2 C)   TempSrc: Oral Oral Oral   SpO2: 96% 95% 92%   Weight:    65.7 kg  Height:        Intake/Output Summary (Last 24 hours) at 01/26/2020 1821 Last data filed at 01/26/2020 1314 Gross per 24 hour  Intake 500 ml  Output 300 ml  Net 200 ml   Filed Weights   01/19/20 2139 01/26/20 1600  Weight: 62.6 kg 65.7 kg   Examination: Physical Exam:  Constitutional: The patient is a thin elderly Caucasian female  currently in no acute distress appears calm but does seem a little uncomfortable Eyes: Lids and conjunctivae normal, sclerae anicteric  ENMT: External Ears, Nose appear normal.  She is extremely hard of hearing Neck: Appears normal, supple, no cervical masses, normal ROM, no appreciable thyromegaly; no JVD Respiratory: Diminished to auscultation bilaterally, no wheezing, rales, rhonchi or crackles. Normal respiratory effort and patient is not tachypenic. No accessory muscle use.  Unlabored breathing Cardiovascular: RRR, 2 out of 6 systolic murmur.  She has lower extremity edema but she did not do me palpated Abdomen: Soft, non-tender, slightly distended secondary to body habitus. Bowel sounds positive.  GU: Deferred. Musculoskeletal: No clubbing / cyanosis of digits/nails. No joint deformity upper and lower extremities.  Skin: No rashes, lesions, ulcers on limited skin evaluation. No induration; Warm and dry.  Neurologic: CN 2-12 grossly intact with no focal deficits. Romberg sign cerebellar reflexes not assessed.  Psychiatric: Normal judgment and insight. Alert and oriented x 3. Normal mood and appropriate affect.   Data  Reviewed: I have personally reviewed following labs and imaging studies  CBC: Recent Labs  Lab 01/22/20 0239 01/23/20 0309 01/24/20 0402 01/25/20 0414 01/26/20 0416  WBC 12.6* 14.0* 11.3* 9.7 9.8  NEUTROABS 9.3* 9.6* 7.9* 6.7 6.7  HGB 10.5* 10.4* 9.8* 9.3* 9.8*  HCT 33.4* 33.6* 32.6* 30.3* 32.5*  MCV 98.2 100.3* 100.9* 101.7* 101.2*  PLT 288 245 265 331 333   Basic Metabolic Panel: Recent Labs  Lab 01/22/20 0239 01/23/20 0309 01/24/20 0402 01/25/20 0414 01/26/20 0416  NA 140 135 137 136 136  K 4.3 4.3 4.3 4.3 4.4  CL 109 105 106 107 108  CO2 25 25 24 25 22   GLUCOSE 92 75 110* 87 108*  BUN 66* 55* 47* 46* 41*  CREATININE 1.75* 1.58* 1.56* 1.44* 1.57*  CALCIUM 8.3* 8.2* 8.2* 8.1* 8.3*  MG 2.3 2.2 1.9 1.8 1.9  PHOS 2.6 2.6 2.8 3.0 3.4   GFR: Estimated Creatinine Clearance: 21.3 mL/min (A) (by C-G formula based on SCr of 1.57 mg/dL (H)). Liver Function Tests: Recent Labs  Lab 01/22/20 0239 01/23/20 0309 01/24/20 0402 01/25/20 0414 01/26/20 0416  AST 15 16 16 16 20   ALT 11 13 11 11 14   ALKPHOS 81 95 92 86 92  BILITOT 0.3 0.2* 0.3 0.1* 0.3  PROT 5.2* 5.4* 4.9* 4.7* 5.0*  ALBUMIN 2.1* 2.2* 2.0* 1.9* 2.0*   No results for input(s): LIPASE, AMYLASE in the last 168 hours. No results for input(s): AMMONIA in the last 168 hours. Coagulation Profile: No results for input(s): INR, PROTIME in the last 168 hours. Cardiac Enzymes: No results for input(s): CKTOTAL, CKMB, CKMBINDEX, TROPONINI in the last 168 hours. BNP (last 3 results) No results for input(s): PROBNP in the last 8760 hours. HbA1C: No results for input(s): HGBA1C in the last 72 hours. CBG: Recent Labs  Lab 01/25/20 2027 01/26/20 0010 01/26/20 0751 01/26/20 1156 01/26/20 1647  GLUCAP 81 83 81 73 75   Lipid Profile: No results for input(s): CHOL, HDL, LDLCALC, TRIG, CHOLHDL, LDLDIRECT in the last 72 hours. Thyroid Function Tests: No results for input(s): TSH, T4TOTAL, FREET4, T3FREE, THYROIDAB in  the last 72 hours. Anemia Panel: Recent Labs    01/26/20 0416  VITAMINB12 1,340*  FOLATE 16.6  FERRITIN 146  TIBC 106*  IRON 58  RETICCTPCT 0.8   Sepsis Labs: Recent Labs  Lab 01/19/20 1825 01/20/20 0212 01/21/20 0259  PROCALCITON  --  0.20 0.22  LATICACIDVEN 0.6  --   --  Recent Results (from the past 240 hour(s))  Blood culture (routine x 2)     Status: Abnormal   Collection Time: 01/19/20  4:08 PM   Specimen: BLOOD LEFT FOREARM  Result Value Ref Range Status   Specimen Description   Final    BLOOD LEFT FOREARM Performed at [facility] [address], [place], [place] [address]    Special Requests   Final    BOTTLES DRAWN AEROBIC AND ANAEROBIC Blood Culture adequate volume Performed at [facility] [address], [place], [place] [address]    Culture  Setup Time   Final    ANAEROBIC BOTTLE ONLY GRAM POSITIVE COCCI IN CLUSTERS CRITICAL RESULT CALLED TO, READ BACK BY AND VERIFIED WITH: J GRIMSLEY [facility] 01/21/20 0455 JDW    Culture (A)  Final    STAPHYLOCOCCUS AURICULARIS THE SIGNIFICANCE OF ISOLATING THIS ORGANISM FROM A SINGLE SET OF BLOOD CULTURES WHEN MULTIPLE SETS ARE DRAWN IS UNCERTAIN. PLEASE NOTIFY THE MICROBIOLOGY DEPARTMENT WITHIN ONE WEEK IF SPECIATION AND SENSITIVITIES ARE REQUIRED. Performed at [facility], [place] [address] [place], [place] [address]    Report Status 01/23/2020 FINAL  Final  Blood Culture ID Panel (Reflexed)     Status: None   Collection Time: 01/19/20  4:08 PM  Result Value Ref Range Status   Enterococcus species NOT DETECTED NOT DETECTED Final   Listeria monocytogenes NOT DETECTED NOT DETECTED Final   Staphylococcus species NOT DETECTED NOT DETECTED Final   Staphylococcus aureus (BCID) NOT DETECTED NOT DETECTED Final   Streptococcus species NOT DETECTED NOT DETECTED Final   Streptococcus agalactiae NOT DETECTED NOT DETECTED Final   Streptococcus pneumoniae NOT DETECTED NOT DETECTED  Final   Streptococcus pyogenes NOT DETECTED NOT DETECTED Final   Acinetobacter baumannii NOT DETECTED NOT DETECTED Final   Enterobacteriaceae species NOT DETECTED NOT DETECTED Final   Enterobacter cloacae complex NOT DETECTED NOT DETECTED Final   Escherichia coli NOT DETECTED NOT DETECTED Final   Klebsiella oxytoca NOT DETECTED NOT DETECTED Final   Klebsiella pneumoniae NOT DETECTED NOT DETECTED Final   Proteus species NOT DETECTED NOT DETECTED Final   Serratia marcescens NOT DETECTED NOT DETECTED Final   Haemophilus influenzae NOT DETECTED NOT DETECTED Final   Neisseria meningitidis NOT DETECTED NOT DETECTED Final   Pseudomonas aeruginosa NOT DETECTED NOT DETECTED Final   Candida albicans NOT DETECTED NOT DETECTED Final   Candida glabrata NOT DETECTED NOT DETECTED Final   Candida krusei NOT DETECTED NOT DETECTED Final   Candida parapsilosis NOT DETECTED NOT DETECTED Final   Candida tropicalis NOT DETECTED NOT DETECTED Final    Comment: Performed at [facility], [place] [address], [place], [place] [address]  SARS Coronavirus 2 by RT PCR (hospital order, performed in [facility] hospital lab) Nasopharyngeal Nasopharyngeal Swab     Status: None   Collection Time: 01/19/20  4:23 PM   Specimen: Nasopharyngeal Swab  Result Value Ref Range Status   SARS Coronavirus 2 NEGATIVE NEGATIVE Final    Comment: (NOTE) SARS-CoV-2 target nucleic acids are NOT DETECTED. The SARS-CoV-2 RNA is generally detectable in upper and lower respiratory specimens during the acute phase of infection. The lowest concentration of SARS-CoV-2 viral copies this assay can detect is 250 copies / mL. A negative result does not preclude SARS-CoV-2 infection and should not be used as the sole basis for treatment or other patient management decisions.  A negative result may occur with improper specimen collection / handling, submission of  specimen other than nasopharyngeal swab, presence of viral mutation(s) within  the areas targeted by this assay, and inadequate number of viral copies (<250 copies / mL). A negative result must be combined with clinical observations, patient history, and epidemiological information. Fact Sheet for Patients:   StrictlyIdeas.no Fact Sheet for Healthcare Providers: BankingDealers.co.za This test is not yet approved or cleared  by the [place] FDA and has been authorized for detection and/or diagnosis of SARS-CoV-2 by FDA under an Emergency Use Authorization (EUA).  This EUA will remain in effect (meaning this test can be used) for the duration of the COVID-19 declaration under Section 564(b)(1) of the Act, 21 U.S.C. section 360bbb-3(b)(1), unless the authorization is terminated or revoked sooner. Performed at [facility], [place] [address], [place], [place] [address]   Urine culture     Status: Abnormal   Collection Time: 01/19/20  6:03 PM   Specimen: Urine, Random  Result Value Ref Range Status   Specimen Description   Final    URINE, RANDOM Performed at [facility] [address] [place], [place] [address]    Special Requests   Final    NONE Performed at [facility], [place] [address] [place], [place] [address]    Culture >=100,000 COLONIES/mL PSEUDOMONAS AERUGINOSA (A)  Final   Report Status 01/22/2020 FINAL  Final   Organism ID, Bacteria PSEUDOMONAS AERUGINOSA (A)  Final      Susceptibility   Pseudomonas aeruginosa - MIC*    CEFTAZIDIME <=1 SENSITIVE Sensitive     CIPROFLOXACIN 1 SENSITIVE Sensitive     GENTAMICIN <=1 SENSITIVE Sensitive     IMIPENEM 1 SENSITIVE Sensitive     PIP/TAZO <=4 SENSITIVE Sensitive     CEFEPIME <=1 SENSITIVE Sensitive     * >=100,000 COLONIES/mL PSEUDOMONAS AERUGINOSA  C Difficile Quick Screen w PCR reflex     Status: Abnormal   Collection Time: 01/19/20  6:03 PM   Specimen: Stool  Result Value Ref Range Status   C  Diff antigen POSITIVE (A) NEGATIVE Final   C Diff toxin NEGATIVE NEGATIVE Final   C Diff interpretation Results are indeterminate. See PCR results.  Final    Comment: Performed at [facility], [place] [address] [place], [place] [address]  C. Diff by PCR, Reflexed     Status: Abnormal   Collection Time: 01/19/20  6:03 PM  Result Value Ref Range Status   Toxigenic C. Difficile by PCR POSITIVE (A) NEGATIVE Final    Comment: Positive for toxigenic C. difficile with little to no toxin production. Only treat if clinical presentation suggests symptomatic illness. Performed at [facility], [place] [address], [place], [place] [address]   GI pathogen panel by PCR, stool     Status: None   Collection Time: 01/19/20  6:03 PM  Result Value Ref Range Status   Plesiomonas shigelloides NOT DETECTED NOT DETECTED Final   Yersinia enterocolitica NOT DETECTED NOT DETECTED Final   Vibrio NOT DETECTED NOT DETECTED Final   Enteropathogenic E coli NOT DETECTED NOT DETECTED Final   E coli (ETEC) LT/ST NOT DETECTED NOT DETECTED Final   E coli 1025 by PCR Not applicable NOT DETECTED Final   Cryptosporidium by PCR NOT DETECTED NOT DETECTED Final   Entamoeba histolytica NOT DETECTED NOT DETECTED Final   Adenovirus F 40/41 NOT DETECTED NOT DETECTED Final   Norovirus GI/GII NOT DETECTED NOT DETECTED Final   Sapovirus NOT DETECTED NOT DETECTED Final  Comment: (NOTE) A duplicate report has been generated due to demographic updates. Performed At: [facility] [place], [place] [id number] Rush Farmer MD YW:[id number]    Vibrio cholerae NOT DETECTED NOT DETECTED Final   Campylobacter by PCR NOT DETECTED NOT DETECTED Final   Salmonella by PCR NOT DETECTED NOT DETECTED Final   E coli (STEC) NOT DETECTED NOT DETECTED Final   Enteroaggregative E coli NOT DETECTED NOT DETECTED Final   Shigella by PCR NOT DETECTED NOT DETECTED Final   Cyclospora cayetanensis NOT DETECTED  NOT DETECTED Final   Astrovirus NOT DETECTED NOT DETECTED Final   G lamblia by PCR NOT DETECTED NOT DETECTED Final   Rotavirus A by PCR NOT DETECTED NOT DETECTED Final  MRSA PCR Screening     Status: None   Collection Time: 01/19/20  9:45 PM   Specimen: Nasopharyngeal  Result Value Ref Range Status   MRSA by PCR NEGATIVE NEGATIVE Final    Comment:        The GeneXpert MRSA Assay (FDA approved for NASAL specimens only), is one component of a comprehensive MRSA colonization surveillance program. It is not intended to diagnose MRSA infection nor to guide or monitor treatment for MRSA infections. Performed at [facility], [place] [address], [place], [place] [address]   Blood culture (routine x 2)     Status: None   Collection Time: 01/19/20 10:07 PM   Specimen: BLOOD LEFT ARM  Result Value Ref Range Status   Specimen Description   Final    BLOOD LEFT ARM Performed at [place] [address] [place], [place] [address]    Special Requests   Final    BOTTLES DRAWN AEROBIC ONLY Blood Culture adequate volume Performed at [place] [address] [place], [place] [address]    Culture   Final    NO GROWTH 5 DAYS Performed at [facility], [place] [address] [place], [place] [address]    Report Status 01/24/2020 FINAL  Final     RN Pressure Injury Documentation: Pressure Injury 09/21/19 Buttocks Left Stage 2 -  Partial thickness loss of dermis presenting as a shallow open injury with a red, pink wound bed without slough. (Active)  09/21/19 1600  Location: Buttocks  Location Orientation: Left  Staging: Stage 2 -  Partial thickness loss of dermis presenting as a shallow open injury with a red, pink wound bed without slough.  Wound Description (Comments):   Present on Admission: Yes     Pressure Injury 09/21/19 Buttocks Right Stage 1 -  Intact skin with non-blanchable redness of a localized area usually over a bony  prominence. (Active)  09/21/19 1600  Location: Buttocks  Location Orientation: Right  Staging: Stage 1 -  Intact skin with non-blanchable redness of a localized area usually over a bony prominence.  Wound Description (Comments):   Present on Admission: Yes   Interventions: Magic cup, Boost Breeze, MVI, Liberalize Diet Radiology Studies: No results found. Scheduled Meds: . acidophilus  2 capsule Oral BID  . Chlorhexidine Gluconate Cloth  6 each Topical Q0600  . febuxostat  80 mg Oral Daily  . feeding supplement  1 Container Oral BID BM  . heparin  5,000 Units Subcutaneous Q8H  . HYDROcodone-acetaminophen  1 tablet Oral TID  . levothyroxine  75 mcg Oral Q0600  . LORazepam  0.5 mg Oral QPC lunch  . mouth rinse  15 mL Mouth Rinse BID  . multivitamin with  minerals  1 tablet Oral Daily  . traZODone  50 mg Oral QHS  . vancomycin  125 mg Oral QID   Continuous Infusions:   LOS: 7 days   Kerney Elbe, DO [facility] PAGER is on AMION  If 7PM-7AM, please contact night-coverage [email or web address]

## 2020-01-26 NOTE — Care Management Important Message (Signed)
Important Message  Patient Details IM Letter given to Gabriel Earing RN Case Manager to present to the Patient Name: Lona Six MRN: [id number] Date of Birth: Jul 26, 1927   Medicare Important Message Given:  Yes     Kerin Salen 01/26/2020, 11:21 AM

## 2020-01-26 NOTE — Progress Notes (Addendum)
Nutrition Follow-up  DOCUMENTATION CODES:   Not applicable  INTERVENTION:  - will d/c Dillard Essex. - will order Boost Breeze BID, each supplement provides 250 kcal and 9 grams of protein. - continue Magic Cup BID. - weigh patient today.  - will liberalize diet from Heart Healthy to Regular (ok with MD). - will follow-up for Day #1 results of Calorie Count on 5/26.  NUTRITION DIAGNOSIS:   Increased nutrient needs related to acute illness as evidenced by estimated needs. -ongoing  GOAL:   Patient will meet greater than or equal to 90% of their needs -unmet  MONITOR:   PO intake, Supplement acceptance, Labs, Weight trends  REASON FOR ASSESSMENT:   Consult Assessment of nutrition requirement/status, Calorie Count, Poor PO  ASSESSMENT:   86 y.o. female with medical history of HTN, hypothyroidism, GERD, heart failure with associated BLE swelling/edema, CKD, chronic pain, gout, and recurrent UTIs. She was sent to the ED from [facility] with concern for dehydration. Patient is noted to be extremely hard of hearing. Notes indicate patient reported hx of nausea and loose stools but denied vomiting or abdominal pain.  She has not been weighed since 5/18. Patient is very hard of hearing. She reports pain from low chest/upper abdomen down to the bottom of her legs (not including feet). Able to talk with RN after visit in room and she states patient was recently given pain medication and sleeping aid. Patient reported eating part of a sandwich earlier today and RN confirmed stating patient ate about 1/4 of a sandwich and that she requested orange sherbet; this was on bedside table and patient had taken 1 bite.   Patient reports abdominal pain. She states that she has not been feeling hungry and that foods don't taste good/taste the way they usually do. Patient is currently on prolonged course of abx d/t cdiff and abx can cause taste alteration.   Unable to obtain any further information  from patient at this time d/t her difficulty in hearing what RD is saying and her desire for RD to ask RN about pain medication.   Dillard Essex ordered once/day yesterday and patient refused this supplement this AM; bottle still sitting on bedside table.   Able to talk with Dr. Alfredia Ferguson on the phone concerning patient and concern about inadequate nutrition intake. He had attempted to call daughter yesterday but was unsuccessful. It is unknown if family has been visiting patient. It may be beneficial to have a family member who can encourage PO intake or even eat along with patient.   Labs reviewed; CBGs: 81 and 73 mg/dl, BUN: 41 mg/dl, creatinine: 1.57 mg/dl, Ca: 8.3 mg/dl, GFR: 29 ml/min. Medications reviewed; 2 capsules risaquad BID, 75 mcg oral synthroid/day, 1 tablet multivitamin with minerals/day.    NUTRITION - FOCUSED PHYSICAL EXAM:  able to complete to upper body only; no muscle or fat wasting, mild edema to BUE.   Diet Order:   Diet Order            Diet Heart Room service appropriate? Yes; Fluid consistency: Thin  Diet effective now              EDUCATION NEEDS:   Not appropriate for education at this time  Skin:  Skin Assessment: Reviewed RN Assessment  Last BM:  5/25  Height:   Ht Readings from Last 1 Encounters:  01/19/20 5\' 3"  (1.6 m)    Weight:   Wt Readings from Last 1 Encounters:  01/19/20 62.6 kg  Estimated Nutritional Needs:  Kcal:  1400-1600 kcal Protein:  55-65 grasm Fluid:  >/= 1.5 L/day     Jarome Matin, MS, RD, LDN, CNSC Inpatient Clinical Dietitian RD pager # available in AMION  After hours/weekend pager # available in [facility]

## 2020-01-27 DIAGNOSIS — A0472 Enterocolitis due to Clostridium difficile, not specified as recurrent: Secondary | ICD-10-CM

## 2020-01-27 DIAGNOSIS — I5032 Chronic diastolic (congestive) heart failure: Secondary | ICD-10-CM

## 2020-01-27 DIAGNOSIS — N183 Chronic kidney disease, stage 3 unspecified: Secondary | ICD-10-CM

## 2020-01-27 DIAGNOSIS — I10 Essential (primary) hypertension: Secondary | ICD-10-CM

## 2020-01-27 DIAGNOSIS — N184 Chronic kidney disease, stage 4 (severe): Secondary | ICD-10-CM

## 2020-01-27 DIAGNOSIS — Z86711 Personal history of pulmonary embolism: Secondary | ICD-10-CM

## 2020-01-27 DIAGNOSIS — E039 Hypothyroidism, unspecified: Secondary | ICD-10-CM

## 2020-01-27 LAB — GLUCOSE, CAPILLARY
Glucose-Capillary: 71 mg/dL (ref 70–99)
Glucose-Capillary: 77 mg/dL (ref 70–99)
Glucose-Capillary: 79 mg/dL (ref 70–99)
Glucose-Capillary: 84 mg/dL (ref 70–99)

## 2020-01-27 LAB — SARS CORONAVIRUS 2 BY RT PCR (HOSPITAL ORDER, PERFORMED IN ~~LOC~~ HOSPITAL LAB): SARS Coronavirus 2: NEGATIVE

## 2020-01-27 MED ORDER — VANCOMYCIN 50 MG/ML ORAL SOLUTION: 125.0000 mg | Freq: Four times a day (QID) | ORAL | Status: AC

## 2020-01-27 MED ORDER — GERHARDT'S BUTT CREAM: 1.0000 "application " | TOPICAL_CREAM | CUTANEOUS | Status: AC | PRN

## 2020-01-27 NOTE — Progress Notes (Signed)
Calorie Count Note  48 hour calorie count ordered. Day 1 results below  Diet: Heart Healthy for breakfast/lunch, Regular diet for dinner Supplements:  -Boost Breeze po TID, each supplement provides 250 kcal and 9 grams of protein -Magic cup BID with meals, each supplement provides 290 kcal and 9 grams of protein   5/25: Breakfast: 0% -refused Lunch: <10% -negligible amount Dinner: 220 kcals, 2g protein Supplements: 0%  Total intake: 220 kcals kcal (15% of minimum estimated needs)  2g protein protein (3% of minimum estimated needs)  Nutrition Dx: Increased nutrient needs related to acute illness as evidenced by estimated needs  Goal: Pt to meet >/= 90% of their estimated nutrition needs   Intervention:  - Boost Breeze BID, each supplement provides 250 kcal and 9 grams of protein. - Magic Cup BID.  Clayton Bibles, MS, RD, LDN Inpatient Clinical Dietitian Contact information available via Amion

## 2020-01-27 NOTE — TOC Progression Note (Signed)
Transition of Care [facility]) - Progression Note    Patient Details  Name: Krystal Mckenzie MRN: [id number] Date of Birth: October 31, 1926  Transition of Care [facility]) CM/SW Contact  Benjie Ricketson, Juliann Pulse, RN Phone Number: 01/27/2020, 3:48 PM  Clinical Narrative:Faxed w/confirmation to [facility] fax#[phone];spoke to [facility] admissions coordinator-agree for patient to [place] on covid results. Nurse provided w/rm#[phone],tel# for report [phone]. Nurse will call PTAR once ready. No further CM needs.      Expected Discharge Plan: Skilled Nursing Facility([place]) Barriers to Discharge: No Barriers Identified  Expected Discharge Plan and Services Expected Discharge Plan: Skilled Nursing Facility([place])   Discharge Planning Services: CM Consult   Living arrangements for the past 2 months: [place] Expected Discharge Date: 01/27/20                                     Social Determinants of Health (SDOH) Interventions    Readmission Risk Interventions Readmission Risk Prevention Plan 10/08/2019 09/25/2019  Transportation Screening Complete Complete  HRI or Home Care Consult - Not Complete  HRI or Home Care Consult comments - Going to SNF  Social Work Consult for [place] Planning/Counseling - Complete  Palliative Care Screening - Not Applicable  Medication Review Press photographer) Referral to Pharmacy Complete  PCP or Specialist appointment within 3-5 days of discharge Complete -  SW Recovery Care/Counseling Consult Complete -  Palliative Care Screening Complete -  [place] Complete -  Some recent data might be hidden

## 2020-01-27 NOTE — Progress Notes (Signed)
Patient discharge at this time via PTAR including discharge packet. Patient alert and oriented and in no acute distress. VS stable.

## 2020-01-27 NOTE — NC FL2 (Signed)
[place] MEDICAID FL2 LEVEL OF CARE SCREENING TOOL     IDENTIFICATION  Patient Name: Krystal Mckenzie Birthdate: 1926-11-13 Sex: female Admission Date (Current Location): 01/19/2020  [facility] and [place] Number:  Herbalist and Address:  [facility],  [place], [place]      Provider Number: [id number]  Attending Physician Name and Address:  Alma Friendly, MD  Relative Name and Phone Number:  Eben Burow Daughter [phone]  [phone] or Darya, [place] Relative [phone]  [phone]    Current Level of Care: Hospital Recommended Level of Care: [place] Prior Approval Number:    Date Approved/Denied:   PASRR Number: [id number]  Discharge Plan: SNF    Current Diagnoses: Patient Active Problem List   Diagnosis Date Noted  . Diarrhea 01/20/2020  . Macrocytic anemia 01/20/2020  . GERD (gastroesophageal reflux disease) 01/20/2020  . Bradycardia 01/20/2020  . Atherosclerotic peripheral vascular disease ([place]) 01/20/2020  . Sepsis ([place]) 01/19/2020  . CKD (chronic kidney disease), stage III 10/06/2019  . History of COVID-19 09/21/2019  . Acute renal failure superimposed on chronic kidney disease ([place]) 09/20/2019  . Recurrent UTI 09/20/2019  . Hypothyroid   . Hypertension   . Chronic diastolic CHF (congestive heart failure) ([place])   . History of pulmonary embolism     Orientation RESPIRATION BLADDER Height & Weight     Self, Time, Situation, Place  Normal Incontinent Weight: 65.7 kg Height:  5\' 3"  (160 cm)  BEHAVIORAL SYMPTOMS/MOOD NEUROLOGICAL BOWEL NUTRITION STATUS      Incontinent Diet(regular)  AMBULATORY STATUS COMMUNICATION OF NEEDS Skin   Total Care Verbally Other (Comment)(skin tear Abdomen, Groin, Moisture Abdomen, Groin, Buttock)                       Personal Care Assistance Level of Assistance  Bathing, Feeding, Dressing, Total care Bathing Assistance: Maximum assistance Feeding  assistance: Independent Dressing Assistance: Maximum assistance Total Care Assistance: Maximum assistance   Functional Limitations Info  Sight, Hearing, Speech Sight Info: Adequate Hearing Info: Adequate Speech Info: Adequate    SPECIAL CARE FACTORS FREQUENCY  PT (By licensed PT), OT (By licensed OT)     PT Frequency: Eval and Treat OT Frequency: Eval and Treat            Contractures Contractures Info: Not present    Additional Factors Info  Code Status, Allergies Code Status Info: DNR Allergies Info: Morphine And Related, Cephalexin, Penicillins           Current Medications (01/27/2020):  This is the current hospital active medication list Current Facility-Administered Medications  Medication Dose Route Frequency Provider Last Rate Last Admin  . acetaminophen (TYLENOL) tablet 650 mg  650 mg Oral Q6H PRN Mujtaba, Mohammadtokir, MD   650 mg at 01/24/20 0640   Or  . acetaminophen (TYLENOL) suppository 650 mg  650 mg Rectal Q6H PRN Mujtaba, Mohammadtokir, MD      . acetaminophen (TYLENOL) tablet 650 mg  650 mg Oral Q4H PRN Mujtaba, Mohammadtokir, MD   650 mg at 01/26/20 1921  . acidophilus (RISAQUAD) capsule 2 capsule  2 capsule Oral BID Mujtaba, Mohammadtokir, MD   2 capsule at 01/26/20 2129  . Chlorhexidine Gluconate Cloth 2 % PADS 6 each  6 each Topical Q0600 Mujtaba, Mohammadtokir, MD   6 each at 01/27/20 0634  . febuxostat (ULORIC) tablet 80 mg  80 mg Oral Daily Mujtaba, Mohammadtokir, MD   80 mg at 01/26/20 1230  . feeding  supplement (BOOST / RESOURCE BREEZE) liquid 1 Container  1 Container Oral BID BM Sheikh, Omair Dunlap, DO      . [name] cream   Topical PRN Raiford Noble [place], DO   Given at 01/26/20 2134  . heparin injection 5,000 Units  5,000 Units Subcutaneous Q8H Mujtaba, Mohammadtokir, MD   5,000 Units at 01/27/20 2951  . HYDROcodone-acetaminophen (NORCO/VICODIN) 5-325 MG per tablet 1 tablet  1 tablet Oral TID Raiford Noble [place], DO   1 tablet at  01/26/20 2129  . levothyroxine (SYNTHROID) tablet 75 mcg  75 mcg Oral Q0600 Mujtaba, Mohammadtokir, MD   75 mcg at 01/27/20 0634  . LORazepam (ATIVAN) tablet 0.5 mg  0.5 mg Oral QPC lunch Mujtaba, Mohammadtokir, MD   0.5 mg at 01/26/20 1257  . MEDLINE mouth rinse  15 mL Mouth Rinse BID Raiford Noble [place], DO   15 mL at 01/26/20 1301  . multivitamin with minerals tablet 1 tablet  1 tablet Oral Daily Raiford Noble [place], DO   1 tablet at 01/26/20 1230  . ondansetron (ZOFRAN) tablet 4 mg  4 mg Oral Q6H PRN Mujtaba, Mohammadtokir, MD       Or  . ondansetron (ZOFRAN) injection 4 mg  4 mg Intravenous Q6H PRN Mujtaba, Mohammadtokir, MD   4 mg at 01/20/20 0912  . traZODone (DESYREL) tablet 50 mg  50 mg Oral QHS Mujtaba, Mohammadtokir, MD   50 mg at 01/26/20 2129  . vancomycin (VANCOCIN) 50 mg/mL oral solution 125 mg  125 mg Oral QID Raiford Noble [place], DO   125 mg at 01/26/20 8841     Discharge Medications: Please see discharge summary for a list of discharge medications.  Relevant Imaging Results:  Relevant Lab Results:   Additional Information SS#[id number]  Purcell Mouton, RN

## 2020-01-27 NOTE — Progress Notes (Signed)
Report called to [place]

## 2020-01-27 NOTE — TOC Progression Note (Signed)
Transition of Care [facility]) - Progression Note    Patient Details  Name: Krystal Mckenzie MRN: [id number] Date of Birth: [birth year]  Transition of [facility]) CM/SW Contact  Purcell Mouton, RN Phone Number: 01/27/2020, 12:36 PM  Clinical Narrative:    Spoke with pt's daughter Krystal Mckenzie concerning pt going back to SNF. Krystal Mckenzie is agreeable with pt going back to SNF. Pt also agreed to going back to SNF. [facility] was called spoke with Admission Coordinator who asked for COVID test before pt return. Waiting results before pt can discharge to SNF. MD and RN is aware.    Expected Discharge Plan: Skilled Nursing Facility([place]) Barriers to Discharge: Continued Medical Work up  Expected Discharge Plan and Services Expected Discharge Plan: Skilled Nursing Facility([facility])   Discharge Planning Services: CM Consult   Living arrangements for the past 2 months: [place]                                       Social Determinants of Health (SDOH) Interventions    Readmission Risk Interventions Readmission Risk Prevention Plan 10/08/2019 09/25/2019  Transportation Screening Complete Complete  HRI or Home Care Consult - Not Complete  HRI or Home Care Consult comments - Going to SNF  Social Work Consult for [place] Planning/Counseling - Complete  Palliative Care Screening - Not Applicable  Medication Review Press photographer) Referral to Pharmacy Complete  PCP or Specialist appointment within 3-5 days of discharge Complete -  SW Recovery Care/Counseling Consult Complete -  Palliative Care Screening Complete -  [place] Complete -  Some recent data might be hidden

## 2020-01-27 NOTE — Discharge Summary (Signed)
Discharge Summary  Krystal Mckenzie EZM:[id number] DOB: 1927-03-29  PCP: Patient, No Pcp Per  Admit date: 01/19/2020 Discharge date: 01/27/2020  Time spent: 40 mins  Recommendations for Outpatient Follow-up:  1. Follow-up with PCP at SNF  Discharge Diagnoses:  Active Hospital Problems   Diagnosis Date Noted  . Sepsis ([place]) 01/19/2020  . Diarrhea 01/20/2020  . Macrocytic anemia 01/20/2020  . GERD (gastroesophageal reflux disease) 01/20/2020  . Bradycardia 01/20/2020  . Atherosclerotic peripheral vascular disease ([place]) 01/20/2020  . CKD (chronic kidney disease), stage III 10/06/2019  . History of COVID-19 09/21/2019  . Acute renal failure superimposed on chronic kidney disease ([place]) 09/20/2019  . Recurrent UTI 09/20/2019  . Hypothyroid   . Chronic diastolic CHF (congestive heart failure) ([place])   . Hypertension   . History of pulmonary embolism     Resolved Hospital Problems   Diagnosis Date Noted Date Resolved  . Acute UTI 10/07/2019 01/20/2020    Discharge Condition: Stable  Diet recommendation: Regular  Vitals:   01/26/20 2018 01/27/20 0701  BP: 128/60 139/60  Pulse: 97 97  Resp: 20 20  Temp: 98.7 F (37.1 C) 98.3 F (36.8 C)  SpO2: 93% 94%    History of present illness:  Krystal Mckenzie a 86 y.o.femalewithHTN, hypothyroidism, GERD, DHF/LE swelling, CKD baseline Crt1.6, chronic pain, goutand history of recurrent UTIswas sent [facility] with concern for dehydration. Apparently patient has been nauseous and facility got basic labs and KUB to evaluate. Lab work indicated mild worsening of renal function with BUN of 95 and creatinine of 2.59while KUB indicated possible ileus. Patient extremely hard of hearing which is a major impediment to her history. She reports history of nausea and loose stools but denies any vomiting. Denies any abdominal pain.  Patient is a found to have C. difficile colitis.  Patient admitted for further management.   Today,  patient denies any new complaints, diarrhea has now resolved, denies any abdominal pain, vomiting, nausea, fever/chills, chest pain, shortness of breath.  Patient still with poor appetite.  Dietitian was consulted, with recommendations.  Patient will be discharged to SNF.    Hospital Course:  Principal Problem:   Sepsis ([place]) Active Problems:   Acute renal failure superimposed on chronic kidney disease ([place])   Recurrent UTI   Hypothyroid   Hypertension   Chronic diastolic CHF (congestive heart failure) (H[facility])   History of pulmonary embolism   History of COVID-19   CKD (chronic kidney disease), stage III   Diarrhea   Macrocytic anemia   GERD (gastroesophageal reflux disease)   Bradycardia   Atherosclerotic peripheral vascular disease (H[facility])  Sepsis likely 2/2 C. difficile colitis Improved, diarrhea resolved Currently afebrile, with no leukocytosis Stool positive for C. difficile antigen as well as PCR positive, but negative for toxin Of note, blood culture grew 1 out of 2 bottles with unidentified gram-positive cocci, ID does not recommend any IV antibiotics as it may be a contaminant.  Also urine culture showed Pseudomonas pansensitive, however ID does not feel that he needs to be treated and antibiotics for both situations with discontinued ID consulted, recommend continuing p.o. vancomycin for a total of 14 days, last dose should be completed on 02/02/2020 Hold off any further laxatives for now (hold senna, MiraLAX, lactulose)  AKI on CKD stage IIIa Resolved metabolic acidosis Improved BUN and creatinine on admission was 95/2.75 Avoid nephrotoxic medications, contrast dyes, hypotension and renally adjust medications   Sick sinus syndrome/Sinus Bradycardia Cardiology recommending avoiding AV nodal blocking agents and  no further intervention  Discontinue home metoprolol  Chronic Diastolic CHF Appears euvolemic BNP on admission was 155.9 Echocardiogram showed EF of 60 to 65%,  no regional wall motion abnormalities Resume home torsemide  Presumed PE Ptcompleted 3 months ofEliquison 01/11/2020  HTN May restart imdur, amlodipine pending daily BP checks Continue to hold hydralazine, may add on if BP remains uncontrolled  Hypothyroidism C/w Levothyroxine 75 mcg po Daily   Poor oral intake/hypoalbuminemia/failure to thrive Albumin level is 2.2 Dietitian consulted, recommend boost breeze twice daily, Magic cup twice daily Family needs to be involved, to encourage patient to eat, patient needs to be encouraged to eat  Gout C/w Febuxostat 80 mg po Daily   Normocytic Anemia/Anemia of Chronic Kidney Disease The patient's hemoglobin/hematocrit went from 10.9/36.4 is now 9.8/32.5 Anemia panel and showed an iron level of 58, U IBC 48, TIBC 106, saturation ratios of 55%, ferritin level 146, folate level 16.6, vitamin B12 of 1340        Malnutrition Type:  Nutrition Problem: Increased nutrient needs Etiology: acute illness   Malnutrition Characteristics:  Signs/Symptoms: estimated needs   Nutrition Interventions:  Interventions: Magic cup, Boost Breeze, MVI, Liberalize Diet   Estimated body mass index is 25.66 kg/m as calculated from the following:   Height as of this encounter: 5\' 3"  (1.6 m).   Weight as of this encounter: 65.7 kg.    Procedures: None  Consultations:  Infectious disease  Cardiology  Palliative care  Discharge Exam: BP 139/60 (BP Location: Left Arm)   Pulse 97   Temp 98.3 F (36.8 C) (Oral)   Resp 20   Ht 5\' 3"  (1.6 m)   Wt 65.7 kg   SpO2 94%   BMI 25.66 kg/m   General: NAD Cardiovascular: S1, S2 present Respiratory: CTA B  Discharge Instructions You were cared for by a hospitalist during your hospital stay. If you have any questions about your discharge medications or the care you received while you were in the hospital after you are discharged, you can call the unit and asked to speak with the  hospitalist on call if the hospitalist that took care of you is not available. Once you are discharged, your primary care physician will handle any further medical issues. Please note that NO REFILLS for any discharge medications will be authorized once you are discharged, as it is imperative that you return to your primary care physician (or establish a relationship with a primary care physician if you do not have one) for your aftercare needs so that they can reassess your need for medications and monitor your lab values.  Discharge Instructions    Diet - low sodium heart healthy   Complete by: As directed    Increase activity slowly   Complete by: As directed      Allergies as of 01/27/2020      Reactions   Morphine And Related    Cephalexin Hives, Swelling, Rash   Penicillins Swelling, Rash   Did it involve swelling of the face/tongue/throat, SOB, or low BP? Unknown Did it involve sudden or severe rash/hives, skin peeling, or any reaction on the inside of your mouth or nose? Unknown Did you need to seek medical attention at a hospital or doctor's office? Unknown When did it last happen? Unknown If all above answers are "NO", may proceed with cephalosporin use.      Medication List    STOP taking these medications   apixaban 5 MG Tabs tablet Commonly known as: ELIQUIS  clindamycin 150 MG capsule Commonly known as: CLEOCIN   hydrALAZINE 25 MG tablet Commonly known as: APRESOLINE   lactulose 10 GM/15ML solution Commonly known as: CHRONULAC   metoprolol succinate 25 MG 24 hr tablet Commonly known as: TOPROL-XL   polyethylene glycol 17 g packet Commonly known as: MIRALAX / GLYCOLAX   Senna S 8.6-50 MG tablet Generic drug: senna-docusate     TAKE these medications   acetaminophen 325 MG tablet Commonly known as: TYLENOL Take 650 mg by mouth every 4 (four) hours as needed for fever.   albuterol 108 (90 Base) MCG/ACT inhaler Commonly known as: VENTOLIN HFA Inhale 2  puffs into the lungs every 6 (six) hours as needed for wheezing or shortness of breath.   amLODipine 5 MG tablet Commonly known as: NORVASC Take 5 mg by mouth daily.   AZO-CRANBERRY PO Take 1 tablet by mouth 2 (two) times daily. AZO cranberry + Probiotic 250mg -30mg -50 million cell   bisacodyl 10 MG suppository Commonly known as: DULCOLAX Place 10 mg rectally every 12 (twelve) hours as needed for moderate constipation. x3days started 2.1.21   cyanocobalamin 1000 MCG tablet Take 1 tablet (1,000 mcg total) by mouth daily.   DECUBI-VITE PO Take 1 capsule by mouth daily.   Febuxostat 80 MG Tabs Take 80 mg by mouth daily.   [name] cream Crea Apply 1 application topically as needed for irritation.   HYDROcodone-acetaminophen 5-325 MG tablet Commonly known as: NORCO/VICODIN Take 1 tablet by mouth 3 (three) times daily.   isosorbide dinitrate 20 MG tablet Commonly known as: ISORDIL Take 40 mg by mouth 2 (two) times daily.   lactobacillus acidophilus Tabs tablet Take 2 tablets by mouth 2 (two) times daily.   levothyroxine 75 MCG tablet Commonly known as: SYNTHROID Take 75 mcg by mouth daily.   LORazepam 0.5 MG tablet Commonly known as: ATIVAN Take 1 tablet (0.5 mg total) by mouth daily. What changed: when to take this   multivitamin tablet Take 1 tablet by mouth daily.   neomycin-polymyxin b-dexamethasone 3.5-10000-0.1 Oint Commonly known as: MAXITROL Place 1 application into the right eye at bedtime as needed (eye swelling, eye infection).   omeprazole 20 MG capsule Commonly known as: PRILOSEC Take 20 mg by mouth daily.   ondansetron 4 MG disintegrating tablet Commonly known as: ZOFRAN-ODT Take 4 mg by mouth every 8 (eight) hours as needed for nausea or vomiting.   potassium chloride 10 MEQ tablet Commonly known as: KLOR-CON Take 10 mEq by mouth daily.   Preparation H 0.25-14-74.9 % rectal ointment Generic drug: phenylephrine-shark liver oil-mineral  oil-petrolatum Place 1 application rectally 4 (four) times daily as needed for hemorrhoids.   torsemide 10 MG tablet Commonly known as: DEMADEX Take 10 mg by mouth See admin instructions. 10 mg PO five times weekly (Tuesdays, Wednesdays, Fridays, Saturdays, and Sundays)   traZODone 50 MG tablet Commonly known as: DESYREL Take 1 tablet (50 mg total) by mouth at bedtime.   vancomycin 50 mg/mL  oral solution Commonly known as: VANCOCIN Take 2.5 mLs (125 mg total) by mouth 4 (four) times daily for 6 days. Start taking on: Jan 28, 2020   Vitamin D3 50 MCG (2000 UT) Tabs Take 50 mcg by mouth daily.      Allergies  Allergen Reactions  . Morphine And Related   . Cephalexin Hives, Swelling and Rash  . Penicillins Swelling and Rash    Did it involve swelling of the face/tongue/throat, SOB, or low BP? Unknown Did it involve sudden  or severe rash/hives, skin peeling, or any reaction on the inside of your mouth or nose? Unknown Did you need to seek medical attention at a hospital or doctor's office? Unknown When did it last happen? Unknown If all above answers are "NO", may proceed with cephalosporin use.    Contact information for after-discharge care    Destination    [facility] .   Service: Skilled Nursing Contact information: [place] [phone]               The results of significant diagnostics from this hospitalization (including imaging, microbiology, ancillary and laboratory) are listed below for reference.    Significant Diagnostic Studies: CT ABDOMEN PELVIS WO CONTRAST  Result Date: 01/19/2020 CLINICAL DATA:  Abdominal distension EXAM: CT ABDOMEN AND PELVIS WITHOUT CONTRAST TECHNIQUE: Multidetector CT imaging of the abdomen and pelvis was performed following the standard protocol without IV contrast. COMPARISON:  10/06/2019 FINDINGS: Lower chest: Bibasilar scarring. Cardiomegaly. No acute  abnormality. Hepatobiliary: No focal hepatic abnormality. Gallbladder unremarkable. Pancreas: No focal abnormality or ductal dilatation. Spleen: No focal abnormality.  Normal size. Adrenals/Urinary Tract: Punctate nonobstructing bilateral renal stones. No obstruction. No suspicious renal or adrenal lesion on this noncontrast study. Urinary bladder unremarkable. Stomach/Bowel: Colon is fluid-filled with multiple air-fluid levels. Scattered sigmoid diverticula. Stomach and small bowel decompressed. Vascular/Lymphatic: Aortic atherosclerosis. No evidence of aneurysm or adenopathy. Reproductive: Uterus and adnexa unremarkable.  No mass. Other: No free fluid or free air. Musculoskeletal: No acute bony abnormality. Scoliosis and advanced degenerative changes throughout the lumbar spine. IMPRESSION: Fluid-filled colon with air-fluid levels. This is nonspecific. This could reflect gastroenteritis/diarrhea. Scattered sigmoid diverticulosis.  No active diverticulitis. Aortic atherosclerosis. Bilateral nephrolithiasis.  No ureteral stones or hydronephrosis. Electronically Signed   By: Rolm Baptise M.D.   On: 01/19/2020 17:11   DG CHEST PORT 1 VIEW  Result Date: 01/21/2020 CLINICAL DATA:  Shortness of breath. EXAM: PORTABLE CHEST 1 VIEW COMPARISON:  01/19/2020 FINDINGS: Patient slightly rotated to the left as patient's head/chin obscures a portion of the lung apices. Lungs are adequately inflated with persistent left base opacification likely small effusion with atelectasis. Subtle prominence of the perihilar markings suggesting a mild degree of vascular congestion. Cardiomediastinal silhouette and remainder of the exam is unchanged. IMPRESSION: Stable mild left base opacification likely small effusion with atelectasis. Possible component of mild vascular congestion. Electronically Signed   By: Marin Olp M.D.   On: 01/21/2020 08:02   DG Chest Portable 1 View  Result Date: 01/19/2020 CLINICAL DATA:  Dehydration and  constipation. EXAM: PORTABLE CHEST 1 VIEW COMPARISON:  October 06, 2019 FINDINGS: Mild, chronic appearing increased lung markings are seen. There is a small left pleural effusion. No pneumothorax is identified. The heart size and mediastinal contours are within normal limits. There is marked severity calcification of the thoracic aorta. Degenerative changes seen throughout the thoracic spine with evidence of prior vertebroplasty noted at the approximate level of T11. IMPRESSION: Small left pleural effusion. Electronically Signed   By: Virgina Norfolk M.D.   On: 01/19/2020 16:28   ECHOCARDIOGRAM COMPLETE  Result Date: 01/20/2020    ECHOCARDIOGRAM REPORT   Patient Name:   Krystal Mckenzie Date of Exam: 01/20/2020 Medical Rec #:  [id number]      Height:       63.0 in Accession #:    [id number]     Weight:       138.0 lb Date of Birth:  November 28, 1926  BSA:          1.652 m Patient Age:    54 years       BP:           111/42 mmHg Patient Gender: F              HR:           94 bpm. Exam Location:  Inpatient Procedure: 2D Echo, Cardiac Doppler and Color Doppler Indications:    I50.20* Unspecified systolic (congestive) heart failure  History:        Patient has no prior history of Echocardiogram examinations.                 CHF, Abnormal ECG, Signs/Symptoms:Bacteremia; Risk                 Factors:Hypertension. History of pulmonary embolus.  Sonographer:    Roseanna Rainbow RDCS Referring Phys: [id number] [place]  Sonographer Comments: Technically difficult study due to poor echo windows. Image acquisition challenging due to uncooperative patient. Patient uncomfortable during exam. Patient was very sensitive to pressure form probe. IMPRESSIONS  1. Left ventricular ejection fraction, by estimation, is 60 to 65%. The left ventricle has normal function. The left ventricle has no regional wall motion abnormalities. There is mild left ventricular hypertrophy. Left ventricular diastolic parameters are consistent with  age-related delayed relaxation (normal).  2. Right ventricular systolic function is normal. The right ventricular size is normal. There is mildly elevated pulmonary artery systolic pressure.  3. Nodular calcification of the anterior leaflet tip. The mitral valve is degenerative. Trivial mitral valve regurgitation. No evidence of mitral stenosis.  4. The aortic valve is tricuspid. Aortic valve regurgitation is not visualized. Mild aortic valve stenosis.  5. The inferior vena cava is dilated in size with >50% respiratory variability, suggesting right atrial pressure of 8 mmHg. FINDINGS  Left Ventricle: Left ventricular ejection fraction, by estimation, is 60 to 65%. The left ventricle has normal function. The left ventricle has no regional wall motion abnormalities. The left ventricular internal cavity size was normal in size. There is  mild left ventricular hypertrophy. Left ventricular diastolic parameters are consistent with age-related delayed relaxation (normal). Right Ventricle: The right ventricular size is normal. No increase in right ventricular wall thickness. Right ventricular systolic function is normal. There is mildly elevated pulmonary artery systolic pressure. The tricuspid regurgitant velocity is 2.76  m/s, and with an assumed right atrial pressure of 8 mmHg, the estimated right ventricular systolic pressure is 50.0 mmHg. Left Atrium: Left atrial size was normal in size. Right Atrium: Right atrial size was normal in size. Pericardium: There is no evidence of pericardial effusion. Mitral Valve: Nodular calcification of the anterior leaflet tip. The mitral valve is degenerative in appearance. There is moderate thickening of the mitral valve leaflet(s). There is moderate calcification of the mitral valve leaflet(s). Normal mobility of the mitral valve leaflets. Moderate mitral annular calcification. Trivial mitral valve regurgitation. No evidence of mitral valve stenosis. MV peak gradient, 9.9 mmHg. The  mean mitral valve gradient is 3.0 mmHg. Tricuspid Valve: The tricuspid valve is normal in structure. Tricuspid valve regurgitation is trivial. No evidence of tricuspid stenosis. Aortic Valve: The aortic valve is tricuspid. . There is moderate thickening and moderate calcification of the aortic valve. Aortic valve regurgitation is not visualized. Mild aortic stenosis is present. There is moderate thickening of the aortic valve. There is moderate calcification of the aortic valve. Aortic valve mean gradient measures 11.0  mmHg. Aortic valve peak gradient measures 22.2 mmHg. Aortic valve area, by VTI measures 1.23 cm. Pulmonic Valve: The pulmonic valve was normal in structure. Pulmonic valve regurgitation is not visualized. No evidence of pulmonic stenosis. Aorta: The aortic root is normal in size and structure. Venous: The inferior vena cava is dilated in size with greater than 50% respiratory variability, suggesting right atrial pressure of 8 mmHg. IAS/Shunts: No atrial level shunt detected by color flow Doppler.  LEFT VENTRICLE PLAX 2D LVIDd:         3.54 cm     Diastology LVIDs:         1.95 cm     LV e' lateral:   7.02 cm/s LV PW:         1.42 cm     LV E/e' lateral: 13.8 LV IVS:        1.16 cm     LV e' medial:    4.60 cm/s LVOT diam:     1.70 cm     LV E/e' medial:  21.0 LV SV:         48 LV SV Index:   29 LVOT Area:     2.27 cm  LV Volumes (MOD) LV vol d, MOD A2C: 76.7 ml LV vol d, MOD A4C: 52.5 ml LV vol s, MOD A2C: 22.4 ml LV vol s, MOD A4C: 18.4 ml LV SV MOD A2C:     54.3 ml LV SV MOD A4C:     52.5 ml LV SV MOD BP:      41.8 ml RIGHT VENTRICLE             IVC RV S prime:     20.80 cm/s  IVC diam: 2.22 cm TAPSE (M-mode): 2.3 cm LEFT ATRIUM           Index       RIGHT ATRIUM           Index LA diam:      3.45 cm 2.09 cm/m  RA Area:     12.60 cm LA Vol (A2C): 33.9 ml 20.53 ml/m RA Volume:   31.10 ml  18.83 ml/m LA Vol (A4C): 36.1 ml 21.86 ml/m  AORTIC VALVE AV Area (Vmax):    1.20 cm AV Area (Vmean):    1.30 cm AV Area (VTI):     1.23 cm AV Vmax:           235.50 cm/s AV Vmean:          152.500 cm/s AV VTI:            0.392 m AV Peak Grad:      22.2 mmHg AV Mean Grad:      11.0 mmHg LVOT Vmax:         125.00 cm/s LVOT Vmean:        87.500 cm/s LVOT VTI:          0.212 m LVOT/AV VTI ratio: 0.54  AORTA Ao Root diam: 2.90 cm MITRAL VALVE                TRICUSPID VALVE MV Area (PHT): 5.02 cm     TR Peak grad:   30.5 mmHg MV Peak grad:  9.9 mmHg     TR Vmax:        276.00 cm/s MV Mean grad:  3.0 mmHg MV Vmax:       1.57 m/s     SHUNTS MV Vmean:      69.7 cm/s  Systemic VTI:  0.21 m MV Decel Time: 151 msec     Systemic Diam: 1.70 cm MV E velocity: 96.70 cm/s MV A velocity: 158.00 cm/s MV E/A ratio:  0.61 Jenkins Rouge MD Electronically signed by Jenkins Rouge MD Signature Date/Time: 01/20/2020/1:53:13 PM    Final     Microbiology: Recent Results (from the past 240 hour(s))  Blood culture (routine x 2)     Status: Abnormal   Collection Time: 01/19/20  4:08 PM   Specimen: BLOOD LEFT FOREARM  Result Value Ref Range Status   Specimen Description   Final    BLOOD LEFT FOREARM Performed at [facility], [place] [address] [place], [place] [address]    Special Requests   Final    BOTTLES DRAWN AEROBIC AND ANAEROBIC Blood Culture adequate volume Performed at [place] [address] [place], [place] [address]    Culture  Setup Time   Final    ANAEROBIC BOTTLE ONLY GRAM POSITIVE COCCI IN CLUSTERS CRITICAL RESULT CALLED TO, READ BACK BY AND VERIFIED WITH: J GRIMSLEY [facility] 01/21/20 0455 JDW    Culture (A)  Final    STAPHYLOCOCCUS AURICULARIS THE SIGNIFICANCE OF ISOLATING THIS ORGANISM FROM A SINGLE SET OF BLOOD CULTURES WHEN MULTIPLE SETS ARE DRAWN IS UNCERTAIN. PLEASE NOTIFY THE MICROBIOLOGY DEPARTMENT WITHIN ONE WEEK IF SPECIATION AND SENSITIVITIES ARE REQUIRED. Performed at [facility], [place] [address], [place], [place] [address]    Report Status  01/23/2020 FINAL  Final  Blood Culture ID Panel (Reflexed)     Status: None   Collection Time: 01/19/20  4:08 PM  Result Value Ref Range Status   Enterococcus species NOT DETECTED NOT DETECTED Final   Listeria monocytogenes NOT DETECTED NOT DETECTED Final   Staphylococcus species NOT DETECTED NOT DETECTED Final   Staphylococcus aureus (BCID) NOT DETECTED NOT DETECTED Final   Streptococcus species NOT DETECTED NOT DETECTED Final   Streptococcus agalactiae NOT DETECTED NOT DETECTED Final   Streptococcus pneumoniae NOT DETECTED NOT DETECTED Final   Streptococcus pyogenes NOT DETECTED NOT DETECTED Final   Acinetobacter baumannii NOT DETECTED NOT DETECTED Final   Enterobacteriaceae species NOT DETECTED NOT DETECTED Final   Enterobacter cloacae complex NOT DETECTED NOT DETECTED Final   Escherichia coli NOT DETECTED NOT DETECTED Final   Klebsiella oxytoca NOT DETECTED NOT DETECTED Final   Klebsiella pneumoniae NOT DETECTED NOT DETECTED Final   Proteus species NOT DETECTED NOT DETECTED Final   Serratia marcescens NOT DETECTED NOT DETECTED Final   Haemophilus influenzae NOT DETECTED NOT DETECTED Final   Neisseria meningitidis NOT DETECTED NOT DETECTED Final   Pseudomonas aeruginosa NOT DETECTED NOT DETECTED Final   Candida albicans NOT DETECTED NOT DETECTED Final   Candida glabrata NOT DETECTED NOT DETECTED Final   Candida krusei NOT DETECTED NOT DETECTED Final   Candida parapsilosis NOT DETECTED NOT DETECTED Final   Candida tropicalis NOT DETECTED NOT DETECTED Final    Comment: Performed at [facility], [place] [address], [place], [place] [address]  SARS Coronavirus 2 by RT PCR (hospital order, performed in [facility] hospital lab) Nasopharyngeal Nasopharyngeal Swab     Status: None   Collection Time: 01/19/20  4:23 PM   Specimen: Nasopharyngeal Swab  Result Value Ref Range Status   SARS Coronavirus 2 NEGATIVE NEGATIVE Final    Comment: (NOTE) SARS-CoV-2 target nucleic acids are NOT  DETECTED. The SARS-CoV-2 RNA is generally detectable in upper and lower respiratory specimens during the acute phase  of infection. The lowest concentration of SARS-CoV-2 viral copies this assay can detect is 250 copies / mL. A negative result does not preclude SARS-CoV-2 infection and should not be used as the sole basis for treatment or other patient management decisions.  A negative result may occur with improper specimen collection / handling, submission of specimen other than nasopharyngeal swab, presence of viral mutation(s) within the areas targeted by this assay, and inadequate number of viral copies (<250 copies / mL). A negative result must be combined with clinical observations, patient history, and epidemiological information. Fact Sheet for Patients:   StrictlyIdeas.no Fact Sheet for Healthcare Providers: BankingDealers.co.za This test is not yet approved or cleared  by the [place] FDA and has been authorized for detection and/or diagnosis of SARS-CoV-2 by FDA under an Emergency Use Authorization (EUA).  This EUA will remain in effect (meaning this test can be used) for the duration of the COVID-19 declaration under Section 564(b)(1) of the Act, 21 U.S.C. section 360bbb-3(b)(1), unless the authorization is terminated or revoked sooner. Performed at [facility], [place] [address], [place], [place] [address]   Urine culture     Status: Abnormal   Collection Time: 01/19/20  6:03 PM   Specimen: Urine, Random  Result Value Ref Range Status   Specimen Description   Final    URINE, RANDOM Performed at [place] [address], [place], [place] [address]    Special Requests   Final    NONE Performed at [facility], [place] [address] [place], [place] [address]    Culture >=100,000 COLONIES/mL PSEUDOMONAS AERUGINOSA (A)  Final   Report Status 01/22/2020 FINAL  Final     Organism ID, Bacteria PSEUDOMONAS AERUGINOSA (A)  Final      Susceptibility   Pseudomonas aeruginosa - MIC*    CEFTAZIDIME <=1 SENSITIVE Sensitive     CIPROFLOXACIN 1 SENSITIVE Sensitive     GENTAMICIN <=1 SENSITIVE Sensitive     IMIPENEM 1 SENSITIVE Sensitive     PIP/TAZO <=4 SENSITIVE Sensitive     CEFEPIME <=1 SENSITIVE Sensitive     * >=100,000 COLONIES/mL PSEUDOMONAS AERUGINOSA  C Difficile Quick Screen w PCR reflex     Status: Abnormal   Collection Time: 01/19/20  6:03 PM   Specimen: Stool  Result Value Ref Range Status   C Diff antigen POSITIVE (A) NEGATIVE Final   C Diff toxin NEGATIVE NEGATIVE Final   C Diff interpretation Results are indeterminate. See PCR results.  Final    Comment: Performed at [facility], [place] [address], [place], [place] [address]  C. Diff by PCR, Reflexed     Status: Abnormal   Collection Time: 01/19/20  6:03 PM  Result Value Ref Range Status   Toxigenic C. Difficile by PCR POSITIVE (A) NEGATIVE Final    Comment: Positive for toxigenic C. difficile with little to no toxin production. Only treat if clinical presentation suggests symptomatic illness. Performed at [facility], [place] [address] [place], [place] [address]   GI pathogen panel by PCR, stool     Status: None   Collection Time: 01/19/20  6:03 PM  Result Value Ref Range Status   Plesiomonas shigelloides NOT DETECTED NOT DETECTED Final   Yersinia enterocolitica NOT DETECTED NOT DETECTED Final   Vibrio NOT DETECTED NOT DETECTED Final   Enteropathogenic E coli NOT DETECTED NOT DETECTED Final   E coli (ETEC) LT/ST NOT DETECTED NOT DETECTED Final   E  coli 3536 by PCR Not applicable NOT DETECTED Final   Cryptosporidium by PCR NOT DETECTED NOT DETECTED Final   Entamoeba histolytica NOT DETECTED NOT DETECTED Final   Adenovirus F 40/41 NOT DETECTED NOT DETECTED Final   Norovirus GI/GII NOT DETECTED NOT DETECTED Final   Sapovirus NOT DETECTED NOT DETECTED Final     Comment: (NOTE) A duplicate report has been generated due to demographic updates. Performed At: [facility] [place], [place] [id number] Rush Farmer MD QQ:[id number]    Vibrio cholerae NOT DETECTED NOT DETECTED Final   Campylobacter by PCR NOT DETECTED NOT DETECTED Final   Salmonella by PCR NOT DETECTED NOT DETECTED Final   E coli (STEC) NOT DETECTED NOT DETECTED Final   Enteroaggregative E coli NOT DETECTED NOT DETECTED Final   Shigella by PCR NOT DETECTED NOT DETECTED Final   Cyclospora cayetanensis NOT DETECTED NOT DETECTED Final   Astrovirus NOT DETECTED NOT DETECTED Final   G lamblia by PCR NOT DETECTED NOT DETECTED Final   Rotavirus A by PCR NOT DETECTED NOT DETECTED Final  MRSA PCR Screening     Status: None   Collection Time: 01/19/20  9:45 PM   Specimen: Nasopharyngeal  Result Value Ref Range Status   MRSA by PCR NEGATIVE NEGATIVE Final    Comment:        The GeneXpert MRSA Assay (FDA approved for NASAL specimens only), is one component of a comprehensive MRSA colonization surveillance program. It is not intended to diagnose MRSA infection nor to guide or monitor treatment for MRSA infections. Performed at [facility], [place] [address], [place], [place] [address]   Blood culture (routine x 2)     Status: None   Collection Time: 01/19/20 10:07 PM   Specimen: BLOOD LEFT ARM  Result Value Ref Range Status   Specimen Description   Final    BLOOD LEFT ARM Performed at [place] [address], [place], [place] [address]    Special Requests   Final    BOTTLES DRAWN AEROBIC ONLY Blood Culture adequate volume Performed at [place] [address], [place], [place] [address]    Culture   Final    NO GROWTH 5 DAYS Performed at [facility], [place] [address], [place], [place] [address]    Report Status 01/24/2020 FINAL  Final     Labs: Basic Metabolic Panel: Recent Labs    Lab 01/22/20 0239 01/23/20 0309 01/24/20 0402 01/25/20 0414 01/26/20 0416  NA 140 135 137 136 136  K 4.3 4.3 4.3 4.3 4.4  CL 109 105 106 107 108  CO2 25 25 24 25 22   GLUCOSE 92 75 110* 87 108*  BUN 66* 55* 47* 46* 41*  CREATININE 1.75* 1.58* 1.56* 1.44* 1.57*  CALCIUM 8.3* 8.2* 8.2* 8.1* 8.3*  MG 2.3 2.2 1.9 1.8 1.9  PHOS 2.6 2.6 2.8 3.0 3.4   Liver Function Tests: Recent Labs  Lab 01/22/20 0239 01/23/20 0309 01/24/20 0402 01/25/20 0414 01/26/20 0416  AST 15 16 16 16 20   ALT 11 13 11 11 14   ALKPHOS 81 95 92 86 92  BILITOT 0.3 0.2* 0.3 0.1* 0.3  PROT 5.2* 5.4* 4.9* 4.7* 5.0*  ALBUMIN 2.1* 2.2* 2.0* 1.9* 2.0*   No results for input(s): LIPASE, AMYLASE in the last 168 hours. No results for input(s): AMMONIA in the last 168 hours. CBC: Recent Labs  Lab 01/22/20 0239 01/23/20 0309 01/24/20 0402 01/25/20 0414 01/26/20  0416  WBC 12.6* 14.0* 11.3* 9.7 9.8  NEUTROABS 9.3* 9.6* 7.9* 6.7 6.7  HGB 10.5* 10.4* 9.8* 9.3* 9.8*  HCT 33.4* 33.6* 32.6* 30.3* 32.5*  MCV 98.2 100.3* 100.9* 101.7* 101.2*  PLT 288 245 265 331 278   Cardiac Enzymes: No results for input(s): CKTOTAL, CKMB, CKMBINDEX, TROPONINI in the last 168 hours. BNP: BNP (last 3 results) Recent Labs    01/19/20 1454  BNP 155.9*    ProBNP (last 3 results) No results for input(s): PROBNP in the last 8760 hours.  CBG: Recent Labs  Lab 01/26/20 1647 01/26/20 2020 01/27/20 0035 01/27/20 0637 01/27/20 1145  GLUCAP 75 122* 79 84 71       Signed:  Alma Friendly, MD [facility] 01/27/2020, 3:17 PM

## 2020-03-03 DEATH — deceased

## 2021-05-17 IMAGING — DX DG CHEST 1V PORT
1 series · 1 of 1 positions shown · non-contrast
Comparison: Radiograph 05/09/2011

CLINICAL DATA: Shortness of breath, T1CI0-FM positive

EXAM:
PORTABLE CHEST 1 VIEW

[chest ap]
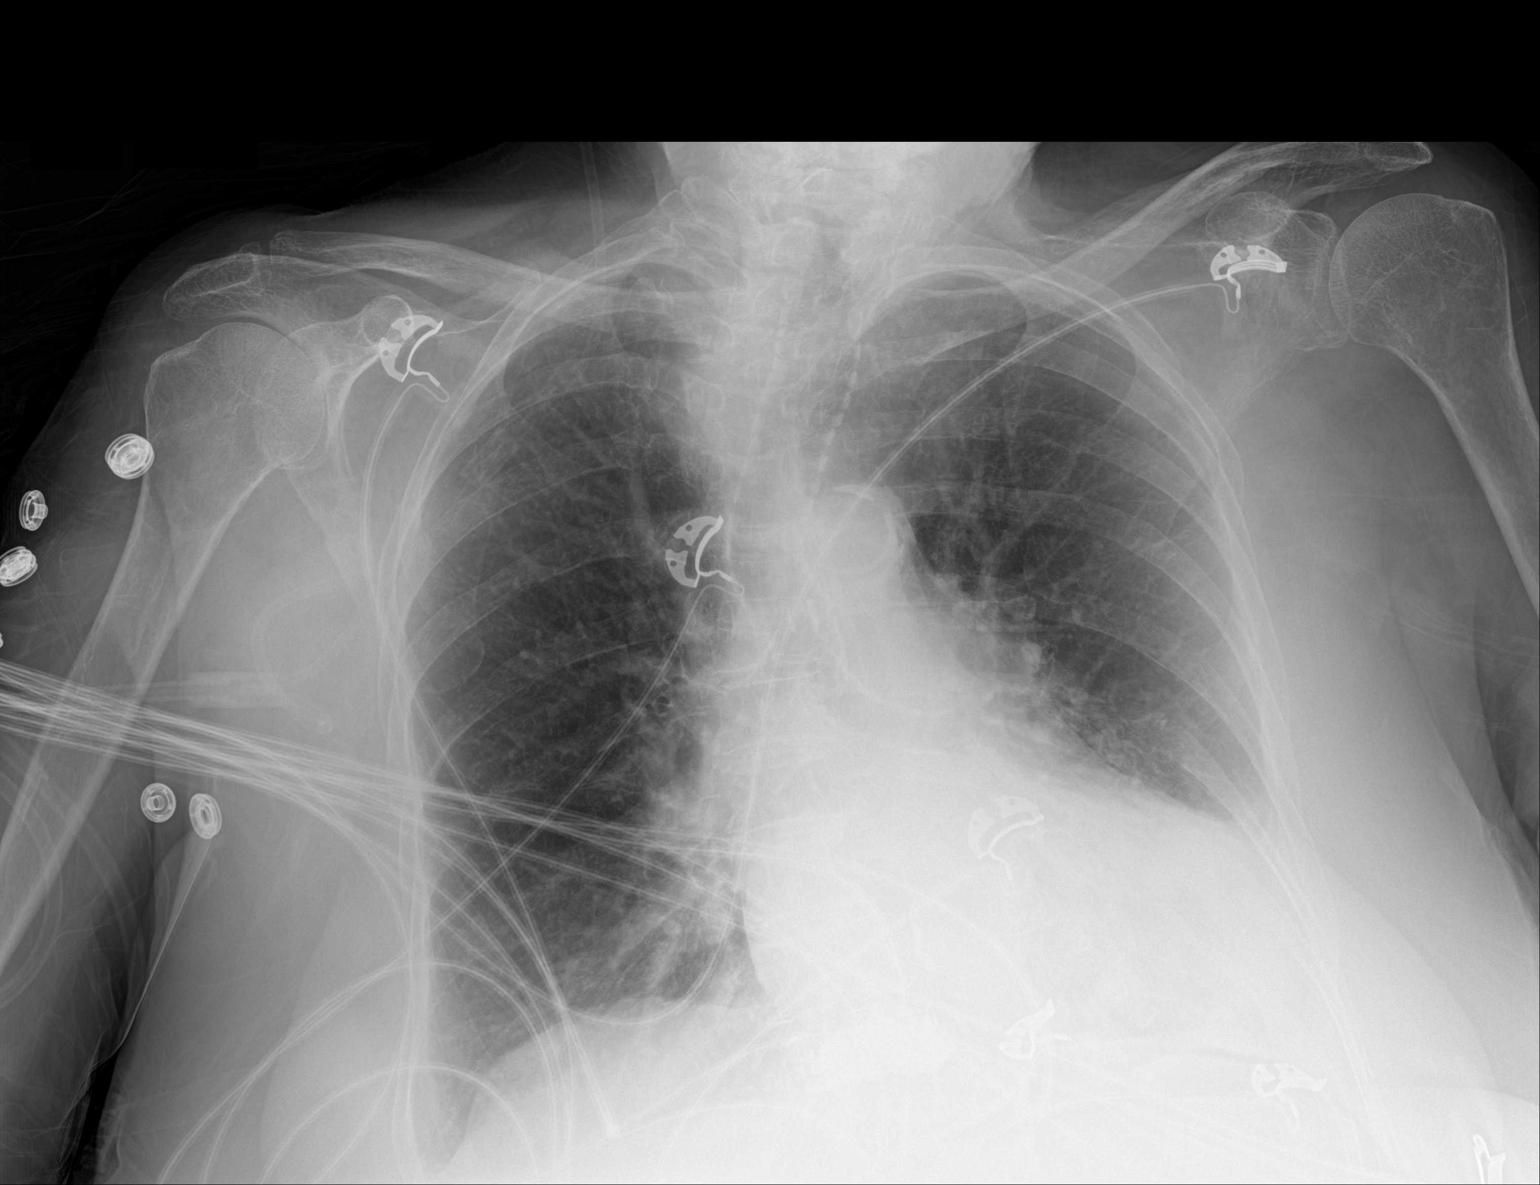

[1 of 1 positions shown; findings below may reference images not displayed]

FINDINGS: Patchy right basilar and dense retrocardiac opacities. Obscuration
of left hemidiaphragm may reflect basilar consolidation or effusion.
No visible pneumothorax. Biapical pleuroparenchymal scarring is
seen. There is some cephalized vascularity with hazy interstitial
opacities suggesting pulmonary edema. Tracheal tortuosity is similar
to prior. The aorta is calcified. The remaining cardiomediastinal
contours are unremarkable. No acute osseous or soft tissue
abnormality. Degenerative changes are present in the imaged spine
and shoulders. Telemetry leads and support devices overlie the
chest.
IMPRESSION: 1. Bibasilar opacities, left greater than right, worrisome for
pneumonia in the setting of T1CI0-FM.
2. Pulmonary vascular congestion and hazy interstitial opacities
suggesting some concomitant edema.
3. Biapical pleuroparenchymal scarring.
4.  Aortic Atherosclerosis ([id number]).

## 2021-05-19 IMAGING — US US RENAL
1 series · 14 of 25 positions shown · non-contrast
Comparison: CT 05/10/2011

CLINICAL DATA: Acute renal failure

EXAM:
RENAL / URINARY TRACT ULTRASOUND COMPLETE

[Series 1: us renal · 14 of 41 slices shown]
[im 1/41]
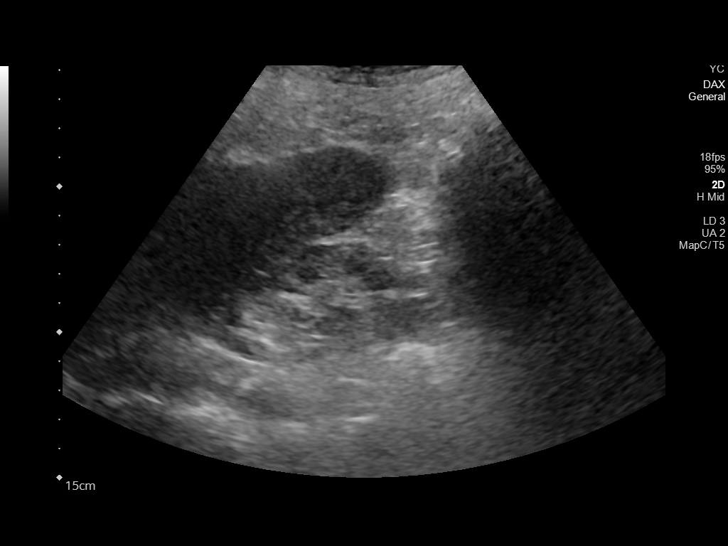
[im 4/41]
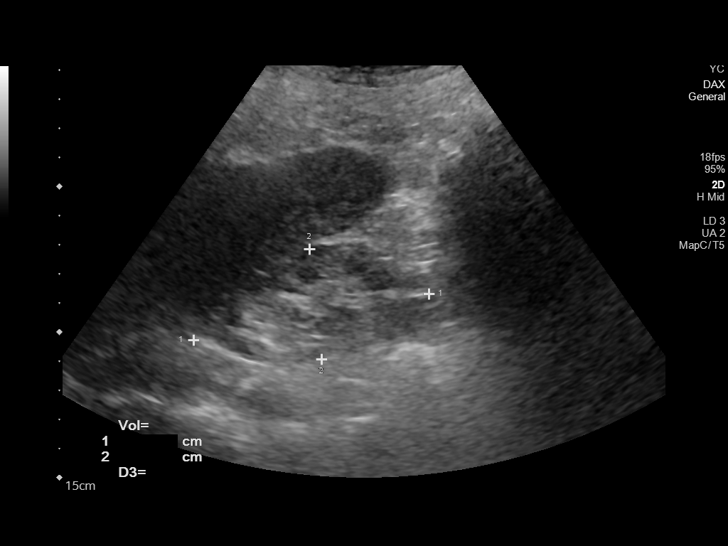
[im 7/41]
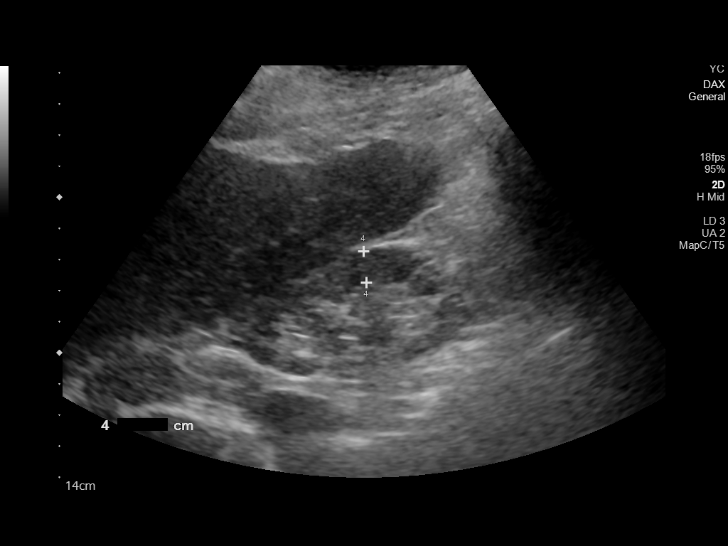
[im 11/41]
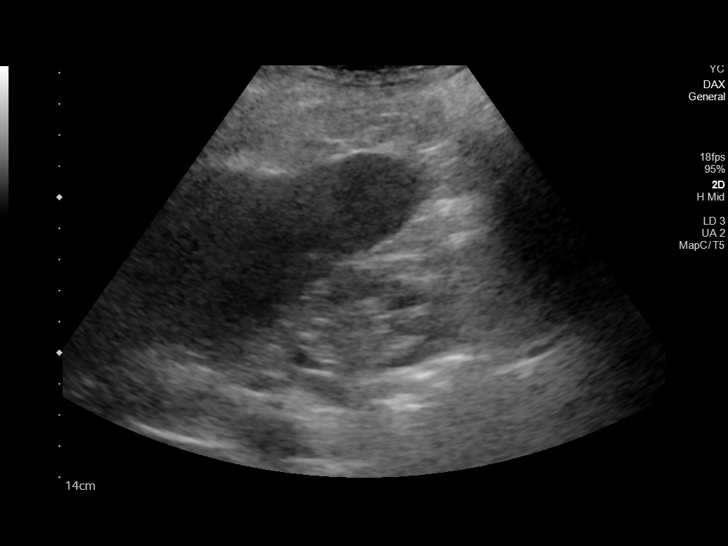
[im 14/41]
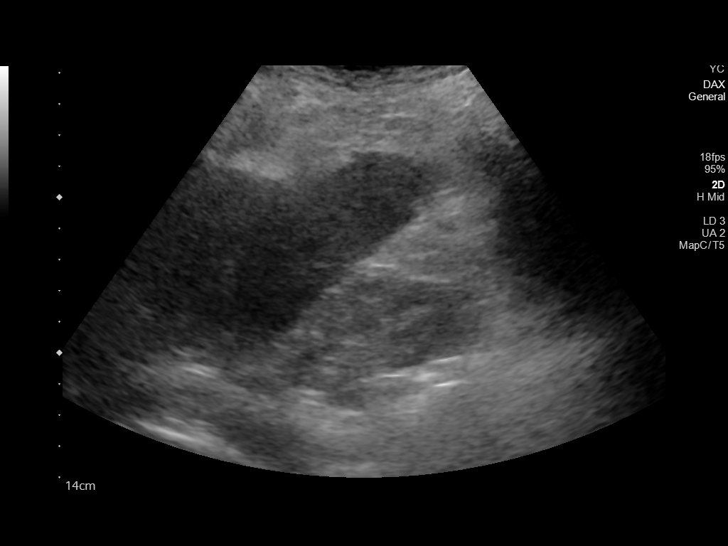
[im 16/41]
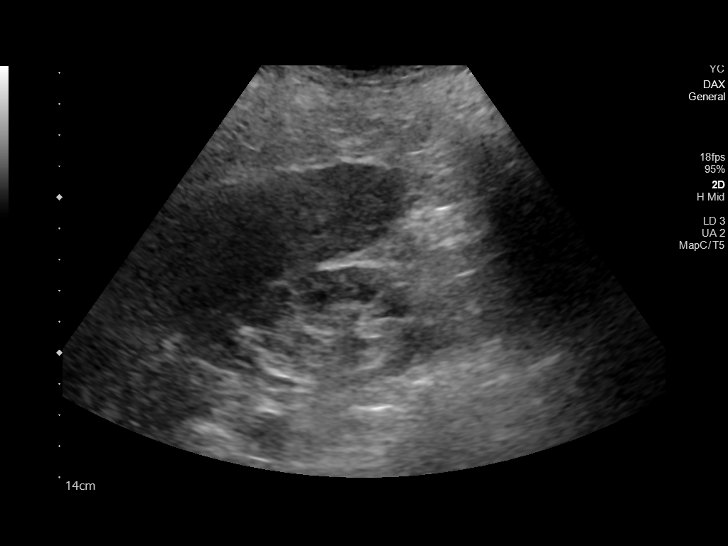
[im 19/41]
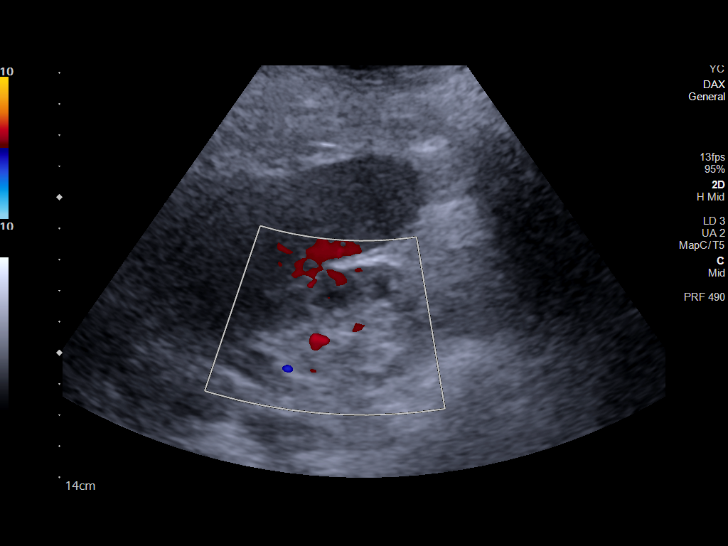
[im 22/41]
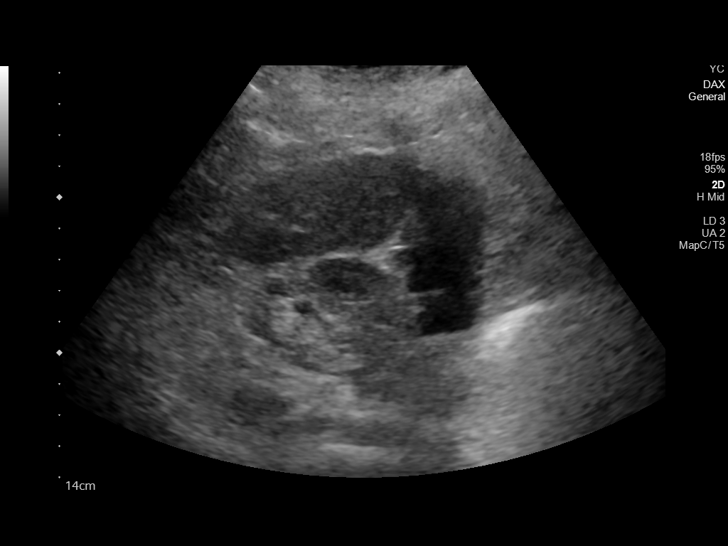
[im 26/41]
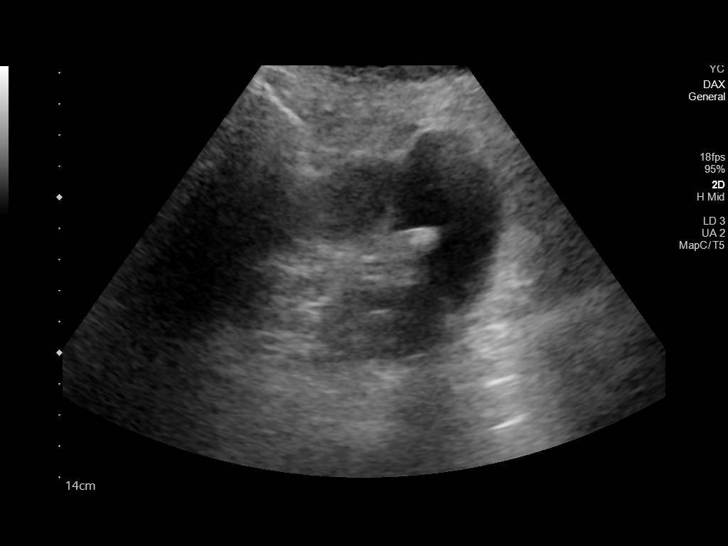
[im 27/41]
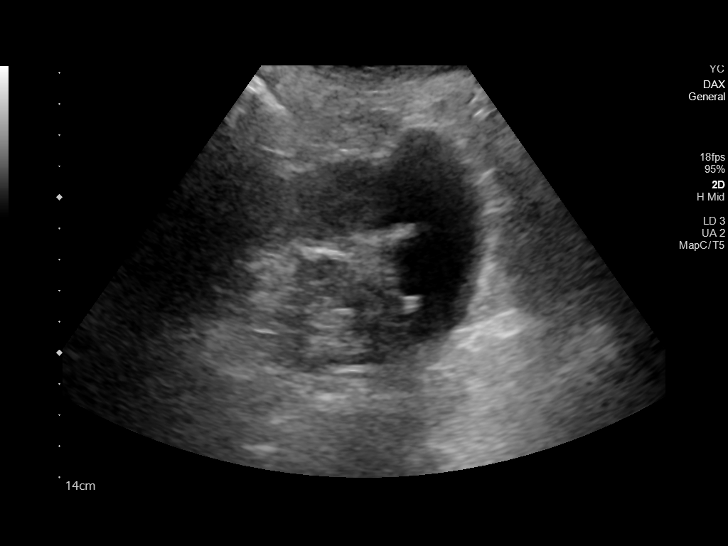
[im 31/41]
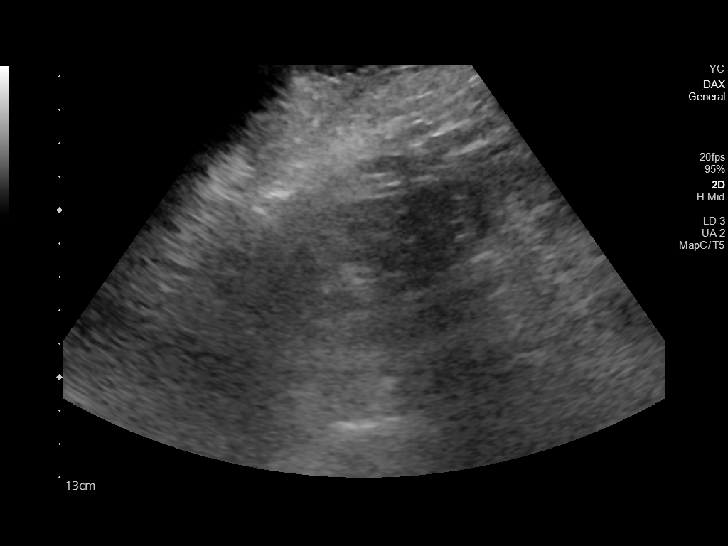
[im 34/41]
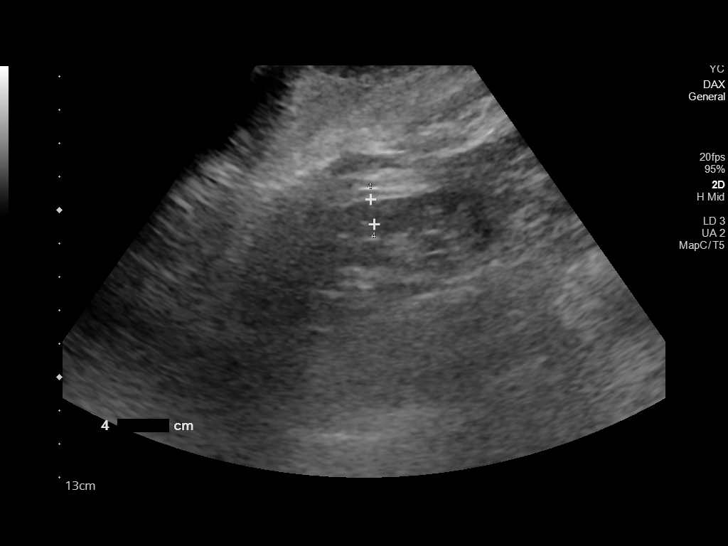
[im 37/41]
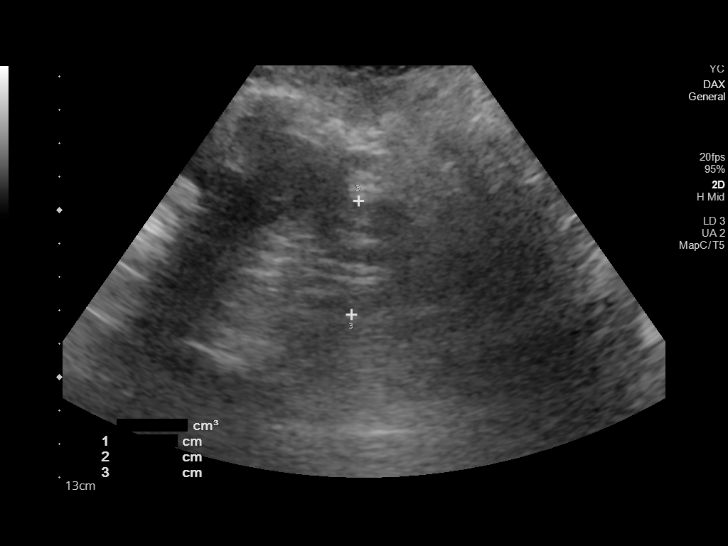
[im 41/41]
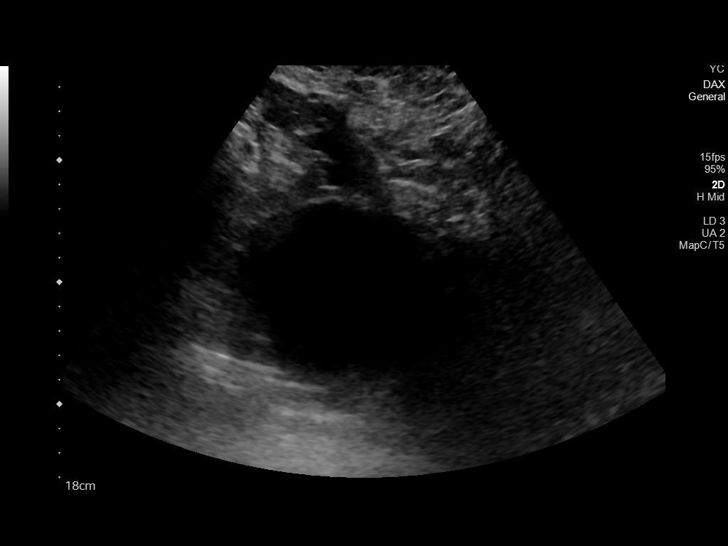

[14 of 25 positions shown; findings below may reference images not displayed]

FINDINGS: Right Kidney:

Renal measurements: 8.2 x 3.8 x 3.9 cm = volume: 64.3 mL. Echogenic
cortex. No mass or hydronephrosis

Left Kidney:

Renal measurements: 7.8 x 3.3 x 3.4 cm = volume: 45.3 mL. Echogenic
cortex. No mass or hydronephrosis.

Bladder:

Appears normal for degree of bladder distention.

Other:

None.
IMPRESSION: Small echogenic kidneys consistent with medical renal disease and
mild atrophy. No hydronephrosis.

## 2021-05-24 IMAGING — DX DG CHEST 1V PORT
1 series · 1 of 1 positions shown · non-contrast
Comparison: Portable exam 3354 hours compared to 09/20/2019

CLINICAL DATA: Hypoxemia, [id number]

EXAM:
PORTABLE CHEST 1 VIEW

[chest]
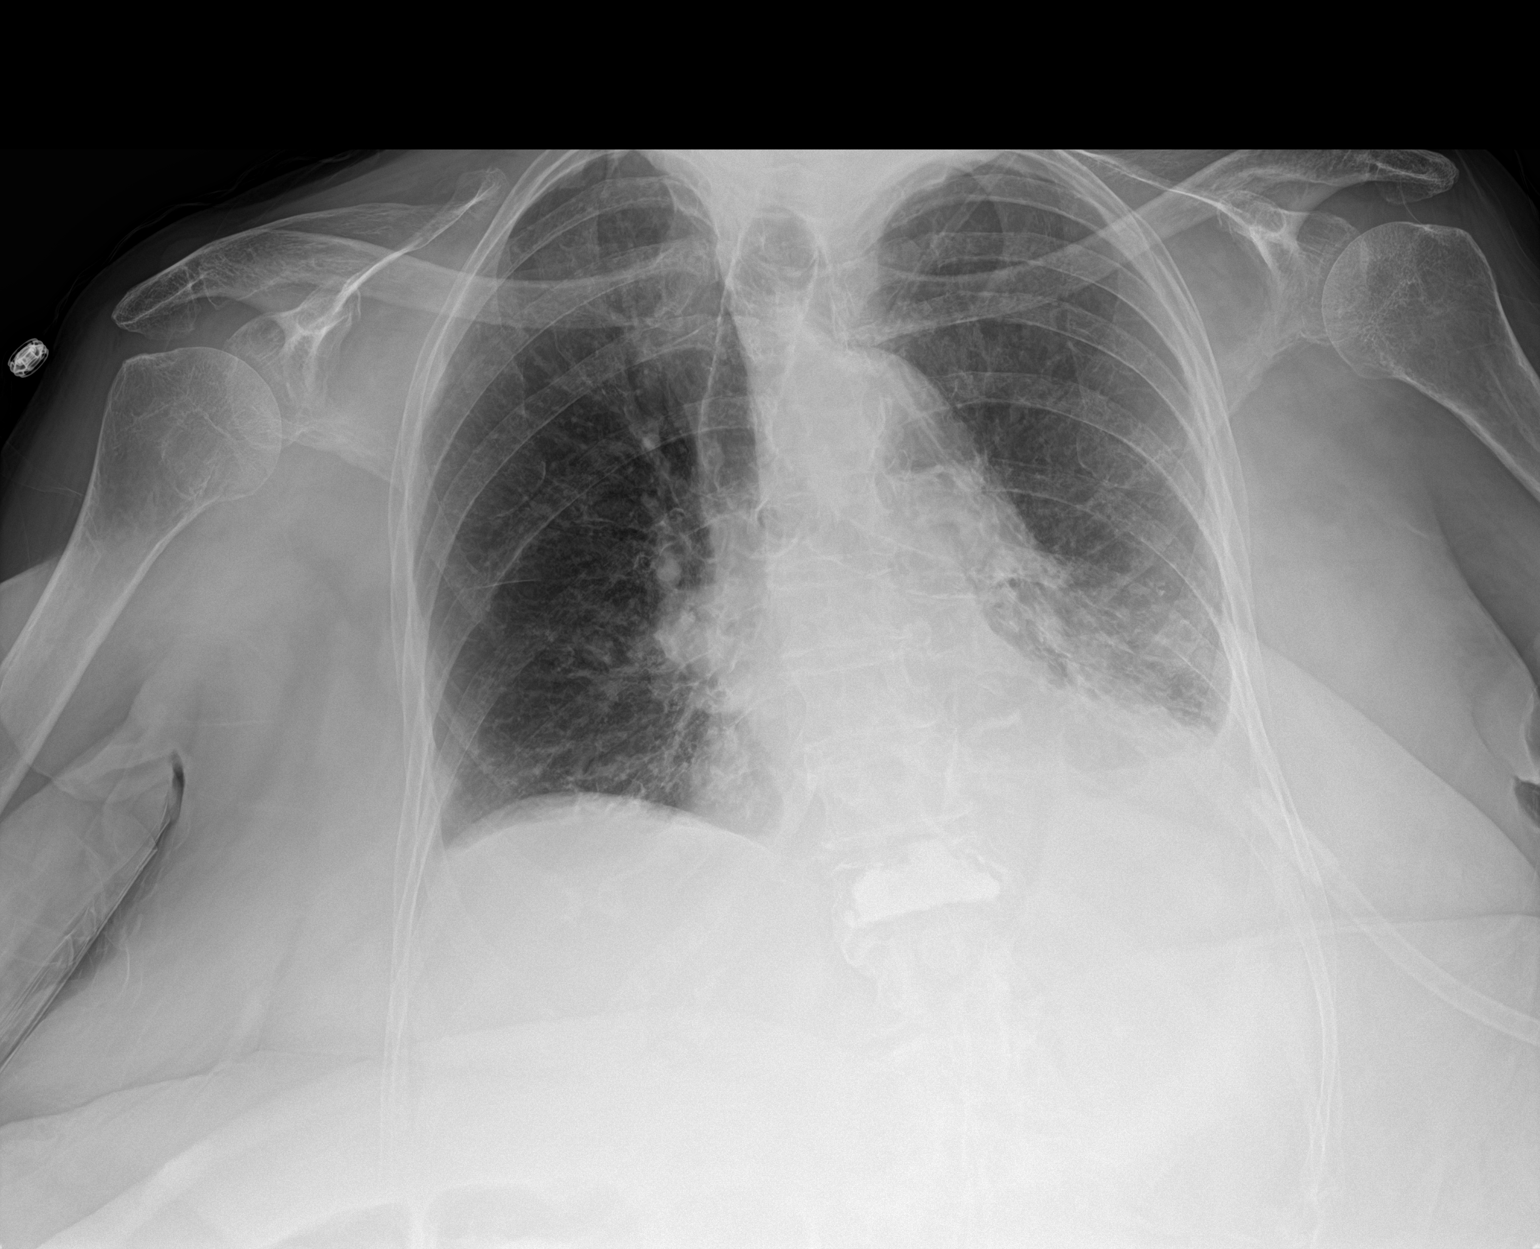

[1 of 1 positions shown; findings below may reference images not displayed]

FINDINGS: Upper normal heart size.

Atherosclerotic calcification aorta.

Mediastinal contours and pulmonary vascularity otherwise normal.

Small LEFT pleural effusion with atelectasis versus infiltrate LEFT
lower lobe, minimally improved.

Remaining lungs clear.

No pneumothorax.

Bones demineralized.
IMPRESSION: Small LEFT pleural effusion with slightly improved infiltrate versus
atelectasis LEFT lower lobe.
